# Patient Record
Sex: Female | Born: 1955 | Race: White | Hispanic: No | Smoking: Never smoker
Health system: Southern US, Community
[De-identification: ages and names within clinical notes are randomized; demographics above are authoritative.]

## PROBLEM LIST (undated history)

## (undated) DIAGNOSIS — K589 Irritable bowel syndrome without diarrhea: Secondary | ICD-10-CM

## (undated) DIAGNOSIS — T7840XA Allergy, unspecified, initial encounter: Secondary | ICD-10-CM

## (undated) DIAGNOSIS — Z460 Encounter for fitting and adjustment of spectacles and contact lenses: Secondary | ICD-10-CM

## (undated) DIAGNOSIS — G4733 Obstructive sleep apnea (adult) (pediatric): Secondary | ICD-10-CM

## (undated) DIAGNOSIS — Z9889 Other specified postprocedural states: Secondary | ICD-10-CM

## (undated) DIAGNOSIS — R112 Nausea with vomiting, unspecified: Secondary | ICD-10-CM

## (undated) DIAGNOSIS — E559 Vitamin D deficiency, unspecified: Secondary | ICD-10-CM

## (undated) DIAGNOSIS — K5790 Diverticulosis of intestine, part unspecified, without perforation or abscess without bleeding: Secondary | ICD-10-CM

## (undated) DIAGNOSIS — E785 Hyperlipidemia, unspecified: Secondary | ICD-10-CM

## (undated) DIAGNOSIS — Z79899 Other long term (current) drug therapy: Secondary | ICD-10-CM

## (undated) DIAGNOSIS — G629 Polyneuropathy, unspecified: Secondary | ICD-10-CM

## (undated) DIAGNOSIS — I10 Essential (primary) hypertension: Secondary | ICD-10-CM

## (undated) DIAGNOSIS — F419 Anxiety disorder, unspecified: Secondary | ICD-10-CM

## (undated) DIAGNOSIS — E790 Hyperuricemia without signs of inflammatory arthritis and tophaceous disease: Secondary | ICD-10-CM

## (undated) DIAGNOSIS — D126 Benign neoplasm of colon, unspecified: Secondary | ICD-10-CM

## (undated) DIAGNOSIS — K219 Gastro-esophageal reflux disease without esophagitis: Secondary | ICD-10-CM

## (undated) DIAGNOSIS — R7309 Other abnormal glucose: Secondary | ICD-10-CM

## (undated) HISTORY — DX: Other long term (current) drug therapy: Z79.899

## (undated) HISTORY — DX: Irritable bowel syndrome without diarrhea: K58.9

## (undated) HISTORY — PX: WISDOM TOOTH EXTRACTION: SHX21

## (undated) HISTORY — DX: Obstructive sleep apnea (adult) (pediatric): G47.33

## (undated) HISTORY — DX: Hyperlipidemia, unspecified: E78.5

## (undated) HISTORY — DX: Diverticulosis of intestine, part unspecified, without perforation or abscess without bleeding: K57.90

## (undated) HISTORY — DX: Allergy, unspecified, initial encounter: T78.40XA

## (undated) HISTORY — DX: Polyneuropathy, unspecified: G62.9

## (undated) HISTORY — DX: Vitamin D deficiency, unspecified: E55.9

## (undated) HISTORY — PX: FOOT SURGERY: SHX648

## (undated) HISTORY — PX: POLYPECTOMY: SHX149

## (undated) HISTORY — DX: Hyperuricemia without signs of inflammatory arthritis and tophaceous disease: E79.0

## (undated) HISTORY — DX: Essential (primary) hypertension: I10

## (undated) HISTORY — DX: Gastro-esophageal reflux disease without esophagitis: K21.9

## (undated) HISTORY — PX: COLONOSCOPY: SHX174

## (undated) HISTORY — DX: Anxiety disorder, unspecified: F41.9

## (undated) HISTORY — DX: Other abnormal glucose: R73.09

## (undated) HISTORY — PX: EYE SURGERY: SHX253

## (undated) HISTORY — PX: OTHER SURGICAL HISTORY: SHX169

## (undated) HISTORY — DX: Benign neoplasm of colon, unspecified: D12.6

---

## 1997-11-30 ENCOUNTER — Encounter: Admission: RE | Admit: 1997-11-30 | Discharge: 1998-02-28 | Payer: Self-pay | Admitting: Internal Medicine

## 1999-03-12 ENCOUNTER — Other Ambulatory Visit: Admission: RE | Admit: 1999-03-12 | Discharge: 1999-03-12 | Payer: Self-pay | Admitting: Obstetrics and Gynecology

## 1999-07-23 ENCOUNTER — Encounter: Admission: RE | Admit: 1999-07-23 | Discharge: 1999-07-23 | Payer: Self-pay | Admitting: Obstetrics and Gynecology

## 1999-07-23 ENCOUNTER — Encounter: Payer: Self-pay | Admitting: Obstetrics and Gynecology

## 1999-10-21 ENCOUNTER — Encounter: Payer: Self-pay | Admitting: Internal Medicine

## 1999-10-21 ENCOUNTER — Ambulatory Visit (HOSPITAL_COMMUNITY): Admission: RE | Admit: 1999-10-21 | Discharge: 1999-10-21 | Payer: Self-pay | Admitting: Internal Medicine

## 2000-03-12 ENCOUNTER — Other Ambulatory Visit: Admission: RE | Admit: 2000-03-12 | Discharge: 2000-03-12 | Payer: Self-pay | Admitting: Obstetrics and Gynecology

## 2000-07-13 ENCOUNTER — Encounter: Admission: RE | Admit: 2000-07-13 | Discharge: 2000-07-13 | Payer: Self-pay | Admitting: Obstetrics and Gynecology

## 2000-07-13 ENCOUNTER — Encounter: Payer: Self-pay | Admitting: Obstetrics and Gynecology

## 2000-07-22 ENCOUNTER — Encounter: Payer: Self-pay | Admitting: Obstetrics and Gynecology

## 2000-07-22 ENCOUNTER — Encounter: Admission: RE | Admit: 2000-07-22 | Discharge: 2000-07-22 | Payer: Self-pay | Admitting: Obstetrics and Gynecology

## 2001-03-16 ENCOUNTER — Other Ambulatory Visit: Admission: RE | Admit: 2001-03-16 | Discharge: 2001-03-16 | Payer: Self-pay | Admitting: Obstetrics and Gynecology

## 2001-03-31 ENCOUNTER — Emergency Department (HOSPITAL_COMMUNITY): Admission: EM | Admit: 2001-03-31 | Discharge: 2001-03-31 | Payer: Self-pay | Admitting: Emergency Medicine

## 2001-08-24 ENCOUNTER — Ambulatory Visit (HOSPITAL_COMMUNITY): Admission: RE | Admit: 2001-08-24 | Discharge: 2001-08-24 | Payer: Self-pay | Admitting: Anesthesiology

## 2001-08-24 ENCOUNTER — Encounter: Payer: Self-pay | Admitting: Internal Medicine

## 2002-06-15 ENCOUNTER — Other Ambulatory Visit: Admission: RE | Admit: 2002-06-15 | Discharge: 2002-06-15 | Payer: Self-pay | Admitting: Obstetrics and Gynecology

## 2002-07-21 ENCOUNTER — Encounter: Admission: RE | Admit: 2002-07-21 | Discharge: 2002-07-21 | Payer: Self-pay | Admitting: Obstetrics and Gynecology

## 2002-07-21 ENCOUNTER — Encounter: Payer: Self-pay | Admitting: Obstetrics and Gynecology

## 2003-09-12 ENCOUNTER — Encounter: Admission: RE | Admit: 2003-09-12 | Discharge: 2003-09-12 | Payer: Self-pay | Admitting: Obstetrics and Gynecology

## 2003-09-20 ENCOUNTER — Other Ambulatory Visit: Admission: RE | Admit: 2003-09-20 | Discharge: 2003-09-20 | Payer: Self-pay | Admitting: Obstetrics and Gynecology

## 2004-01-14 ENCOUNTER — Emergency Department (HOSPITAL_COMMUNITY): Admission: EM | Admit: 2004-01-14 | Discharge: 2004-01-14 | Payer: Self-pay | Admitting: Emergency Medicine

## 2004-08-20 ENCOUNTER — Encounter: Admission: RE | Admit: 2004-08-20 | Discharge: 2004-08-20 | Payer: Self-pay | Admitting: Obstetrics and Gynecology

## 2005-05-13 ENCOUNTER — Other Ambulatory Visit: Admission: RE | Admit: 2005-05-13 | Discharge: 2005-05-13 | Payer: Self-pay | Admitting: Obstetrics and Gynecology

## 2005-08-26 ENCOUNTER — Ambulatory Visit: Payer: Self-pay | Admitting: Gastroenterology

## 2005-09-09 ENCOUNTER — Ambulatory Visit: Payer: Self-pay | Admitting: Gastroenterology

## 2005-10-01 ENCOUNTER — Ambulatory Visit: Payer: Self-pay | Admitting: Hematology & Oncology

## 2005-10-10 LAB — CBC WITH DIFFERENTIAL/PLATELET
BASO%: 0.5 % (ref 0.0–2.0)
EOS%: 1.4 % (ref 0.0–7.0)
HGB: 13.1 g/dL (ref 11.6–15.9)
MCHC: 35.5 g/dL (ref 32.0–36.0)
NEUT%: 47.8 % (ref 39.6–76.8)
RBC: 3.98 10*6/uL (ref 3.70–5.32)
WBC: 12.5 10*3/uL — ABNORMAL HIGH (ref 3.9–10.0)
lymph#: 5.4 10*3/uL — ABNORMAL HIGH (ref 0.9–3.3)

## 2005-10-10 LAB — CHCC SMEAR

## 2005-10-15 ENCOUNTER — Encounter: Admission: RE | Admit: 2005-10-15 | Discharge: 2005-10-15 | Payer: Self-pay | Admitting: Obstetrics and Gynecology

## 2005-10-21 ENCOUNTER — Encounter: Admission: RE | Admit: 2005-10-21 | Discharge: 2005-10-21 | Payer: Self-pay | Admitting: Obstetrics and Gynecology

## 2005-11-07 ENCOUNTER — Encounter: Admission: RE | Admit: 2005-11-07 | Discharge: 2005-11-07 | Payer: Self-pay | Admitting: Internal Medicine

## 2006-03-16 ENCOUNTER — Ambulatory Visit: Payer: Self-pay | Admitting: Gastroenterology

## 2006-10-29 ENCOUNTER — Encounter: Admission: RE | Admit: 2006-10-29 | Discharge: 2006-10-29 | Payer: Self-pay | Admitting: Obstetrics and Gynecology

## 2006-11-24 ENCOUNTER — Ambulatory Visit (HOSPITAL_COMMUNITY): Admission: RE | Admit: 2006-11-24 | Discharge: 2006-11-24 | Payer: Self-pay | Admitting: Internal Medicine

## 2006-12-02 ENCOUNTER — Encounter: Admission: RE | Admit: 2006-12-02 | Discharge: 2006-12-02 | Payer: Self-pay | Admitting: Internal Medicine

## 2006-12-08 ENCOUNTER — Ambulatory Visit: Payer: Self-pay | Admitting: Gastroenterology

## 2006-12-08 LAB — CONVERTED CEMR LAB: Folate: >20 ng/mL

## 2006-12-09 ENCOUNTER — Encounter: Payer: Self-pay | Admitting: Gastroenterology

## 2006-12-09 LAB — CONVERTED CEMR LAB: Tissue Transglutaminase Ab, IgA: 0.5 units (ref <7)

## 2007-10-18 DIAGNOSIS — K589 Irritable bowel syndrome without diarrhea: Secondary | ICD-10-CM | POA: Insufficient documentation

## 2007-10-18 DIAGNOSIS — Z86010 Personal history of colonic polyps: Secondary | ICD-10-CM | POA: Insufficient documentation

## 2007-10-18 DIAGNOSIS — D126 Benign neoplasm of colon, unspecified: Secondary | ICD-10-CM

## 2007-10-18 HISTORY — DX: Irritable bowel syndrome, unspecified: K58.9

## 2007-10-18 HISTORY — DX: Benign neoplasm of colon, unspecified: D12.6

## 2007-11-25 ENCOUNTER — Encounter: Admission: RE | Admit: 2007-11-25 | Discharge: 2007-11-25 | Payer: Self-pay | Admitting: Obstetrics and Gynecology

## 2008-07-07 ENCOUNTER — Ambulatory Visit: Payer: Self-pay | Admitting: Hematology & Oncology

## 2008-07-21 LAB — CBC WITH DIFFERENTIAL (CANCER CENTER ONLY)
BASO#: 0.1 10*3/uL (ref 0.0–0.2)
BASO%: 1 % (ref 0.0–2.0)
EOS%: 2.7 % (ref 0.0–7.0)
HGB: 12.3 g/dL (ref 11.6–15.9)
LYMPH#: 5 10*3/uL — ABNORMAL HIGH (ref 0.9–3.3)
MCH: 31.8 pg (ref 26.0–34.0)
MCHC: 35.2 g/dL (ref 32.0–36.0)
MONO%: 4.3 % (ref 0.0–13.0)
NEUT#: 5.8 10*3/uL (ref 1.5–6.5)
NEUT%: 49.6 % (ref 39.6–80.0)
RDW: 10.9 % (ref 10.5–14.6)

## 2008-07-21 LAB — CHCC SATELLITE - SMEAR

## 2008-07-21 LAB — TECHNOLOGIST REVIEW CHCC SATELLITE

## 2008-07-27 LAB — JAK2 GENOTYPR

## 2008-09-11 ENCOUNTER — Ambulatory Visit: Payer: Self-pay | Admitting: Hematology & Oncology

## 2008-11-21 ENCOUNTER — Ambulatory Visit (HOSPITAL_COMMUNITY): Admission: RE | Admit: 2008-11-21 | Discharge: 2008-11-21 | Payer: Self-pay | Admitting: Obstetrics and Gynecology

## 2008-11-27 ENCOUNTER — Encounter: Admission: RE | Admit: 2008-11-27 | Discharge: 2008-11-27 | Payer: Self-pay | Admitting: Obstetrics and Gynecology

## 2009-12-03 ENCOUNTER — Encounter: Admission: RE | Admit: 2009-12-03 | Discharge: 2009-12-03 | Payer: Self-pay | Admitting: Obstetrics and Gynecology

## 2010-07-22 ENCOUNTER — Other Ambulatory Visit: Payer: Self-pay | Admitting: Obstetrics and Gynecology

## 2010-07-22 DIAGNOSIS — N6310 Unspecified lump in the right breast, unspecified quadrant: Secondary | ICD-10-CM

## 2010-07-30 ENCOUNTER — Other Ambulatory Visit: Payer: Self-pay | Admitting: Obstetrics and Gynecology

## 2010-07-30 ENCOUNTER — Ambulatory Visit
Admission: RE | Admit: 2010-07-30 | Discharge: 2010-07-30 | Disposition: A | Discharge status: Home / Self Care | Payer: 59 | Source: Ambulatory Visit | Attending: Obstetrics and Gynecology | Admitting: Obstetrics and Gynecology

## 2010-07-30 DIAGNOSIS — N6310 Unspecified lump in the right breast, unspecified quadrant: Secondary | ICD-10-CM

## 2010-11-05 NOTE — Assessment & Plan Note (Signed)
Katie Cox                         GASTROENTEROLOGY OFFICE NOTE   Katie Cox, Katie Cox                       MRN:          [id number]  DATE:12/08/2006                            DOB:          05-Jun-1956    Katie Cox is a 55 year old white female with chronic IBS who has been  seeing Dr. Corinda Gubler for close to 20 years.  Her last colonoscopy exam was  in March of 2007, and she did have a somewhat tortuous colon.   Her main complaint is one of excessive abdominal gas and bloating,  alternating diarrhea and constipation.  She had an inadequate trial of  Xifaxan in September of 2007 with 200 mg twice a day with no response.  She does have excessive flatus, but denies true reflux symptoms.  She  recently had upper GI series and ultrasound performed by Dr. Oneta Rack  that were negative.   She has had no anorexia, weight loss, but does complain of early  satiety, gas, and bloating.  She currently is having some loose, non-  bloody stools.  She has been on chronic estrogen therapy which has  recently been manipulated, and she has recently been placed on Nexium.  She also is on a diuretic, takes potassium supplementation.   I cannot see in reviewing her chart where she has ever had endoscopy or  small bowel biopsy.  She has had previous colon polyps resected in 2004.  She does have known lactose intolerance.   She denies any hepatobiliary complaints, anorexia, weight loss, or  systemic complaints such as fever, chills, skin rashes, joint pains, or  oral stomatitis.  She denies abuse of cigarettes, alcohol, or NSAIDs.   FAMILY HISTORY:  Noncontributory.   PHYSICAL EXAMINATION:  She is a healthy-appearing white female in no  distress appearing her stated age.  She weighs 185 pounds which is up some 10 pounds from her normal weight.  Blood pressure is 128/82 and pulse was 88 and regular.  I could not appreciate stigmata of chronic liver disease.  Her  abdominal exam was entirely benign without organomegaly, masses,  tenderness, or distension.  Bowel sounds were present, were  nonobstructive in quality.   ASSESSMENT:  I think Katie Cox has chronic irritable bowel syndrome,  but certainly may have a bacterial overgrowth component with inadequate  previous treatment.  She obviously has lactose intolerance.  I think we  need to screen her for celiac disease, especially since her family is  [place] in origin.   RECOMMENDATIONS:  1. Repeat Xifaxan 400 mg t.i.d. for 10 days along with Align probiotic      therapy.  2. Check celiac panel, carotene, and B12 level.  3. Continue other medications as per Dr. Oneta Rack.  4. Consider endoscopy, small bowel biopsy.     Katie Rea. Jarold Motto, MD, Caleen Essex, FAGA  Electronically Signed    DRP/MedQ  DD: 12/08/2006  DT: 12/08/2006  Job #: [id number]   cc:   Lucky Cowboy, M.D.

## 2010-11-08 NOTE — Assessment & Plan Note (Signed)
[facility]                           GASTROENTEROLOGY OFFICE NOTE   Katie Cox, Katie Cox                       MRN:          [id number]  DATE:03/16/2006                            DOB:          [birth year]    Leandrea comes in on September 24 as referred by Dr. Oneta Rack.  She is  complaining predominantly of some bloating and gas.  She states she has been  treated with some increased fiber and antispasmodics, but this has not  seemed to help.  She is lactose intolerant.  Has cut back on her lactose  usage.  I ask about sugar-free candy.  She does not really do this.  She  says she has some increasing constipation and has difficulty going to the  bathroom, and this is bothering her as well.  I did a colonoscopic  examination on her in March of 2007 because of her family history of her  father having colon cancer in his 30s.  The colonoscope examination revealed  only some minimal diverticula and otherwise was totally normal.   Otherwise, the patient does not complain of any other GI symptoms.  She has  had no blood in her stools.  Had no weight loss or anything else of  significance.   PAST MEDICAL HISTORY:  Only reveals some thyroid problems in the past, for  which she is treated by Dr. Oneta Rack.   SOCIAL HISTORY:  Negative.  She does not drink or smoke.  She works as an  Statistician.   REVIEW OF SYSTEMS:  Symptoms of fatigue and sleeping problems, and  occasional swelling of her legs.   PHYSICAL EXAMINATION:  She is 5 feet 8 inches.  Weighs 176.  Blood pressure  124/84, pulse 60 and regular.  NECK, HEART, ABDOMEN, EXTREMITIES:  Unremarkable except she did have some  slight tenderness to palpation of the left and right lower quadrants, but  nothing of great significance.  No bruits or rubs.  Inguinal area was  negative.   IMPRESSION:  1. Probably irritable bowel with a constipation component.  2. Status post thyroid surgery.  3. Mild  obesity.   RECOMMENDATIONS:  The patient to use Senokot with senna 2 at bedtime and 5  prunes in the morning.  I gave her some Xifaxan 200 mg take 1 b.i.d. for 10  days.  I gave her some samples.  Some Align should be taken along with it 1  a day.  Gave her some literature on gas and lactose-free diet and told her  to use some MiraLax if needed to get her bowels to move  every day.  I told her to return in 4-6 weeks if she is no better.  I think  a CT scan of her abdomen to be complete would be in order.                                   Ulyess Mort, MD   SML/MedQ  DD:  03/16/2006  DT:  03/18/2006  Job #:  [id number]   cc:   Lucky Cowboy, M.D.

## 2010-11-20 ENCOUNTER — Other Ambulatory Visit: Payer: Self-pay | Admitting: Obstetrics and Gynecology

## 2010-11-20 DIAGNOSIS — Z1231 Encounter for screening mammogram for malignant neoplasm of breast: Secondary | ICD-10-CM

## 2010-12-09 ENCOUNTER — Ambulatory Visit: Payer: 59

## 2010-12-20 ENCOUNTER — Ambulatory Visit: Payer: 59

## 2010-12-23 ENCOUNTER — Ambulatory Visit
Admission: RE | Admit: 2010-12-23 | Discharge: 2010-12-23 | Disposition: A | Discharge status: Home / Self Care | Payer: 59 | Source: Ambulatory Visit | Attending: Obstetrics and Gynecology | Admitting: Obstetrics and Gynecology

## 2010-12-23 DIAGNOSIS — Z1231 Encounter for screening mammogram for malignant neoplasm of breast: Secondary | ICD-10-CM

## 2011-11-25 ENCOUNTER — Other Ambulatory Visit: Payer: Self-pay | Admitting: Obstetrics and Gynecology

## 2011-11-25 DIAGNOSIS — Z1231 Encounter for screening mammogram for malignant neoplasm of breast: Secondary | ICD-10-CM

## 2011-12-31 ENCOUNTER — Ambulatory Visit: Payer: 59

## 2012-01-05 ENCOUNTER — Ambulatory Visit: Payer: 59

## 2012-01-16 ENCOUNTER — Ambulatory Visit
Admission: RE | Admit: 2012-01-16 | Discharge: 2012-01-16 | Disposition: A | Discharge status: Home / Self Care | Payer: 59 | Source: Ambulatory Visit | Attending: Obstetrics and Gynecology | Admitting: Obstetrics and Gynecology

## 2012-01-16 DIAGNOSIS — Z1231 Encounter for screening mammogram for malignant neoplasm of breast: Secondary | ICD-10-CM

## 2012-03-25 ENCOUNTER — Other Ambulatory Visit: Payer: Self-pay | Admitting: Internal Medicine

## 2012-03-25 DIAGNOSIS — R109 Unspecified abdominal pain: Secondary | ICD-10-CM

## 2012-04-01 ENCOUNTER — Ambulatory Visit
Admission: RE | Admit: 2012-04-01 | Discharge: 2012-04-01 | Disposition: A | Discharge status: Home / Self Care | Payer: 59 | Source: Ambulatory Visit | Attending: Internal Medicine | Admitting: Internal Medicine

## 2012-04-01 DIAGNOSIS — R109 Unspecified abdominal pain: Secondary | ICD-10-CM

## 2012-04-05 ENCOUNTER — Encounter (INDEPENDENT_AMBULATORY_CARE_PROVIDER_SITE_OTHER): Payer: Self-pay | Admitting: General Surgery

## 2012-04-06 ENCOUNTER — Ambulatory Visit (INDEPENDENT_AMBULATORY_CARE_PROVIDER_SITE_OTHER): Payer: 59 | Admitting: General Surgery

## 2012-04-06 ENCOUNTER — Encounter (INDEPENDENT_AMBULATORY_CARE_PROVIDER_SITE_OTHER): Payer: Self-pay | Admitting: General Surgery

## 2012-04-06 VITALS — BP 134/86 | HR 68 | Temp 97.3°F | Resp 14 | Ht 68.0 in | Wt 155.2 lb

## 2012-04-06 DIAGNOSIS — K8020 Calculus of gallbladder without cholecystitis without obstruction: Secondary | ICD-10-CM

## 2012-04-06 NOTE — Progress Notes (Signed)
Patient ID: Katie Cox, female   DOB: 12/04/1955, 56 y.o.   MRN: [id number]  Chief Complaint  Patient presents with  . Abdominal Pain    HPI Katie Cox is a 56 y.o. female.  Referred by Dr. McKeown HPI This is a 56-year-old female who is otherwise healthy. 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 Mostly this is constipation some occasional abdominal pain. She reports recently she began to develop constant epigastric, right upper quadrant pain associated with nausea this occurs all the time it occasionally radiates to her back and up into the right side of her chest. She has no emesis and describes nausea. She had no diarrhea associated with this. He makes her more nauseated and does worsen the pain. She also complains of some bloating and some tenderness in bilateral lower quadrants of her abdomen as well. There is been no changes in her bowel movements. She reports that she has a prior history of a colonoscopy that was without any abnormalities. She has undergone an ultrasound after developing the symptoms which shows the gallbladder having multiple gallstones. The remainder of this is normal. Her liver function tests I have today are also within normal limits. She comes in to discuss treatment for what sounds to be symptomatic cholelithiasis. Past Medical History  Diagnosis Date  . Hypertension   . Gallstones   . GERD (gastroesophageal reflux disease)   . Hyperlipidemia     Past Surgical History  Procedure Date  . Goiter removed fro thyroid   . Foot surgery     Family History  Problem Relation Age of Onset  . Breast cancer Maternal Aunt   . Colon cancer Father   . Heart disease Mother     Social History History  Substance Use Topics  . Smoking status: Never Smoker   . Smokeless tobacco: Not on file  . Alcohol Use: No    Allergies  Allergen Reactions  . Alprazolam   . Prednisone     Only high doses    Current Outpatient Prescriptions    Medication Sig Dispense Refill  . aspirin 81 MG tablet Take 81 mg by mouth daily.      . bumetanide (BUMEX) 2 MG tablet Take 2 mg by mouth daily.      . Cholecalciferol (VITAMIN D PO) Take by mouth.      . Cyanocobalamin (B-12 PO) Take by mouth.      . magnesium 30 MG tablet Take 30 mg by mouth 2 (two) times daily.      . Multiple Vitamin (MULTIVITAMIN) tablet Take 1 tablet by mouth daily.      . potassium chloride (KLOR-CON) 20 MEQ packet Take 20 mEq by mouth 2 (two) times daily.      . ranitidine (ZANTAC) 300 MG tablet Take 300 mg by mouth at bedtime.        Review of Systems Review of Systems  Constitutional: Negative for fever, chills and unexpected weight change.  HENT: Negative for hearing loss, congestion, sore throat, trouble swallowing and voice change.   Eyes: Negative for visual disturbance.  Respiratory: Negative for cough and wheezing.   Cardiovascular: Negative for chest pain, palpitations and leg swelling.  Gastrointestinal: Positive for nausea, abdominal pain and constipation. Negative for vomiting, diarrhea, blood in stool, abdominal distention and anal bleeding.  Genitourinary: Negative for hematuria, vaginal bleeding and difficulty urinating.  Musculoskeletal: Negative for arthralgias.  Skin: Negative for rash and wound.  Neurological: Negative for seizures,   syncope and headaches.  Hematological: Negative for adenopathy. Does not bruise/bleed easily.  Psychiatric/Behavioral: Negative for confusion.    Blood pressure 134/86, pulse 68, temperature 97.3 F (36.3 C), resp. rate 14, height 5' 8" (1.727 m), weight 155 lb 3.2 oz (70.398 kg).  Physical Exam Physical Exam  Vitals reviewed. Constitutional: She appears well-developed and well-nourished.  Eyes: No scleral icterus.  Cardiovascular: Normal rate, regular rhythm and normal heart sounds.   Pulmonary/Chest: Effort normal and breath sounds normal. She has no wheezes. She has no rales.  Abdominal: Soft. Normal  appearance. There is tenderness in the right upper quadrant, right lower quadrant and left lower quadrant. No hernia.  Lymphadenopathy:    She has no cervical adenopathy.    Data Reviewed History: Post prandial nausea and pain  Findings: Gallbladder is visualized in multiple projections.  Within the gallbladder, there are multiple echogenic foci which  move and shadow consistent with gallstones. There is no  gallbladder wall thickening or pericholecystic fluid collection.  There is no intrahepatic, common hepatic, or common bile duct  dilatation. Pancreas is largely obscured by gas. The small  portion of the pancreas which are visualized appear grossly normal.  No focal liver lesions are identified.  Spleen is normal in size and homogeneous echotexture. Kidneys  appear somewhat echogenic bilaterally but otherwise appear normal.  There is no ascites. There is atherosclerotic change in the aorta  but no aneurysm. Inferior vena cava appears of normal.  Conclusion: Cholelithiasis.  Pancreas largely obscured by gas.  Atherosclerotic change in the aorta.  Kidneys appear somewhat echogenic bilaterally. Question underlying  medical renal disease. Kidneys otherwise appear normal.  Study otherwise unremarkable.   Assessment    Symptomatic cholelithiasis    Plan    I discussed the procedure in detail.    We discussed the risks and benefits of a laparoscopic cholecystectomy and possible cholangiogram including, but not limited to bleeding, infection, injury to surrounding structures such as the intestine or liver, bile leak, retained gallstones, need to convert to an open procedure, prolonged diarrhea, blood clots such as  DVT, common bile duct injury, anesthesia risks, and possible need for additional procedures.  The likelihood of improvement in symptoms and return to the patient's normal status is good. We discussed the typical post-operative recovery course.         Katie Cox 04/06/2012, 3:19 PM    

## 2012-04-08 ENCOUNTER — Encounter (HOSPITAL_BASED_OUTPATIENT_CLINIC_OR_DEPARTMENT_OTHER): Payer: Self-pay | Admitting: *Deleted

## 2012-04-08 NOTE — Progress Notes (Signed)
To come in for [facility] labs-had ekg at pcp-will get copy

## 2012-04-09 ENCOUNTER — Encounter (HOSPITAL_BASED_OUTPATIENT_CLINIC_OR_DEPARTMENT_OTHER)
Admission: RE | Admit: 2012-04-09 | Discharge: 2012-04-09 | Disposition: A | Discharge status: Home / Self Care | Payer: 59 | Source: Ambulatory Visit | Attending: General Surgery | Admitting: General Surgery

## 2012-04-09 LAB — CBC WITH DIFFERENTIAL/PLATELET
Basophils Absolute: 0.1 10*3/uL (ref 0.0–0.1)
Basophils Relative: 1 % (ref 0–1)
Eosinophils Relative: 1 % (ref 0–5)
HCT: 41.4 % (ref 36.0–46.0)
MCHC: 36.7 g/dL — ABNORMAL HIGH (ref 30.0–36.0)
MCV: 90.2 fL (ref 78.0–100.0)
Monocytes Absolute: 0.8 10*3/uL (ref 0.1–1.0)
RDW: 12.3 % (ref 11.5–15.5)

## 2012-04-09 LAB — COMPREHENSIVE METABOLIC PANEL
AST: 30 U/L (ref 0–37)
Albumin: 4.4 g/dL (ref 3.5–5.2)
Calcium: 10 mg/dL (ref 8.4–10.5)
Creatinine, Ser: 0.93 mg/dL (ref 0.50–1.10)
Total Protein: 8.2 g/dL (ref 6.0–8.3)

## 2012-04-14 ENCOUNTER — Encounter (HOSPITAL_BASED_OUTPATIENT_CLINIC_OR_DEPARTMENT_OTHER): Payer: Self-pay | Admitting: Certified Registered Nurse Anesthetist

## 2012-04-14 ENCOUNTER — Ambulatory Visit (HOSPITAL_BASED_OUTPATIENT_CLINIC_OR_DEPARTMENT_OTHER): Payer: 59 | Admitting: Certified Registered Nurse Anesthetist

## 2012-04-14 ENCOUNTER — Encounter (HOSPITAL_BASED_OUTPATIENT_CLINIC_OR_DEPARTMENT_OTHER)
Admission: RE | Disposition: A | Discharge status: Home / Self Care | Payer: Self-pay | Source: Ambulatory Visit | Attending: General Surgery

## 2012-04-14 ENCOUNTER — Ambulatory Visit (HOSPITAL_BASED_OUTPATIENT_CLINIC_OR_DEPARTMENT_OTHER)
Admission: RE | Admit: 2012-04-14 | Discharge: 2012-04-14 | Disposition: A | Discharge status: Home / Self Care | Payer: 59 | Source: Ambulatory Visit | Attending: General Surgery | Admitting: General Surgery

## 2012-04-14 ENCOUNTER — Encounter (HOSPITAL_BASED_OUTPATIENT_CLINIC_OR_DEPARTMENT_OTHER): Payer: Self-pay

## 2012-04-14 DIAGNOSIS — K8010 Calculus of gallbladder with chronic cholecystitis without obstruction: Secondary | ICD-10-CM | POA: Insufficient documentation

## 2012-04-14 DIAGNOSIS — Z01812 Encounter for preprocedural laboratory examination: Secondary | ICD-10-CM | POA: Insufficient documentation

## 2012-04-14 DIAGNOSIS — E785 Hyperlipidemia, unspecified: Secondary | ICD-10-CM | POA: Insufficient documentation

## 2012-04-14 DIAGNOSIS — K219 Gastro-esophageal reflux disease without esophagitis: Secondary | ICD-10-CM | POA: Insufficient documentation

## 2012-04-14 HISTORY — PX: CHOLECYSTECTOMY: SHX55

## 2012-04-14 HISTORY — DX: Encounter for fitting and adjustment of spectacles and contact lenses: Z46.0

## 2012-04-14 SURGERY — LAPAROSCOPIC CHOLECYSTECTOMY
Anesthesia: General | Site: Abdomen | Wound class: Contaminated

## 2012-04-14 MED ORDER — LACTATED RINGERS IV SOLN
INTRAVENOUS | Status: DC
  Administered 2012-04-14 (×2): via INTRAVENOUS

## 2012-04-14 MED ORDER — KETOROLAC TROMETHAMINE 30 MG/ML IJ SOLN
INTRAMUSCULAR | Status: DC | PRN
  Administered 2012-04-14: 30 mg via INTRAVENOUS

## 2012-04-14 MED ORDER — ACETAMINOPHEN 10 MG/ML IV SOLN
1000.0000 mg | Freq: Once | INTRAVENOUS | Status: AC
  Administered 2012-04-14: 1000 mg via INTRAVENOUS

## 2012-04-14 MED ORDER — SODIUM CHLORIDE 0.9 % IR SOLN
Status: DC | PRN
  Administered 2012-04-14: 1

## 2012-04-14 MED ORDER — MIDAZOLAM HCL 5 MG/5ML IJ SOLN
INTRAMUSCULAR | Status: DC | PRN
  Administered 2012-04-14: 1 mg via INTRAVENOUS

## 2012-04-14 MED ORDER — GLYCOPYRROLATE 0.2 MG/ML IJ SOLN
INTRAMUSCULAR | Status: DC | PRN
  Administered 2012-04-14: 0.6 mg via INTRAVENOUS

## 2012-04-14 MED ORDER — PROMETHAZINE HCL 25 MG/ML IJ SOLN
6.2500 mg | Freq: Four times a day (QID) | INTRAMUSCULAR | Status: DC | PRN
  Administered 2012-04-14: 6.25 mg via INTRAVENOUS

## 2012-04-14 MED ORDER — OXYCODONE HCL 5 MG/5ML PO SOLN: 5.0000 mg | Freq: Once | ORAL | Status: DC | PRN

## 2012-04-14 MED ORDER — NEOSTIGMINE METHYLSULFATE 1 MG/ML IJ SOLN
INTRAMUSCULAR | Status: DC | PRN
  Administered 2012-04-14: 3.5 mg via INTRAVENOUS

## 2012-04-14 MED ORDER — OXYCODONE HCL 5 MG PO TABS: 5.0000 mg | ORAL_TABLET | Freq: Once | ORAL | Status: DC | PRN

## 2012-04-14 MED ORDER — DEXTROSE 5 % IV SOLN
2.0000 g | INTRAVENOUS | Status: AC
  Administered 2012-04-14: 2 g via INTRAVENOUS

## 2012-04-14 MED ORDER — OXYCODONE-ACETAMINOPHEN 5-325 MG PO TABS: 1.0000 | ORAL_TABLET | ORAL | Status: DC | PRN

## 2012-04-14 MED ORDER — PROPOFOL 10 MG/ML IV BOLUS
INTRAVENOUS | Status: DC | PRN
  Administered 2012-04-14: 200 mg via INTRAVENOUS

## 2012-04-14 MED ORDER — BUPIVACAINE-EPINEPHRINE 0.25% -1:200000 IJ SOLN
INTRAMUSCULAR | Status: DC | PRN
  Administered 2012-04-14: 10 mL

## 2012-04-14 MED ORDER — ONDANSETRON HCL 4 MG/2ML IJ SOLN
INTRAMUSCULAR | Status: DC | PRN
  Administered 2012-04-14: 4 mg via INTRAVENOUS
  Administered 2012-04-14: 2 mg via INTRAVENOUS

## 2012-04-14 MED ORDER — SCOPOLAMINE 1 MG/3DAYS TD PT72
1.0000 | MEDICATED_PATCH | TRANSDERMAL | Status: DC
  Administered 2012-04-14: 1.5 mg via TRANSDERMAL

## 2012-04-14 MED ORDER — DEXAMETHASONE SODIUM PHOSPHATE 4 MG/ML IJ SOLN
INTRAMUSCULAR | Status: DC | PRN
  Administered 2012-04-14: 10 mg via INTRAVENOUS

## 2012-04-14 MED ORDER — ROCURONIUM BROMIDE 100 MG/10ML IV SOLN
INTRAVENOUS | Status: DC | PRN
  Administered 2012-04-14: 35 mg via INTRAVENOUS

## 2012-04-14 MED ORDER — FENTANYL CITRATE 0.05 MG/ML IJ SOLN
INTRAMUSCULAR | Status: DC | PRN
  Administered 2012-04-14: 50 ug via INTRAVENOUS

## 2012-04-14 MED ORDER — HYDROMORPHONE HCL PF 1 MG/ML IJ SOLN
0.2500 mg | INTRAMUSCULAR | Status: DC | PRN
  Administered 2012-04-14 (×2): 0.5 mg via INTRAVENOUS

## 2012-04-14 MED ORDER — LIDOCAINE HCL (CARDIAC) 20 MG/ML IV SOLN
INTRAVENOUS | Status: DC | PRN
  Administered 2012-04-14: 60 mg via INTRAVENOUS

## 2012-04-14 SURGICAL SUPPLY — 40 items
APPLIER CLIP 5 13 M/L LIGAMAX5 (MISCELLANEOUS) ×2
BLADE SURG ROTATE 9660 (MISCELLANEOUS) IMPLANT
CANISTER SUCTION 2500CC (MISCELLANEOUS) ×2 IMPLANT
CHLORAPREP W/TINT 26ML (MISCELLANEOUS) ×2 IMPLANT
CLIP APPLIE 5 13 M/L LIGAMAX5 (MISCELLANEOUS) ×1 IMPLANT
CLOTH BEACON ORANGE TIMEOUT ST (SAFETY) ×2 IMPLANT
COVER MAYO STAND STRL (DRAPES) IMPLANT
DECANTER SPIKE VIAL GLASS SM (MISCELLANEOUS) ×2 IMPLANT
DERMABOND ADVANCED (GAUZE/BANDAGES/DRESSINGS) ×1
DERMABOND ADVANCED .7 DNX12 (GAUZE/BANDAGES/DRESSINGS) ×1 IMPLANT
DRAPE C-ARM 42X72 X-RAY (DRAPES) IMPLANT
ELECT REM PT RETURN 9FT ADLT (ELECTROSURGICAL) ×2
ELECTRODE REM PT RTRN 9FT ADLT (ELECTROSURGICAL) ×1 IMPLANT
GLOVE BIO SURGEON STRL SZ7 (GLOVE) ×2 IMPLANT
GLOVE BIOGEL PI IND STRL 6.5 (GLOVE) IMPLANT
GLOVE BIOGEL PI IND STRL 7.0 (GLOVE) ×1 IMPLANT
GLOVE BIOGEL PI IND STRL 7.5 (GLOVE) ×1 IMPLANT
GLOVE BIOGEL PI INDICATOR 6.5 (GLOVE)
GLOVE BIOGEL PI INDICATOR 7.0 (GLOVE) ×1
GLOVE BIOGEL PI INDICATOR 7.5 (GLOVE) ×1
GLOVE ECLIPSE 6.5 STRL STRAW (GLOVE) ×2 IMPLANT
GLOVE SURG SS PI 7.0 STRL IVOR (GLOVE) ×4 IMPLANT
GOWN STRL NON-REIN LRG LVL3 (GOWN DISPOSABLE) ×8 IMPLANT
HEMOSTAT SNOW SURGICEL 2X4 (HEMOSTASIS) IMPLANT
NS IRRIG 1000ML POUR BTL (IV SOLUTION) ×2 IMPLANT
PACK BASIN DAY SURGERY FS (CUSTOM PROCEDURE TRAY) ×2 IMPLANT
POUCH SPECIMEN RETRIEVAL 10MM (ENDOMECHANICALS) ×2 IMPLANT
SCISSORS LAP 5X35 DISP (ENDOMECHANICALS) IMPLANT
SET CHOLANGIOGRAPH 5 50 .035 (SET/KITS/TRAYS/PACK) IMPLANT
SET IRRIG TUBING LAPAROSCOPIC (IRRIGATION / IRRIGATOR) ×2 IMPLANT
SLEEVE ENDOPATH XCEL 5M (ENDOMECHANICALS) ×4 IMPLANT
SLEEVE SCD COMPRESS KNEE MED (MISCELLANEOUS) ×2 IMPLANT
SPECIMEN JAR SMALL (MISCELLANEOUS) ×2 IMPLANT
SUT MNCRL AB 4-0 PS2 18 (SUTURE) ×2 IMPLANT
SUT VICRYL 0 UR6 27IN ABS (SUTURE) ×2 IMPLANT
TOWEL OR 17X24 6PK STRL BLUE (TOWEL DISPOSABLE) ×2 IMPLANT
TOWEL OR NON WOVEN STRL DISP B (DISPOSABLE) ×2 IMPLANT
TRAY LAPAROSCOPIC (CUSTOM PROCEDURE TRAY) ×2 IMPLANT
TROCAR XCEL BLUNT TIP 100MML (ENDOMECHANICALS) ×2 IMPLANT
TROCAR XCEL NON-BLD 5MMX100MML (ENDOMECHANICALS) ×2 IMPLANT

## 2012-04-14 NOTE — Op Note (Signed)
Preoperative diagnosis: Symptomatic cholelithiasis Postoperative diagnosis: Same as above Procedure: Laparoscopic cholecystectomy Surgeon: Dr. Harden Mo Anesthesia: Gen. Endotracheal Estimated blood loss: Minimal Complications: None Drains: None Specimens: Gallbladder and contents to pathology Sponge and needle count correct x2 at end of operation Disposition to recovery in stable condition  Indications: This is a 56 year old female who was noted to have abdominal pain and gallstones on ultrasound. This appeared to be related to symptomatic cholelithiasis. Her liver function tests were all within normal limits. She and I discussed a laparoscopic cholecystectomy and the risks and benefits associated with that.  Procedure: After informed consent was obtained the patient was taken to the operative room. She was administered 2 g of intravenous cefoxitin. She had sequential compression devices placed on her legs prior to beginning. She was then placed under general endotracheal anesthesia without complication. Her abdomen was then prepped and draped in the standard sterile surgical fashion. A surgical timeout was then performed.  I infiltrated Marcaine below her umbilicus. I then made a vertical incision. I grasped her fascia and entered sharply. Her peritoneum was entered bluntly. I then placed the 0 Vicryl pursestring suture to the fascia. I inserted a Hassan trocar and insufflated the abdomen to 15 mm mercury pressure. I then inserted 3 further 5 mm trocars in the epigastrium and right abdomen under direct vision after infiltration with local anesthetic without complication. I then retracted the gallbladder cephalad and lateral. I dissected out her triangle and clearly obtained the critical view of safety. I then clipped the duct and artery and divided them. I left 2 clips in position on both of these. I then removed the gallbladder from the liver bed. This was somewhat adherent and I did spill a  small amount of bile during this. The gallbladder was then removed from the liver bed. It was placed in an Endo Catch bag and removed from the umbilicus. Irrigation was performed until this was clear. Hemostasis was obtained. I then removed the Hasson trocar and tied down my umbilical stitch. This completely obliterated the defect. There was no evidence of an entry injury. I then desufflated the abdomen removed all trocars. I closed these with 4 Monocryl, and Dermabond, and Steri-Strips. She tolerated this well was extubated and transferred to the recovery room in stable condition.

## 2012-04-14 NOTE — Anesthesia Preprocedure Evaluation (Signed)
Anesthesia Evaluation  Patient identified by MRN, date of birth, ID band Patient awake    Reviewed: Allergy & Precautions, H&P , NPO status , Patient's Chart, lab work & pertinent test results  Airway Mallampati: II TM Distance: >3 FB Neck ROM: Full    Dental No notable dental hx. (+) Teeth Intact and Dental Advisory Given   Pulmonary neg pulmonary ROS,    Pulmonary exam normal       Cardiovascular hypertension, On Medications     Neuro/Psych negative neurological ROS  negative psych ROS   GI/Hepatic Neg liver ROS, GERD-  Medicated,  Endo/Other  negative endocrine ROS  Renal/GU negative Renal ROS  negative genitourinary   Musculoskeletal   Abdominal   Peds  Hematology negative hematology ROS (+)   Anesthesia Other Findings   Reproductive/Obstetrics negative OB ROS                           Anesthesia Physical Anesthesia Plan  ASA: II  Anesthesia Plan: General   Post-op Pain Management:    Induction: Intravenous  Airway Management Planned: Oral ETT  Additional Equipment:   Intra-op Plan:   Post-operative Plan: Extubation in OR  Informed Consent: I have reviewed the patients History and Physical, chart, labs and discussed the procedure including the risks, benefits and alternatives for the proposed anesthesia with the patient or authorized representative who has indicated his/her understanding and acceptance.   Dental advisory given  Plan Discussed with: CRNA and Surgeon  Anesthesia Plan Comments:         Anesthesia Quick Evaluation

## 2012-04-14 NOTE — Anesthesia Procedure Notes (Signed)
Procedure Name: Intubation Date/Time: 04/14/2012 8:46 AM Performed by: Dacey Milberger D Pre-anesthesia Checklist: Patient identified, Emergency Drugs available, Suction available and Patient being monitored Patient Re-evaluated:Patient Re-evaluated prior to inductionOxygen Delivery Method: Circle System Utilized Preoxygenation: Pre-oxygenation with 100% oxygen Intubation Type: IV induction Ventilation: Mask ventilation without difficulty Laryngoscope Size: Mac and 3 Grade View: Grade I Tube type: Oral Tube size: 7.0 mm Number of attempts: 1 Airway Equipment and Method: stylet and LTA kit utilized Placement Confirmation: ETT inserted through vocal cords under direct vision,  positive ETCO2 and breath sounds checked- equal and bilateral Secured at: 21 cm Tube secured with: Tape Dental Injury: Teeth and Oropharynx as per pre-operative assessment

## 2012-04-14 NOTE — Transfer of Care (Signed)
Immediate Anesthesia Transfer of Care Note  Patient: Katie Cox  Procedure(s) Performed: Procedure(s) (LRB) with comments: LAPAROSCOPIC CHOLECYSTECTOMY (N/A)  Patient Location: PACU  Anesthesia Type: General  Level of Consciousness: awake, alert , oriented and patient cooperative  Airway & Oxygen Therapy: Patient Spontanous Breathing and Patient connected to face mask oxygen  Post-op Assessment: Report given to PACU RN and Post -op Vital signs reviewed and stable  Post vital signs: Reviewed and stable  Complications: No apparent anesthesia complications

## 2012-04-14 NOTE — H&P (View-Only) (Signed)
Patient ID: Katie Cox, female   DOB: Jul 07, 1955, 56 y.o.   MRN: [id number]  Chief Complaint  Patient presents with  . Abdominal Pain    HPI Katie Cox is a 56 y.o. female.  Referred by Dr. Oneta Rack HPI This is a 56 year old female who is otherwise healthy. 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 Mostly this is constipation some occasional abdominal pain. She reports recently she began to develop constant epigastric, right upper quadrant pain associated with nausea this occurs all the time it occasionally radiates to her back and up into the right side of her chest. She has no emesis and describes nausea. She had no diarrhea associated with this. He makes her more nauseated and does worsen the pain. She also complains of some bloating and some tenderness in bilateral lower quadrants of her abdomen as well. There is been no changes in her bowel movements. She reports that she has a prior history of a colonoscopy that was without any abnormalities. She has undergone an ultrasound after developing the symptoms which shows the gallbladder having multiple gallstones. The remainder of this is normal. Her liver function tests I have today are also within normal limits. She comes in to discuss treatment for what sounds to be symptomatic cholelithiasis. Past Medical History  Diagnosis Date  . Hypertension   . Gallstones   . GERD (gastroesophageal reflux disease)   . Hyperlipidemia     Past Surgical History  Procedure Date  . Goiter removed fro thyroid   . Foot surgery     Family History  Problem Relation Age of Onset  . Breast cancer Maternal Aunt   . Colon cancer Father   . Heart disease Mother     Social History History  Substance Use Topics  . Smoking status: Never Smoker   . Smokeless tobacco: Not on file  . Alcohol Use: No    Allergies  Allergen Reactions  . Alprazolam   . Prednisone     Only high doses    Current Outpatient Prescriptions    Medication Sig Dispense Refill  . aspirin 81 MG tablet Take 81 mg by mouth daily.      . bumetanide (BUMEX) 2 MG tablet Take 2 mg by mouth daily.      . Cholecalciferol (VITAMIN D PO) Take by mouth.      . Cyanocobalamin (B-12 PO) Take by mouth.      . magnesium 30 MG tablet Take 30 mg by mouth 2 (two) times daily.      . Multiple Vitamin (MULTIVITAMIN) tablet Take 1 tablet by mouth daily.      . potassium chloride (KLOR-CON) 20 MEQ packet Take 20 mEq by mouth 2 (two) times daily.      . ranitidine (ZANTAC) 300 MG tablet Take 300 mg by mouth at bedtime.        Review of Systems Review of Systems  Constitutional: Negative for fever, chills and unexpected weight change.  HENT: Negative for hearing loss, congestion, sore throat, trouble swallowing and voice change.   Eyes: Negative for visual disturbance.  Respiratory: Negative for cough and wheezing.   Cardiovascular: Negative for chest pain, palpitations and leg swelling.  Gastrointestinal: Positive for nausea, abdominal pain and constipation. Negative for vomiting, diarrhea, blood in stool, abdominal distention and anal bleeding.  Genitourinary: Negative for hematuria, vaginal bleeding and difficulty urinating.  Musculoskeletal: Negative for arthralgias.  Skin: Negative for rash and wound.  Neurological: Negative for seizures,  syncope and headaches.  Hematological: Negative for adenopathy. Does not bruise/bleed easily.  Psychiatric/Behavioral: Negative for confusion.    Blood pressure 134/86, pulse 68, temperature 97.3 F (36.3 C), resp. rate 14, height 5\' 8"  (1.727 m), weight 155 lb 3.2 oz (70.398 kg).  Physical Exam Physical Exam  Vitals reviewed. Constitutional: She appears well-developed and well-nourished.  Eyes: No scleral icterus.  Cardiovascular: Normal rate, regular rhythm and normal heart sounds.   Pulmonary/Chest: Effort normal and breath sounds normal. She has no wheezes. She has no rales.  Abdominal: Soft. Normal  appearance. There is tenderness in the right upper quadrant, right lower quadrant and left lower quadrant. No hernia.  Lymphadenopathy:    She has no cervical adenopathy.    Data Reviewed History: Post prandial nausea and pain  Findings: Gallbladder is visualized in multiple projections.  Within the gallbladder, there are multiple echogenic foci which  move and shadow consistent with gallstones. There is no  gallbladder wall thickening or pericholecystic fluid collection.  There is no intrahepatic, common hepatic, or common bile duct  dilatation. Pancreas is largely obscured by gas. The small  portion of the pancreas which are visualized appear grossly normal.  No focal liver lesions are identified.  Spleen is normal in size and homogeneous echotexture. Kidneys  appear somewhat echogenic bilaterally but otherwise appear normal.  There is no ascites. There is atherosclerotic change in the aorta  but no aneurysm. Inferior vena cava appears of normal.  Conclusion: Cholelithiasis.  Pancreas largely obscured by gas.  Atherosclerotic change in the aorta.  Kidneys appear somewhat echogenic bilaterally. Question underlying  medical renal disease. Kidneys otherwise appear normal.  Study otherwise unremarkable.   Assessment    Symptomatic cholelithiasis    Plan    I discussed the procedure in detail.    We discussed the risks and benefits of a laparoscopic cholecystectomy and possible cholangiogram including, but not limited to bleeding, infection, injury to surrounding structures such as the intestine or liver, bile leak, retained gallstones, need to convert to an open procedure, prolonged diarrhea, blood clots such as  DVT, common bile duct injury, anesthesia risks, and possible need for additional procedures.  The likelihood of improvement in symptoms and return to the patient's normal status is good. We discussed the typical post-operative recovery course.         Frederick Marro 04/06/2012, 3:19 PM

## 2012-04-14 NOTE — Anesthesia Postprocedure Evaluation (Signed)
  Anesthesia Post-op Note  Patient: Katie Cox  Procedure(s) Performed: Procedure(s) (LRB) with comments: LAPAROSCOPIC CHOLECYSTECTOMY (N/A)  Patient Location: PACU  Anesthesia Type: General  Level of Consciousness: awake and alert   Airway and Oxygen Therapy: Patient Spontanous Breathing  Post-op Pain: mild  Post-op Assessment: Post-op Vital signs reviewed, Patient's Cardiovascular Status Stable, Respiratory Function Stable and Patent Airway  Post-op Vital Signs: Reviewed and stable  Complications: No apparent anesthesia complications

## 2012-04-14 NOTE — Interval H&P Note (Signed)
History and Physical Interval Note:  04/14/2012 8:32 AM  Katie Cox  has presented today for surgery, with the diagnosis of gallstones  The various methods of treatment have been discussed with the patient and family. After consideration of risks, benefits and other options for treatment, the patient has consented to  Procedure(s) (LRB) with comments: LAPAROSCOPIC CHOLECYSTECTOMY (N/A) as a surgical intervention .  The patient's history has been reviewed, patient examined, no change in status, stable for surgery.  I have reviewed the patient's chart and labs.  Questions were answered to the patient's satisfaction.     Mylo Choi

## 2012-04-15 ENCOUNTER — Telehealth (INDEPENDENT_AMBULATORY_CARE_PROVIDER_SITE_OTHER): Payer: Self-pay | Admitting: General Surgery

## 2012-04-15 ENCOUNTER — Ambulatory Visit (INDEPENDENT_AMBULATORY_CARE_PROVIDER_SITE_OTHER): Payer: 59 | Admitting: General Surgery

## 2012-04-15 ENCOUNTER — Encounter (HOSPITAL_BASED_OUTPATIENT_CLINIC_OR_DEPARTMENT_OTHER): Payer: Self-pay | Admitting: General Surgery

## 2012-04-15 NOTE — Telephone Encounter (Signed)
Returned pt's call and she wanted to know what her restrictions were.  I explained that we do not want her pushing, pulling, or lifting anything greater than 15 lbs for about 2 weeks.

## 2012-04-15 NOTE — Telephone Encounter (Signed)
Message copied by Littie Deeds on Thu Apr 15, 2012 10:49 AM ------      Message from: Cathi Roan      Created: Thu Apr 15, 2012  9:43 AM      Regarding: after surgery question      Contact: [phone]       Would like to speak to you about surgery. Dr. Dwain Sarna spoke with husband but she wants to speak with someone.

## 2012-04-16 ENCOUNTER — Telehealth (INDEPENDENT_AMBULATORY_CARE_PROVIDER_SITE_OTHER): Payer: Self-pay | Admitting: General Surgery

## 2012-04-16 NOTE — Telephone Encounter (Signed)
Spoke with patient. She had questions about how long to keep steri-strips in place and when she should shower. Patient advised to keep steri-strips in place 7-10 days and she can shower now, but she isn't to submerge wounds in a bath or scrub the incisions. She will call back with any additional questions.

## 2012-04-16 NOTE — Telephone Encounter (Signed)
Message copied by Liliana Cline on Fri Apr 16, 2012 11:04 AM ------      Message from: Marnette Burgess      Created: Fri Apr 16, 2012  9:44 AM      Contact: [phone]       Patient of Dr. Mauri Reading, forgot to ask you a question yesterday on the types of bandages she is using, please call.

## 2012-04-19 ENCOUNTER — Telehealth (INDEPENDENT_AMBULATORY_CARE_PROVIDER_SITE_OTHER): Payer: Self-pay | Admitting: General Surgery

## 2012-04-19 NOTE — Telephone Encounter (Signed)
Returned pt's call and she wanted to know how long she would feel bloated.  I explained that the bloating sensation is normal but I did want to know if she was having bowel movements.  She said it had been a couple of days since her last bowel movement.  I suggested she use MiraLax as directed to help her get to the point of having one soft bowel movement a day.  She also wanted to know why her umbilicus was so tender.  I explained that this is the incision where they remove the gallbladder from and that it is normal for it to be tender.  She wanted to know if we would write her a letter to return to work on 11/4.  I asked her to call me Friday 11/1 so I could make sure she was feeling better and that I would run it by Dr. Dwain Sarna.

## 2012-04-19 NOTE — Telephone Encounter (Signed)
Message copied by Littie Deeds on Mon Apr 19, 2012 10:21 AM ------      Message from: Zacarias Pontes      Created: Mon Apr 19, 2012  9:11 AM       Pt has questions from surgery...she didn't want to talk to triage but asked that you call when you can at [phone]

## 2012-04-23 ENCOUNTER — Encounter (INDEPENDENT_AMBULATORY_CARE_PROVIDER_SITE_OTHER): Payer: Self-pay | Admitting: General Surgery

## 2012-04-26 ENCOUNTER — Ambulatory Visit (INDEPENDENT_AMBULATORY_CARE_PROVIDER_SITE_OTHER): Payer: 59 | Admitting: General Surgery

## 2012-04-26 ENCOUNTER — Encounter (INDEPENDENT_AMBULATORY_CARE_PROVIDER_SITE_OTHER): Payer: Self-pay | Admitting: General Surgery

## 2012-04-26 VITALS — BP 128/64 | HR 60 | Temp 97.3°F | Resp 12 | Ht 68.0 in | Wt 154.8 lb

## 2012-04-26 DIAGNOSIS — Z09 Encounter for follow-up examination after completed treatment for conditions other than malignant neoplasm: Secondary | ICD-10-CM

## 2012-04-26 NOTE — Progress Notes (Signed)
Subjective:     Patient ID: Katie Cox, female   DOB: [birth year], 56 y.o.   MRN: [id number]  HPI This is a 56 year old female who I recently performed a laparoscopic cholecystectomy on. Her pathology returned as chronic cholecystitis, cholelithiasis, cholesterolosis. She has some nausea postoperatively but is otherwise doing well. She is having bowel movements.  Review of Systems     Objective:   Physical Exam Healing incisions without infection    Assessment:     Status post laparoscopic cholecystectomy    Plan:     I think she has a fairly normal postoperative course. I recommended to her that taking some over-the-counter Prilosec. If that does not help her nausea I asked her to call back this  week. Otherwise I think she would just keep getting better and I will plan on seeing her back in a month when she continues to have some issues.

## 2012-05-28 ENCOUNTER — Encounter (INDEPENDENT_AMBULATORY_CARE_PROVIDER_SITE_OTHER): Payer: 59 | Admitting: General Surgery

## 2012-06-28 ENCOUNTER — Encounter (INDEPENDENT_AMBULATORY_CARE_PROVIDER_SITE_OTHER): Payer: 59 | Admitting: General Surgery

## 2012-07-12 ENCOUNTER — Telehealth (INDEPENDENT_AMBULATORY_CARE_PROVIDER_SITE_OTHER): Payer: Self-pay | Admitting: General Surgery

## 2012-07-12 NOTE — Telephone Encounter (Signed)
I am not going to charge her no matter when it is for this.

## 2012-07-12 NOTE — Telephone Encounter (Signed)
Pt called in and informed us she had an appt w/ Dr. Dwain Sarna last week but that he was running an hour behind so she had to reschedule.  She got rescheduled to 07/29/12 but told that Elease Hashimoto, Dr. Doreen Salvage nurse, would call her to move it up.  She never received a call and wanted to see if it could be moved up so that it could be within her "postop" time frame where she isn't charged.  I informed her the soonest I could get her in would be 07/21/12 but that I would send this message to Dr. Dwain Sarna to see if we could work it out where we charge her as a post op so that she wouldn't have to pay a copay amount.

## 2012-07-13 ENCOUNTER — Telehealth (INDEPENDENT_AMBULATORY_CARE_PROVIDER_SITE_OTHER): Payer: Self-pay

## 2012-07-13 NOTE — Telephone Encounter (Signed)
LMOM for pt letting her know that we did receive her message about the r/s appt from the other day. The pt is scheduled for an appt on 1/29 with Dr Dwain Sarna and he replied that he would not charge anything except her po charge that will be applied. I advised pt if any questions to please call our office.

## 2012-07-21 ENCOUNTER — Encounter (INDEPENDENT_AMBULATORY_CARE_PROVIDER_SITE_OTHER): Payer: 59 | Admitting: General Surgery

## 2012-07-29 ENCOUNTER — Encounter (INDEPENDENT_AMBULATORY_CARE_PROVIDER_SITE_OTHER): Payer: 59 | Admitting: General Surgery

## 2012-08-06 ENCOUNTER — Encounter (INDEPENDENT_AMBULATORY_CARE_PROVIDER_SITE_OTHER): Payer: 59 | Admitting: General Surgery

## 2012-08-06 ENCOUNTER — Telehealth (INDEPENDENT_AMBULATORY_CARE_PROVIDER_SITE_OTHER): Payer: Self-pay

## 2012-08-06 NOTE — Telephone Encounter (Signed)
LMOM explaining I was trying to r/s Dr Doreen Salvage office from this am to Monday 2/17 pt needs to arrive at 1:30 for 1:45.

## 2012-08-09 ENCOUNTER — Encounter (INDEPENDENT_AMBULATORY_CARE_PROVIDER_SITE_OTHER): Payer: 59 | Admitting: General Surgery

## 2012-08-30 ENCOUNTER — Encounter (INDEPENDENT_AMBULATORY_CARE_PROVIDER_SITE_OTHER): Payer: Self-pay | Admitting: General Surgery

## 2012-08-30 ENCOUNTER — Ambulatory Visit (INDEPENDENT_AMBULATORY_CARE_PROVIDER_SITE_OTHER): Payer: 59 | Admitting: General Surgery

## 2012-08-30 VITALS — BP 110/68 | HR 60 | Temp 97.4°F | Resp 18 | Ht 68.0 in | Wt 156.6 lb

## 2012-08-30 DIAGNOSIS — Z09 Encounter for follow-up examination after completed treatment for conditions other than malignant neoplasm: Secondary | ICD-10-CM

## 2012-08-30 NOTE — Progress Notes (Signed)
Subjective:     Patient ID: Katie Cox, female   DOB: [birth year], 57 y.o.   MRN: [id number]  HPI This is a 57 year old female who I performed a laparoscopic cholecystectomy on. I saw her immediately postoperatively she was having some issues from some nausea. These have now resolved. She is eating well and is back to her baseline bowel movements. She reports no real complaints today. She is back to all of her normal activities as well.  Review of Systems     Objective:   Physical Exam    Abdomen is soft and nontender. Her incisions are all healing well without any evidence of infection Assessment:     Status post laparoscopic cholecystectomy     Plan:     She is doing well now. I will see her back as needed. She has no restrictions.

## 2012-11-29 ENCOUNTER — Other Ambulatory Visit: Payer: Self-pay

## 2012-11-29 DIAGNOSIS — Z1231 Encounter for screening mammogram for malignant neoplasm of breast: Secondary | ICD-10-CM

## 2013-01-17 ENCOUNTER — Ambulatory Visit
Admission: RE | Admit: 2013-01-17 | Discharge: 2013-01-17 | Disposition: A | Discharge status: Home / Self Care | Payer: 59 | Source: Ambulatory Visit

## 2013-01-17 DIAGNOSIS — Z1231 Encounter for screening mammogram for malignant neoplasm of breast: Secondary | ICD-10-CM

## 2013-08-11 ENCOUNTER — Encounter: Payer: Self-pay | Admitting: Gastroenterology

## 2013-09-04 ENCOUNTER — Other Ambulatory Visit: Payer: Self-pay | Admitting: Physician Assistant

## 2013-12-17 DIAGNOSIS — K219 Gastro-esophageal reflux disease without esophagitis: Secondary | ICD-10-CM | POA: Insufficient documentation

## 2013-12-17 DIAGNOSIS — G629 Polyneuropathy, unspecified: Secondary | ICD-10-CM | POA: Insufficient documentation

## 2013-12-17 DIAGNOSIS — E782 Mixed hyperlipidemia: Secondary | ICD-10-CM | POA: Insufficient documentation

## 2013-12-17 DIAGNOSIS — I10 Essential (primary) hypertension: Secondary | ICD-10-CM | POA: Insufficient documentation

## 2013-12-17 DIAGNOSIS — E559 Vitamin D deficiency, unspecified: Secondary | ICD-10-CM | POA: Insufficient documentation

## 2013-12-22 ENCOUNTER — Encounter: Payer: Self-pay | Admitting: Internal Medicine

## 2013-12-26 ENCOUNTER — Encounter: Payer: Self-pay | Admitting: Internal Medicine

## 2013-12-26 DIAGNOSIS — Z0001 Encounter for general adult medical examination with abnormal findings: Secondary | ICD-10-CM | POA: Insufficient documentation

## 2013-12-26 DIAGNOSIS — Z79899 Other long term (current) drug therapy: Secondary | ICD-10-CM

## 2013-12-26 HISTORY — DX: Other long term (current) drug therapy: Z79.899

## 2013-12-26 NOTE — Progress Notes (Signed)
Patient ID: Katie Cox, female   DOB: April 20, 1956, 58 y.o.   MRN: [id number]   C A N [facility] E [facility] L E D

## 2013-12-26 NOTE — Patient Instructions (Signed)

## 2014-01-13 ENCOUNTER — Other Ambulatory Visit: Payer: Self-pay | Admitting: Internal Medicine

## 2014-01-25 ENCOUNTER — Encounter: Payer: Self-pay | Admitting: Internal Medicine

## 2014-01-25 ENCOUNTER — Ambulatory Visit (INDEPENDENT_AMBULATORY_CARE_PROVIDER_SITE_OTHER): Payer: 59 | Admitting: Internal Medicine

## 2014-01-25 VITALS — BP 102/70 | HR 64 | Temp 97.9°F | Resp 16 | Ht 67.0 in | Wt 150.2 lb

## 2014-01-25 DIAGNOSIS — R7401 Elevation of levels of liver transaminase levels: Secondary | ICD-10-CM

## 2014-01-25 DIAGNOSIS — R740 Nonspecific elevation of levels of transaminase and lactic acid dehydrogenase [LDH]: Secondary | ICD-10-CM

## 2014-01-25 DIAGNOSIS — Z113 Encounter for screening for infections with a predominantly sexual mode of transmission: Secondary | ICD-10-CM

## 2014-01-25 DIAGNOSIS — R7402 Elevation of levels of lactic acid dehydrogenase (LDH): Secondary | ICD-10-CM

## 2014-01-25 DIAGNOSIS — Z111 Encounter for screening for respiratory tuberculosis: Secondary | ICD-10-CM

## 2014-01-25 DIAGNOSIS — I10 Essential (primary) hypertension: Secondary | ICD-10-CM

## 2014-01-25 DIAGNOSIS — Z0000 Encounter for general adult medical examination without abnormal findings: Secondary | ICD-10-CM

## 2014-01-25 DIAGNOSIS — E559 Vitamin D deficiency, unspecified: Secondary | ICD-10-CM

## 2014-01-25 DIAGNOSIS — Z1212 Encounter for screening for malignant neoplasm of rectum: Secondary | ICD-10-CM

## 2014-01-25 LAB — CBC WITH DIFFERENTIAL/PLATELET
Basophils Absolute: 0.1 10*3/uL (ref 0.0–0.1)
Basophils Relative: 1 % (ref 0–1)
EOS ABS: 0.1 10*3/uL (ref 0.0–0.7)
Eosinophils Relative: 1 % (ref 0–5)
HEMATOCRIT: 39.7 % (ref 36.0–46.0)
HEMOGLOBIN: 14.4 g/dL (ref 12.0–15.0)
LYMPHS ABS: 4.6 10*3/uL — AB (ref 0.7–4.0)
Lymphocytes Relative: 43 % (ref 12–46)
MCH: 32.6 pg (ref 26.0–34.0)
MCHC: 36.3 g/dL — ABNORMAL HIGH (ref 30.0–36.0)
MCV: 89.8 fL (ref 78.0–100.0)
MONO ABS: 0.6 10*3/uL (ref 0.1–1.0)
MONOS PCT: 6 % (ref 3–12)
NEUTROS PCT: 49 % (ref 43–77)
Neutro Abs: 5.2 10*3/uL (ref 1.7–7.7)
Platelets: 446 10*3/uL — ABNORMAL HIGH (ref 150–400)
RBC: 4.42 MIL/uL (ref 3.87–5.11)
RDW: 12.9 % (ref 11.5–15.5)
WBC: 10.6 10*3/uL — ABNORMAL HIGH (ref 4.0–10.5)

## 2014-01-25 LAB — HEMOGLOBIN A1C
HEMOGLOBIN A1C: 5 % (ref <5.7)
MEAN PLASMA GLUCOSE: 97 mg/dL (ref <117)

## 2014-01-25 MED ORDER — AMITRIPTYLINE HCL 10 MG PO TABS: ORAL_TABLET | ORAL | Status: DC

## 2014-01-25 NOTE — Progress Notes (Signed)
Patient ID: Katie Cox, female   DOB: March 08, 1956, 58 y.o.   MRN: [id number]   Annual Screening Comprehensive Examination  This very nice 58 y.o.MWF presents for complete physical.  Patient has been followed for HTN, Hyperlipidemia, and Vitamin D Deficiency. Patient's IBS is controlled on current meds.   Patient also has an idiopathic peripheral neuropathy with burning/stinging of the soles of her feetand was seen by Dr Jannifer Franklin, but she deferred the Lyrica that he recommended to her.     HTN predates since 2000. Patient's BP has been controlled at home and patient denies any cardiac symptoms as chest pain, palpitations, shortness of breath, dizziness, but does have hx/o dependent  ankle swelling. Today's BP: 102/70 mmHg.    Patient's hyperlipidemia is near controlled with diet. Last lipids were Chol 189, Tg 148, HDL 49 and LDL 110 in July 2014.   Patient is screened for prediabetes  and patient denies reactive hypoglycemic symptoms, visual blurring, diabetic polys, or paresthesias. Last A1c was 5.2% in July 2014.   Finally, patient has history of Vitamin D Deficiency of 28 in 2008 and last Vitamin D was 109 in July 2014.  Medication Sig  . aspirin 81 MG tablet Take 81 mg by mouth daily.  . bumetanide (BUMEX) 2 MG tablet Take 2 mg by mouth 2 (two) times daily.   . Cholecalciferol (VITAMIN D PO) Take 2,000 Units by mouth 3 (three) times daily.   . Cyanocobalamin (B-12 PO) Take by mouth.  . dicyclomine (BENTYL) 20 MG tablet TAKE 1 TABLET BY MOUTH TWICE A DAY  . magnesium 30 MG tablet Take 30 mg by mouth 2 (two) times daily.  . Multiple Vitamin (MULTIVITAMIN) tablet Take 1 tablet by mouth daily.  . potassium chloride (KLOR-CON) 20 MEQ packet Take 20 mEq by mouth 2 (two) times daily.  . ranitidine (ZANTAC) 300 MG tablet Take 300 mg by mouth daily.   Marland Kitchen ULORIC 40 MG tablet TAKE 1 TABLET DAILY   Allergies  Allergen Reactions  . Alprazolam   . Prednisone     Only high doses  . Allopurinol  Rash   Past Medical History  Diagnosis Date  . Contact lens/glasses fitting     wears contacts or glasses  . GERD (gastroesophageal reflux disease)   . Hyperlipidemia   . Hypertension   . Vitamin D deficiency   . Peripheral neuropathy    Past Surgical History  Procedure Laterality Date  . Goiter removed fro thyroid    . Foot surgery      both feet  . Colonoscopy    . Cholecystectomy  04/14/2012    Procedure: LAPAROSCOPIC CHOLECYSTECTOMY;  Surgeon: Rolm Bookbinder, MD;  Location: [facility];  Service: General;  Laterality: N/A;   Family History  Problem Relation Age of Onset  . Breast cancer Maternal Aunt   . Colon cancer Father   . Heart disease Mother    History  Substance Use Topics  . Smoking status: Never Smoker   . Smokeless tobacco: Not on file  . Alcohol Use: No    ROS Constitutional: Denies fever, chills, weight loss/gain, headaches, insomnia, fatigue, night sweats, and change in appetite. Eyes: Denies redness, blurred vision, diplopia, discharge, itchy, watery eyes.  ENT: Denies discharge, congestion, post nasal drip, epistaxis, sore throat, earache, hearing loss, dental pain, Tinnitus, Vertigo, Sinus pain, snoring.  Cardio: Denies chest pain, palpitations, irregular heartbeat, syncope, dyspnea, diaphoresis, orthopnea, PND, claudication, edema Respiratory: denies cough, dyspnea, DOE, pleurisy, hoarseness, laryngitis, wheezing.  Gastrointestinal: Denies dysphagia, heartburn, reflux, water brash, pain, cramps, nausea, vomiting, bloating, diarrhea, constipation, hematemesis, melena, hematochezia, jaundice, hemorrhoids Genitourinary: Denies dysuria, frequency, urgency, nocturia, hesitancy, discharge, hematuria, flank pain Breast: Breast lumps, nipple discharge, bleeding.  Musculoskeletal: Denies arthralgia, myalgia, stiffness, Jt. Swelling, pain, limp, and strain/sprain. Denies falls. Skin: Denies puritis, rash, hives, warts, acne, eczema, changing in  skin lesion Neuro: No weakness, tremor, incoordination, spasms, paresthesia, pain Psychiatric: Denies confusion, memory loss, sensory loss. Denies Depression. Endocrine: Denies change in weight, skin, hair change, nocturia, and paresthesia, diabetic polys, visual blurring, hyper / hypo glycemic episodes.  Heme/Lymph: No excessive bleeding, bruising, enlarged lymph nodes.  Physical Exam  BP 102/70  Pulse 64  Temp(Src) 97.9 F (36.6 C) (Temporal)  Resp 16  Ht 5\' 7"  (1.702 m)  Wt 150 lb 3.2 oz (68.13 kg)  BMI 23.52 kg/m2  General Appearance: Well nourished and in no apparent distress. Eyes: PERRLA, EOMs, conjunctiva no swelling or erythema, normal fundi and vessels. Sinuses: No frontal/maxillary tenderness ENT/Mouth: EACs patent / TMs  nl. Nares clear without erythema, swelling, mucoid exudates. Oral hygiene is good. No erythema, swelling, or exudate. Tongue normal, non-obstructing. Tonsils not swollen or erythematous. Hearing normal.  Neck: Supple, thyroid normal. No bruits, nodes or JVD. Respiratory: Respiratory effort normal.  BS equal and clear bilateral without rales, rhonci, wheezing or stridor. Cardio: Heart sounds are normal with regular rate and rhythm and no murmurs, rubs or gallops. Peripheral pulses are normal and equal bilaterally without edema. No aortic or femoral bruits. Chest: symmetric with normal excursions and percussion. Breasts: Symmetric, without lumps, nipple discharge, retractions, or fibrocystic changes.  Abdomen: Flat, soft, with bowl sounds. Nontender, no guarding, rebound, hernias, masses, or organomegaly.  Lymphatics: Non tender without lymphadenopathy.  Genitourinary:  Musculoskeletal: Full ROM all peripheral extremities, joint stability, 5/5 strength, and normal gait. Skin: Warm and dry without rashes, lesions, cyanosis, clubbing or  ecchymosis.  Neuro: Cranial nerves intact, reflexes equal bilaterally. Normal muscle tone, no cerebellar symptoms. Sensation  intact.  Pysch: Awake and oriented X 3, normal affect, Insight and Judgment appropriate.   Assessment and Plan  1. Annual Screening Examination 2. Hypertension  3. Hyperlipidemia 4. Peripheral Neuropathy 5. Vitamin D Deficiency 6. IBS  Continue prudent diet as discussed, weight control, BP monitoring, regular exercise, and medications. Discussed med's effects and SE's. Screening labs and tests as requested with regular follow-up as recommended.

## 2014-01-25 NOTE — Patient Instructions (Addendum)
Recommend the book "The END of DIETING" by Dr Baker Janus   and the book "The END of DIABETES " by Dr Excell Seltzer  At [email or web address] - get book & Audio CD's      Being diabetic has a  300% increased risk for heart attack, stroke, cancer, and alzheimer- type vascular dementia. It is very important that you work harder with diet by avoiding all foods that are white except chicken & fish. Avoid white rice (brown & wild rice is OK), white potatoes (sweetpotatoes in moderation is OK), White bread or wheat bread or anything made out of white flour like bagels, donuts, rolls, buns, biscuits, cakes, pastries, cookies, pizza crust, and pasta (made from white flour & egg whites) - vegetarian pasta or spinach or wheat pasta is OK. Multigrain breads like Arnold's or Pepperidge Farm, or multigrain sandwich thins or flatbreads.  Diet, exercise and weight loss can reverse and cure diabetes in the early stages.  Diet, exercise and weight loss is very important in the control and prevention of complications of diabetes which affects every system in your body, ie. Brain - dementia/stroke, eyes - glaucoma/blindness, heart - heart attack/heart failure, kidneys - dialysis, stomach - gastric paralysis, intestines - malabsorption, nerves - severe painful neuritis, circulation - gangrene & loss of a leg(s), and finally cancer and Alzheimers.    I recommend avoid fried & greasy foods,  sweets/candy, white rice (brown or wild rice or Quinoa is OK), white potatoes (sweet potatoes are OK) - anything made from white flour - bagels, doughnuts, rolls, buns, biscuits,white and wheat breads, pizza crust and traditional pasta made of white flour & egg white(vegetarian pasta or spinach or wheat pasta is OK).  Multi-grain bread is OK - like multi-grain flat bread or sandwich thins. Avoid alcohol in excess. Exercise is also important.    Eat all the vegetables you want - avoid meat, especially red meat and dairy - especially cheese.  Cheese  is the most concentrated form of trans-fats which is the worst thing to clog up our arteries. Veggie cheese is OK which can be found in the fresh produce section at [employer] or [employer] or [employer]   Peripheral Neuropathy  Peripheral neuropathy is a type of nerve damage. It affects nerves that carry signals between the spinal cord and other parts of the body. These are called peripheral nerves. With peripheral neuropathy, one nerve or a group of nerves may be damaged.  CAUSES  Many things can damage peripheral nerves. For some people with peripheral neuropathy, the cause is unknown. Some causes include:  Diabetes. This is the most common cause of peripheral neuropathy.  Injury to a nerve.  Pressure or stress on a nerve that lasts a long time.  Too little vitamin B. Alcoholism can lead to this.  Infections.  Autoimmune diseases, such as multiple sclerosis and systemic lupus erythematosus.  Inherited nerve diseases.  Some medicines, such as cancer drugs.  Toxic substances, such as lead and mercury.  Too little blood flowing to the legs.  Kidney disease.  Thyroid disease. SIGNS AND SYMPTOMS  Different people have different symptoms. The symptoms you have will depend on which of your nerves is damaged. Common symptoms include:  Loss of feeling (numbness) in the feet and hands.  Tingling in the feet and hands.  Pain that burns.  Very sensitive skin.  Weakness.  Not being able to move a part of the body (paralysis).  Muscle twitching.  Clumsiness or poor coordination.  Loss of balance.  Not being able to control your bladder.  Feeling dizzy.  Sexual problems. DIAGNOSIS  Peripheral neuropathy is a symptom, not a disease. Finding the cause of peripheral neuropathy can be hard. To figure that out, your health care provider will take a medical history and do a physical exam. A neurological exam will also be done. This involves checking things affected by  your brain, spinal cord, and nerves (nervous system). For example, your health care provider will check your reflexes, how you move, and what you can feel.  Other types of tests may also be ordered, such as:  Blood tests.  A test of the fluid in your spinal cord.  Imaging tests, such as CT scans or an MRI.  Electromyography (EMG). This test checks the nerves that control muscles.  Nerve conduction velocity tests. These tests check how fast messages pass through your nerves.  Nerve biopsy. A small piece of nerve is removed. It is then checked under a microscope. TREATMENT   Medicine is often used to treat peripheral neuropathy. Medicines may include:  Pain-relieving medicines. Prescription or over-the-counter medicine may be suggested.  Antiseizure medicine. This may be used for pain.  Antidepressants. These also may help ease pain from neuropathy.  Lidocaine. This is a numbing medicine. You might wear a patch or be given a shot.  Mexiletine. This medicine is typically used to help control irregular heart rhythms.  Surgery. Surgery may be needed to relieve pressure on a nerve or to destroy a nerve that is causing pain.  Physical therapy to help movement.  Assistive devices to help movement. HOME CARE INSTRUCTIONS   Only take over-the-counter or prescription medicines as directed by your health care provider. Follow the instructions carefully for any given medicines. Do not take any other medicines without first getting approval from your health care provider.  If you have diabetes, work closely with your health care provider to keep your blood sugar under control.  If you have numbness in your feet:  Check every day for signs of injury or infection. Watch for redness, warmth, and swelling.  Wear padded socks and comfortable shoes. These help protect your feet.  Do not do things that put pressure on your damaged nerve.  Do not smoke. Smoking keeps blood from getting to  damaged nerves.  Avoid or limit alcohol. Too much alcohol can cause a lack of B vitamins. These vitamins are needed for healthy nerves.  Develop a good support system. Coping with peripheral neuropathy can be stressful. Talk to a mental health specialist or join a support group if you are struggling.  Follow up with your health care provider as directed.

## 2014-01-26 LAB — VITAMIN B12: VITAMIN B 12: 881 pg/mL (ref 211–911)

## 2014-01-26 LAB — BASIC METABOLIC PANEL WITH GFR
BUN: 23 mg/dL (ref 6–23)
CALCIUM: 10 mg/dL (ref 8.4–10.5)
CO2: 29 meq/L (ref 19–32)
Chloride: 98 mEq/L (ref 96–112)
Creat: 0.9 mg/dL (ref 0.50–1.10)
GFR, Est African American: 81 mL/min
GFR, Est Non African American: 70 mL/min
GLUCOSE: 82 mg/dL (ref 70–99)
POTASSIUM: 4.4 meq/L (ref 3.5–5.3)
SODIUM: 140 meq/L (ref 135–145)

## 2014-01-26 LAB — HEPATITIS C ANTIBODY: HCV Ab: NEGATIVE

## 2014-01-26 LAB — URINALYSIS, MICROSCOPIC ONLY
Bacteria, UA: NONE SEEN
CRYSTALS: NONE SEEN
Casts: NONE SEEN
SQUAMOUS EPITHELIAL / LPF: NONE SEEN

## 2014-01-26 LAB — MICROALBUMIN / CREATININE URINE RATIO
CREATININE, URINE: 14.9 mg/dL
MICROALB/CREAT RATIO: 53.7 mg/g — AB (ref 0.0–30.0)
Microalb, Ur: 0.8 mg/dL (ref 0.00–1.89)

## 2014-01-26 LAB — LIPID PANEL
CHOLESTEROL: 191 mg/dL (ref 0–200)
HDL: 62 mg/dL (ref >39)
LDL CALC: 91 mg/dL (ref 0–99)
Total CHOL/HDL Ratio: 3.1 Ratio
Triglycerides: 189 mg/dL — ABNORMAL HIGH (ref <150)
VLDL: 38 mg/dL (ref 0–40)

## 2014-01-26 LAB — TSH: TSH: 1.567 u[IU]/mL (ref 0.350–4.500)

## 2014-01-26 LAB — HEPATITIS B CORE ANTIBODY, TOTAL: HEP B C TOTAL AB: NONREACTIVE

## 2014-01-26 LAB — MAGNESIUM: Magnesium: 2 mg/dL (ref 1.5–2.5)

## 2014-01-26 LAB — HEPATIC FUNCTION PANEL
ALT: 20 U/L (ref 0–35)
AST: 27 U/L (ref 0–37)
Albumin: 4.9 g/dL (ref 3.5–5.2)
Alkaline Phosphatase: 64 U/L (ref 39–117)
BILIRUBIN DIRECT: 0.2 mg/dL (ref 0.0–0.3)
BILIRUBIN INDIRECT: 1 mg/dL (ref 0.2–1.2)
Total Bilirubin: 1.2 mg/dL (ref 0.2–1.2)
Total Protein: 7.7 g/dL (ref 6.0–8.3)

## 2014-01-26 LAB — HIV ANTIBODY (ROUTINE TESTING W REFLEX): HIV: NONREACTIVE

## 2014-01-26 LAB — HEPATITIS B SURFACE ANTIBODY,QUALITATIVE: Hep B S Ab: NEGATIVE

## 2014-01-26 LAB — RPR

## 2014-01-26 LAB — INSULIN, FASTING: INSULIN FASTING, SERUM: 11 u[IU]/mL (ref 3–28)

## 2014-01-26 LAB — HEPATITIS A ANTIBODY, TOTAL: HEP A TOTAL AB: NONREACTIVE

## 2014-01-26 LAB — VITAMIN D 25 HYDROXY (VIT D DEFICIENCY, FRACTURES): Vit D, 25-Hydroxy: 116 ng/mL — ABNORMAL HIGH (ref 30–89)

## 2014-01-27 LAB — HEPATITIS B E ANTIBODY: HEPATITIS BE ANTIBODY: NONREACTIVE

## 2014-01-30 ENCOUNTER — Other Ambulatory Visit: Payer: Self-pay | Admitting: Internal Medicine

## 2014-01-30 LAB — TB SKIN TEST
Induration: 0 mm
TB Skin Test: NEGATIVE

## 2014-01-31 ENCOUNTER — Encounter: Payer: Self-pay | Admitting: Internal Medicine

## 2014-02-12 ENCOUNTER — Other Ambulatory Visit: Payer: Self-pay | Admitting: Internal Medicine

## 2014-02-14 ENCOUNTER — Other Ambulatory Visit: Payer: Self-pay

## 2014-02-14 MED ORDER — FEBUXOSTAT 40 MG PO TABS: 40.0000 mg | ORAL_TABLET | Freq: Every day | ORAL | Status: DC

## 2014-05-03 ENCOUNTER — Ambulatory Visit: Payer: Self-pay | Admitting: Physician Assistant

## 2014-05-22 ENCOUNTER — Encounter: Payer: Self-pay | Admitting: Physician Assistant

## 2014-05-22 ENCOUNTER — Ambulatory Visit (INDEPENDENT_AMBULATORY_CARE_PROVIDER_SITE_OTHER): Payer: 59 | Admitting: Physician Assistant

## 2014-05-22 VITALS — BP 112/72 | HR 56 | Temp 97.5°F | Resp 16 | Ht 67.0 in | Wt 150.0 lb

## 2014-05-22 DIAGNOSIS — G629 Polyneuropathy, unspecified: Secondary | ICD-10-CM

## 2014-05-22 DIAGNOSIS — Z79899 Other long term (current) drug therapy: Secondary | ICD-10-CM

## 2014-05-22 DIAGNOSIS — E559 Vitamin D deficiency, unspecified: Secondary | ICD-10-CM

## 2014-05-22 NOTE — Progress Notes (Signed)
Assessment and Plan:  Hypertension: Continue medication, monitor blood pressure at home. Continue DASH diet.  Reminder to go to the ER if any CP, SOB, nausea, dizziness, severe HA, changes vision/speech, left arm numbness and tingling, and jaw pain. Recheck CBC since abnormal last time Recheck BMP since on bumex P.neuropathy, idiopathic- increase amitriptyline to 3 a day, and declined Lumbar xray at this time.  Vitamin D Def- check level and continue medications.   Continue diet and meds as discussed. Further disposition pending results of labs.  HPI 58 y.o. female  presents for 3 month follow up with hypertension, hyperlipidemia, prediabetes and vitamin D. Her blood pressure has been controlled at home, today their BP is BP: 112/72 mmHg She does not workout. She denies chest pain, shortness of breath, dizziness.  She is not on cholesterol medication and denies myalgias. Her cholesterol is at goal. The cholesterol last visit was:   Lab Results  Component Value Date   CHOL 191 01/25/2014   HDL 62 01/25/2014   LDLCALC 91 01/25/2014   TRIG 189* 01/25/2014   CHOLHDL 3.1 01/25/2014    Last A1C in the office was:  Lab Results  Component Value Date   HGBA1C 5.0 01/25/2014   Patient is on Vitamin D supplement, she was decreased 2 pills a day, 4000 IU.   Lab Results  Component Value Date   VD25OH 116* 01/25/2014     She states that her numbness is worse than usually, she has numbness/tingling bilateral legs and higher up than usual.   Current Medications:       amitriptyline 10 MG tablet  Commonly known as:  ELAVIL  Take 1 to 3 tablets at bedtime for neuropathy     aspirin 81 MG tablet  Take 81 mg by mouth daily.     B-12 PO  Take by mouth.     bumetanide 2 MG tablet  Commonly known as:  BUMEX  TAKE 1 TABLET TWICE A DAY FOR BLOOD PRESSURE AND FLUID     dicyclomine 20 MG tablet  Commonly known as:  BENTYL  TAKE 1 TABLET BY MOUTH TWICE A DAY FOR ABDOMINAL CRAMPS & BLOATING      febuxostat 40 MG tablet  Commonly known as:  ULORIC  Take 1 tablet (40 mg total) by mouth daily.     magnesium 30 MG tablet  Take 30 mg by mouth 2 (two) times daily.     multivitamin tablet  Take 1 tablet by mouth daily.     potassium chloride 20 MEQ packet  Commonly known as:  KLOR-CON  Take 20 mEq by mouth 2 (two) times daily.     ranitidine 300 MG tablet  Commonly known as:  ZANTAC  TAKE 1 TABLET DAILY FOR ACID REFLUX     VITAMIN D PO  Take 2,000 Units by mouth 3 (three) times daily.        Medical History:  Past Medical History  Diagnosis Date  . Contact lens/glasses fitting     wears contacts or glasses  . GERD (gastroesophageal reflux disease)   . Hyperlipidemia   . Hypertension   . Vitamin D deficiency   . Peripheral neuropathy    Allergies:  Allergies  Allergen Reactions  . Alprazolam   . Prednisone     Only high doses  . Allopurinol Rash     Review of Systems: [X]  = complains of  [ ]  = denies  General: Fatigue [ ]  Fever [ ]  Chills [ ]  Weakness [ ]   Insomnia [ ]  Eyes: Redness [ ]  Blurred vision [ ]  Diplopia [ ]   ENT: Congestion [ ]  Sinus Pain [ ]  Post Nasal Drip [ ]  Sore Throat [ ]  Earache [ ]   Cardiac: Chest pain/pressure [ ]  SOB [ ]  Orthopnea [ ]   Palpitations [ ]   Paroxysmal nocturnal dyspnea[ ]  Claudication [ ]  Edema [ ]   Pulmonary: Cough [ ]  Wheezing[ ]   SOB [ ]   Snoring [ ]   GI: Nausea [ ]  Vomiting[ ]  Dysphagia[ ]  Heartburn[ ]  Abdominal pain [ ]  Constipation [ ] ; Diarrhea [ ] ; BRBPR [ ]  Melena[ ]  GU: Hematuria[ ]  Dysuria [ ]  Nocturia[ ]  Urgency [ ]   Hesitancy [ ]  Discharge [ ]  Neuro: Headaches[ ]  Vertigo[ ]  Paresthesias[x ] Spasm [ ]  Speech changes [ ]  Incoordination [ ]   Ortho: Arthritis [ ]  Joint pain [ ]  Muscle pain [ ]  Joint swelling [ ]  Back Pain [ x] Skin:  Rash [ ]   Pruritis [ ]  Change in skin lesion [ ]   Psych: Depression[ ]  Anxiety[ ]  Confusion [ ]  Memory loss [ ]   Heme/Lypmh: Bleeding [ ]  Bruising [ ]  Enlarged lymph nodes [ ]    Endocrine: Visual blurring [ ]  Paresthesia [ ]  Polyuria [ ]  Polydypsea [ ]    Heat/cold intolerance [ ]  Hypoglycemia [ ]   Family history- Review and unchanged Social history- Review and unchanged Physical Exam: BP 112/72 mmHg  Pulse 56  Temp(Src) 97.5 F (36.4 C)  Resp 16  Ht 5\' 7"  (1.702 m)  Wt 150 lb (68.04 kg)  BMI 23.49 kg/m2 Wt Readings from Last 3 Encounters:  05/22/14 150 lb (68.04 kg)  01/25/14 150 lb 3.2 oz (68.13 kg)  08/30/12 156 lb 9.6 oz (71.033 kg)   General Appearance: Well nourished, in no apparent distress. Eyes: PERRLA, EOMs, conjunctiva no swelling or erythema Sinuses: No Frontal/maxillary tenderness ENT/Mouth: Ext aud canals clear, TMs without erythema, bulging. No erythema, swelling, or exudate on post pharynx.  Tonsils not swollen or erythematous. Hearing normal.  Neck: Supple, thyroid normal.  Respiratory: Respiratory effort normal, BS equal bilaterally without rales, rhonchi, wheezing or stridor.  Cardio: RRR with no MRGs. Brisk peripheral pulses without edema.  Abdomen: Soft, + BS.  Non tender, no guarding, rebound, hernias, masses. Lymphatics: Non tender without lymphadenopathy.  Musculoskeletal: Full ROM, 5/5 strength, normal gait.  Skin: Warm, dry without rashes, lesions, ecchymosis.  Neuro: Cranial nerves intact. Normal muscle tone, no cerebellar symptoms. Sensation decreased bilateral legs to mid shin Psych: Awake and oriented X 3, normal affect, Insight and Judgment appropriate.    Vicie Mutters, PA-C 4:21 PM [facility]

## 2014-05-22 NOTE — Patient Instructions (Signed)

## 2014-05-23 LAB — HEPATIC FUNCTION PANEL
ALK PHOS: 73 U/L (ref 39–117)
ALT: 21 U/L (ref 0–35)
AST: 28 U/L (ref 0–37)
Albumin: 4.4 g/dL (ref 3.5–5.2)
BILIRUBIN TOTAL: 1 mg/dL (ref 0.2–1.2)
Bilirubin, Direct: 0.2 mg/dL (ref 0.0–0.3)
Indirect Bilirubin: 0.8 mg/dL (ref 0.2–1.2)
TOTAL PROTEIN: 7.2 g/dL (ref 6.0–8.3)

## 2014-05-23 LAB — BASIC METABOLIC PANEL WITH GFR
BUN: 23 mg/dL (ref 6–23)
CALCIUM: 10 mg/dL (ref 8.4–10.5)
CHLORIDE: 101 meq/L (ref 96–112)
CO2: 30 meq/L (ref 19–32)
Creat: 1.03 mg/dL (ref 0.50–1.10)
GFR, EST NON AFRICAN AMERICAN: 60 mL/min
GFR, Est African American: 69 mL/min
Glucose, Bld: 79 mg/dL (ref 70–99)
Potassium: 3.9 mEq/L (ref 3.5–5.3)
Sodium: 143 mEq/L (ref 135–145)

## 2014-05-23 LAB — CBC WITH DIFFERENTIAL/PLATELET
BASOS ABS: 0.1 10*3/uL (ref 0.0–0.1)
Basophils Relative: 1 % (ref 0–1)
EOS ABS: 0.1 10*3/uL (ref 0.0–0.7)
Eosinophils Relative: 1 % (ref 0–5)
HCT: 40.2 % (ref 36.0–46.0)
Hemoglobin: 14.1 g/dL (ref 12.0–15.0)
Lymphocytes Relative: 47 % — ABNORMAL HIGH (ref 12–46)
Lymphs Abs: 4.6 10*3/uL — ABNORMAL HIGH (ref 0.7–4.0)
MCH: 32.1 pg (ref 26.0–34.0)
MCHC: 35.1 g/dL (ref 30.0–36.0)
MCV: 91.6 fL (ref 78.0–100.0)
MONO ABS: 0.7 10*3/uL (ref 0.1–1.0)
MPV: 10.2 fL (ref 9.4–12.4)
Monocytes Relative: 7 % (ref 3–12)
Neutro Abs: 4.3 10*3/uL (ref 1.7–7.7)
Neutrophils Relative %: 44 % (ref 43–77)
PLATELETS: 389 10*3/uL (ref 150–400)
RBC: 4.39 MIL/uL (ref 3.87–5.11)
RDW: 12.4 % (ref 11.5–15.5)
WBC: 9.8 10*3/uL (ref 4.0–10.5)

## 2014-05-23 LAB — VITAMIN B12: Vitamin B-12: 789 pg/mL (ref 211–911)

## 2014-05-23 LAB — MAGNESIUM: Magnesium: 2.2 mg/dL (ref 1.5–2.5)

## 2014-05-23 LAB — VITAMIN D 25 HYDROXY (VIT D DEFICIENCY, FRACTURES): VIT D 25 HYDROXY: 81 ng/mL (ref 30–100)

## 2014-07-05 ENCOUNTER — Encounter: Payer: Self-pay | Admitting: Physician Assistant

## 2014-07-05 ENCOUNTER — Ambulatory Visit (INDEPENDENT_AMBULATORY_CARE_PROVIDER_SITE_OTHER): Payer: 59 | Admitting: Physician Assistant

## 2014-07-05 VITALS — BP 120/64 | HR 60 | Temp 98.0°F | Resp 16 | Ht 67.0 in | Wt 150.0 lb

## 2014-07-05 DIAGNOSIS — J0100 Acute maxillary sinusitis, unspecified: Secondary | ICD-10-CM

## 2014-07-05 MED ORDER — PROMETHAZINE-CODEINE 6.25-10 MG/5ML PO SYRP: 5.0000 mL | ORAL_SOLUTION | Freq: Four times a day (QID) | ORAL | Status: DC | PRN

## 2014-07-05 MED ORDER — AZITHROMYCIN 250 MG PO TABS
ORAL_TABLET | ORAL | Status: AC
Start: 2014-07-05 — End: 2014-07-10

## 2014-07-05 NOTE — Progress Notes (Signed)
HPI  A Caucasian 59 y.o.female presents to the office today due to cough and nasal congestion that started 3 days ago.  Patient states symptoms are getting worse.  Patient's sxs are worse at night when lying down due to drainage.  She has tried Tylenol and [employer], advertising OTC with no relief.  Patient reports congestion, sinus pressure, rhinorrhea, sneezing, sore throat in the beginning, cough with white mucus, nausea and headaches around eyes.  She denies fever, chills, sweats, fatigue, ear pain, ear congestion, trouble swallowing, SOB, wheezing, CP, abdominal pain, vomiting, diarrhea, constipation, dizziness and lightheadedness.   GFR= 60 on 05/22/14 Review of Systems  All other systems reviewed and are negative.  Past Medical History-  Past Medical History  Diagnosis Date  . Contact lens/glasses fitting     wears contacts or glasses  . GERD (gastroesophageal reflux disease)   . Hyperlipidemia   . Hypertension   . Vitamin D deficiency   . Peripheral neuropathy    Medications-  Current Outpatient Prescriptions on File Prior to Visit  Medication Sig Dispense Refill  . amitriptyline (ELAVIL) 10 MG tablet Take 1 to 3 tablets at bedtime for neuropathy 90 tablet 99  . aspirin 81 MG tablet Take 81 mg by mouth daily.    . bumetanide (BUMEX) 2 MG tablet TAKE 1 TABLET TWICE A DAY FOR BLOOD PRESSURE AND FLUID 180 tablet 2  . Cholecalciferol (VITAMIN D PO) Take 2,000 Units by mouth 3 (three) times daily.     Marland Kitchen dicyclomine (BENTYL) 20 MG tablet TAKE 1 TABLET BY MOUTH TWICE A DAY FOR ABDOMINAL CRAMPS & BLOATING 60 tablet 8  . febuxostat (ULORIC) 40 MG tablet Take 1 tablet (40 mg total) by mouth daily. 90 tablet 3  . magnesium 30 MG tablet Take 30 mg by mouth 2 (two) times daily.    . Multiple Vitamin (MULTIVITAMIN) tablet Take 1 tablet by mouth daily.    . potassium chloride (KLOR-CON) 20 MEQ packet Take 20 mEq by mouth 2 (two) times daily.    . ranitidine (ZANTAC) 300 MG tablet TAKE 1 TABLET DAILY  FOR ACID REFLUX 90 tablet 99   No current facility-administered medications on file prior to visit.   Allergies-  Allergies  Allergen Reactions  . Alprazolam   . Prednisone     Only high doses  . Allopurinol Rash   Physical Exam BP 120/64 mmHg  Pulse 60  Temp(Src) 98 F (36.7 C) (Temporal)  Resp 16  Ht 5\' 7"  (1.702 m)  Wt 150 lb (68.04 kg)  BMI 23.49 kg/m2  SpO2 97% Wt Readings from Last 3 Encounters:  07/05/14 150 lb (68.04 kg)  05/22/14 150 lb (68.04 kg)  01/25/14 150 lb 3.2 oz (68.13 kg)  Vitals Reviewed. General Appearance: Well nourished, in no apparent distress and had pleasant demeanor. Sickly appearance. Eyes:  PERRLA. EOMI. Conjunctiva is pink without edema, erythema or yellowing.  No scleral icterus. Sinuses: Mild maxillary tenderness upon palpation.  No Frontal tenderness. Ears: No erythema, edema or tenderness on both external ear cartilages and ear canals.  TMs are intact bilaterally with normal light reflexes and without erythema, edema, or bulging. Nose: Nose is symmetrical and turbinates are erythematous and edematous bilaterally.  No polyps or paleness.  Rhinorrhea present. Throat: Oral pharynx is pink and moist.  Erythematous and non-edematous posterior pharynx.   Mucosa is intact and without lesions. Tonsils are at +1 station bilaterally and do not have exudate.    Uvula is midline and not swollen.  Neck: Supple,  LAD, trachea is midline. Full range of motion in neck intact Respiratory: CTAB,  r/r/w or stridor. No increased effort of breathing. Cardio: RRR.   m/r/g.  S1S2nl.   Abdomen: Symmetrical, soft, nontender, and flat.  +BS nl x4.   Extremities:  C/C/E in upper and lower extremities.  Pulses B/L +2  Skin: Warm, dry, intact without rashes, lesions, ecchymosis, yellowing, cyanosis.  Neuro: Alert and oriented X3, cooperative.  Mood and affect appropriate to situation. CN II-XII grossly intact.   Normal gait. Psych: Insight and Judgment  appropriate.   Assessment and Plan 1. Acute maxillary sinusitis, recurrence not specified - azithromycin (ZITHROMAX) 250 MG tablet; Take 2 tablets PO on day 1, then 1 tablet PO Q24H x 4 days  Dispense: 6 tablet; Refill: 1 -For cough and cough pain- promethazine-codeine (PHENERGAN WITH CODEINE) 6.25-10 MG/5ML syrup; Take 5 mLs by mouth every 6 (six) hours as needed for cough. Max: 49mL per day  Dispense: 118 mL; Refill: 0 -Patient refused prednisone low dose taper.  Told patient that if no improvement from current treatment, then I can send in low dose taper to pharmacy.  Discussed medication effects and SE's.  Pt agreed to treatment plan. If you are not feeling better in 10-14 days, then please call the office. Please keep your follow up appt on 08/10/14.  Xiadani Damman, Stephani Police, PA-C 2:37 PM [facility]

## 2014-07-05 NOTE — Patient Instructions (Addendum)
-Take Z-Pak as prescribed -Take Promethazine-Codeine as prescribed for cough.  Please take at night as it can make you sleepy. Please make sure you are drinking water to stay hydrated.  If you are not feeling better in 10-14 days, then please call the office.   Sinusitis Sinusitis is redness, soreness, and inflammation of the paranasal sinuses. Paranasal sinuses are air pockets within the bones of your face (beneath the eyes, the middle of the forehead, or above the eyes). In healthy paranasal sinuses, mucus is able to drain out, and air is able to circulate through them by way of your nose. However, when your paranasal sinuses are inflamed, mucus and air can become trapped. This can allow bacteria and other germs to grow and cause infection. Sinusitis can develop quickly and last only a short time (acute) or continue over a long period (chronic). Sinusitis that lasts for more than 12 weeks is considered chronic.  CAUSES  Causes of sinusitis include:  Allergies.  Structural abnormalities, such as displacement of the cartilage that separates your nostrils (deviated septum), which can decrease the air flow through your nose and sinuses and affect sinus drainage.  Functional abnormalities, such as when the small hairs (cilia) that line your sinuses and help remove mucus do not work properly or are not present. SIGNS AND SYMPTOMS  Symptoms of acute and chronic sinusitis are the same. The primary symptoms are pain and pressure around the affected sinuses. Other symptoms include:  Upper toothache.  Earache.  Headache.  Bad breath.  Decreased sense of smell and taste.  A cough, which worsens when you are lying flat.  Fatigue.  Fever.  Thick drainage from your nose, which often is green and may contain pus (purulent).  Swelling and warmth over the affected sinuses. DIAGNOSIS  Your health care provider will perform a physical exam. During the exam, your health care provider  may:  Look in your nose for signs of abnormal growths in your nostrils (nasal polyps).  Tap over the affected sinus to check for signs of infection.  View the inside of your sinuses (endoscopy) using an imaging device that has a light attached (endoscope). If your health care provider suspects that you have chronic sinusitis, one or more of the following tests may be recommended:  Allergy tests.  Nasal culture. A sample of mucus is taken from your nose, sent to a lab, and screened for bacteria.  Nasal cytology. A sample of mucus is taken from your nose and examined by your health care provider to determine if your sinusitis is related to an allergy. TREATMENT  Most cases of acute sinusitis are related to a viral infection and will resolve on their own within 10 days. Sometimes medicines are prescribed to help relieve symptoms (pain medicine, decongestants, nasal steroid sprays, or saline sprays).  However, for sinusitis related to a bacterial infection, your health care provider will prescribe antibiotic medicines. These are medicines that will help kill the bacteria causing the infection.  Rarely, sinusitis is caused by a fungal infection. In theses cases, your health care provider will prescribe antifungal medicine. For some cases of chronic sinusitis, surgery is needed. Generally, these are cases in which sinusitis recurs more than 3 times per year, despite other treatments. HOME CARE INSTRUCTIONS   Drink plenty of water. Water helps thin the mucus so your sinuses can drain more easily.  Use a humidifier.  Inhale steam 3 to 4 times a day (for example, sit in the bathroom with the shower  running).  Apply a warm, moist washcloth to your face 3 to 4 times a day, or as directed by your health care provider.  Use saline nasal sprays to help moisten and clean your sinuses.  Take medicines only as directed by your health care provider.  If you were prescribed either an antibiotic or  antifungal medicine, finish it all even if you start to feel better. SEEK IMMEDIATE MEDICAL CARE IF:  You have increasing pain or severe headaches.  You have nausea, vomiting, or drowsiness.  You have swelling around your face.  You have vision problems.  You have a stiff neck.  You have difficulty breathing. MAKE SURE YOU:   Understand these instructions.  Will watch your condition.  Will get help right away if you are not doing well or get worse. Document Released: 06/09/2005 Document Revised: 10/24/2013 Document Reviewed: 06/24/2011 [facility] Patient Information 2015 [place], [place]. This information is not intended to replace advice given to you by your health care provider. Make sure you discuss any questions you have with your health care provider.

## 2014-07-11 ENCOUNTER — Other Ambulatory Visit: Payer: Self-pay

## 2014-07-11 MED ORDER — POTASSIUM CHLORIDE CRYS ER 20 MEQ PO TBCR: 20.0000 meq | EXTENDED_RELEASE_TABLET | Freq: Two times a day (BID) | ORAL | Status: DC

## 2014-08-10 ENCOUNTER — Encounter: Payer: Self-pay | Admitting: Internal Medicine

## 2014-08-10 ENCOUNTER — Ambulatory Visit (INDEPENDENT_AMBULATORY_CARE_PROVIDER_SITE_OTHER): Payer: 59 | Admitting: Internal Medicine

## 2014-08-10 VITALS — BP 132/82 | HR 60 | Temp 97.7°F | Resp 18 | Ht 67.0 in | Wt 153.2 lb

## 2014-08-10 DIAGNOSIS — I10 Essential (primary) hypertension: Secondary | ICD-10-CM

## 2014-08-10 DIAGNOSIS — R7309 Other abnormal glucose: Secondary | ICD-10-CM

## 2014-08-10 DIAGNOSIS — K589 Irritable bowel syndrome without diarrhea: Secondary | ICD-10-CM

## 2014-08-10 DIAGNOSIS — E559 Vitamin D deficiency, unspecified: Secondary | ICD-10-CM

## 2014-08-10 DIAGNOSIS — E785 Hyperlipidemia, unspecified: Secondary | ICD-10-CM

## 2014-08-10 DIAGNOSIS — E790 Hyperuricemia without signs of inflammatory arthritis and tophaceous disease: Secondary | ICD-10-CM

## 2014-08-10 DIAGNOSIS — K219 Gastro-esophageal reflux disease without esophagitis: Secondary | ICD-10-CM

## 2014-08-10 DIAGNOSIS — M1000 Idiopathic gout, unspecified site: Secondary | ICD-10-CM

## 2014-08-10 DIAGNOSIS — Z79899 Other long term (current) drug therapy: Secondary | ICD-10-CM

## 2014-08-10 LAB — CBC WITH DIFFERENTIAL/PLATELET
BASOS ABS: 0.1 10*3/uL (ref 0.0–0.1)
Basophils Relative: 1 % (ref 0–1)
Eosinophils Absolute: 0.1 10*3/uL (ref 0.0–0.7)
Eosinophils Relative: 1 % (ref 0–5)
HCT: 37.6 % (ref 36.0–46.0)
Hemoglobin: 13.4 g/dL (ref 12.0–15.0)
Lymphocytes Relative: 51 % — ABNORMAL HIGH (ref 12–46)
Lymphs Abs: 4.9 10*3/uL — ABNORMAL HIGH (ref 0.7–4.0)
MCH: 33.3 pg (ref 26.0–34.0)
MCHC: 35.6 g/dL (ref 30.0–36.0)
MCV: 93.3 fL (ref 78.0–100.0)
MPV: 11.3 fL (ref 8.6–12.4)
Monocytes Absolute: 0.6 10*3/uL (ref 0.1–1.0)
Monocytes Relative: 6 % (ref 3–12)
Neutro Abs: 3.9 10*3/uL (ref 1.7–7.7)
Neutrophils Relative %: 41 % — ABNORMAL LOW (ref 43–77)
PLATELETS: 332 10*3/uL (ref 150–400)
RBC: 4.03 MIL/uL (ref 3.87–5.11)
RDW: 12.6 % (ref 11.5–15.5)
WBC: 9.6 10*3/uL (ref 4.0–10.5)

## 2014-08-10 NOTE — Progress Notes (Signed)
Patient ID: Katie Cox, female   DOB: [birth year], 59 y.o.   MRN: [id number]   This very nice 59 y.o. MWF presents for 3 month follow up with Hypertension, Hyperlipidemia, Pre-Diabetes and Vitamin D Deficiency.    Patient is treated for HTN since 2000 & BP has been controlled at home. Today's BP: 132/82 mmHg. Patient has had no complaints of any cardiac type chest pain, palpitations, dyspnea/orthopnea/PND, dizziness, claudication, or dependent edema.   Hyperlipidemia is controlled with diet & meds. Patient denies myalgias or other med SE's. Last Lipids were at goal - Total Chol 176; HDL 62; LDL 93; Trig 107 on 08/10/2014. Patient also has hx/o hyperuricemia and is on treatment.   Also, the patient his screened for PreDiabetes and has had no symptoms of reactive hypoglycemia, diabetic polys, paresthesias or visual blurring.  Last A1c was 5.0% on  08/10/2014.   Further, the patient also has history of Vitamin D Deficiency and supplements vitamin D without any suspected side-effects. Last vitamin D was  76 on  08/10/2014.  Medication Sig  . amitriptyline (ELAVIL) 10 MG tablet Take 1 to 3 tablets at bedtime for neuropathy  . aspirin 81 MG tablet Take 81 mg by mouth daily.  . bumetanide (BUMEX) 2 MG tablet TAKE 1 TABLET TWICE A DAY FOR BLOOD PRESSURE AND FLUID  . Cholecalciferol (VITAMIN D PO) Take 2,000 Units by mouth 3 (three) times daily.   Marland Kitchen dicyclomine (BENTYL) 20 MG tablet TAKE 1 TABLET BY MOUTH TWICE A DAY FOR ABDOMINAL CRAMPS & BLOATING  . febuxostat (ULORIC) 40 MG tablet Take 1 tablet (40 mg total) by mouth daily.  . magnesium 30 MG tablet Take 30 mg by mouth 2 (two) times daily.  . Multiple Vitamin (MULTIVITAMIN) tablet Take 1 tablet by mouth daily.  . potassium chloride SA (K-DUR,KLOR-CON) 20 MEQ tablet Take 1 tablet (20 mEq total) by mouth 2 (two) times daily.  . ranitidine (ZANTAC) 300 MG tablet TAKE 1 TABLET DAILY FOR ACID REFLUX  . promethazine-codeine (PHENERGAN WITH CODEINE) 6.25-10  MG/5ML syrup Take 5 mLs by mouth every 6 (six) hours as needed for cough. Max: 81mL per day   Allergies  Allergen Reactions  . Alprazolam   . Prednisone     Only high doses  . Allopurinol Rash   PMHx:   Past Medical History  Diagnosis Date  . Contact lens/glasses fitting     wears contacts or glasses  . GERD (gastroesophageal reflux disease)   . Hyperlipidemia   . Hypertension   . Vitamin D deficiency   . Peripheral neuropathy    Immunization History  Administered Date(s) Administered  . PPD Test 01/25/2014  . Pneumococcal Polysaccharide-23 11/23/2008  . Tdap 11/29/2009   Past Surgical History  Procedure Laterality Date  . Goiter removed fro thyroid    . Foot surgery      both feet  . Colonoscopy    . Cholecystectomy  04/14/2012    Procedure: LAPAROSCOPIC CHOLECYSTECTOMY;  Surgeon: Rolm Bookbinder, MD;  Location: [facility];  Service: General;  Laterality: N/A;   FHx:    Reviewed / unchanged  SHx:    Reviewed / unchanged  Systems Review:  Constitutional: Denies fever, chills, wt changes, headaches, insomnia, fatigue, night sweats, change in appetite. Eyes: Denies redness, blurred vision, diplopia, discharge, itchy, watery eyes.  ENT: Denies discharge, congestion, post nasal drip, epistaxis, sore throat, earache, hearing loss, dental pain, tinnitus, vertigo, sinus pain, snoring.  CV: Denies chest pain, palpitations, irregular heartbeat,  syncope, dyspnea, diaphoresis, orthopnea, PND, claudication or edema. Respiratory: denies cough, dyspnea, DOE, pleurisy, hoarseness, laryngitis, wheezing.  Gastrointestinal: Denies dysphagia, odynophagia, heartburn, reflux, water brash, abdominal pain or cramps, nausea, vomiting, bloating, diarrhea, constipation, hematemesis, melena, hematochezia  or hemorrhoids. Genitourinary: Denies dysuria, frequency, urgency, nocturia, hesitancy, discharge, hematuria or flank pain. Musculoskeletal: Denies arthralgias, myalgias,  stiffness, jt. swelling, pain, limping or strain/sprain.  Skin: Denies pruritus, rash, hives, warts, acne, eczema or change in skin lesion(s). Neuro: No weakness, tremor, incoordination, spasms, paresthesia or pain. Psychiatric: Denies confusion, memory loss or sensory loss. Endo: Denies change in weight, skin or hair change.  Heme/Lymph: No excessive bleeding, bruising or enlarged lymph nodes.  Physical Exam  BP 132/82   Pulse 60  Temp 97.7 F   Resp 18  Ht 5\' 7"    Wt 153 lb     BMI 23.99   Appears well nourished and in no distress. Eyes: PERRLA, EOMs, conjunctiva no swelling or erythema. Sinuses: No frontal/maxillary tenderness ENT/Mouth: EAC's clear, TM's nl w/o erythema, bulging. Nares clear w/o erythema, swelling, exudates. Oropharynx clear without erythema or exudates. Oral hygiene is good. Tongue normal, non obstructing. Hearing intact.  Neck: Supple. Thyroid nl. Car 2+/2+ without bruits, nodes or JVD. Chest: Respirations nl with BS clear & equal w/o rales, rhonchi, wheezing or stridor.  Cor: Heart sounds normal w/ regular rate and rhythm without sig. murmurs, gallops, clicks, or rubs. Peripheral pulses normal and equal  without edema.  Abdomen: Soft & bowel sounds normal. Non-tender w/o guarding, rebound, hernias, masses, or organomegaly.  Lymphatics: Unremarkable.  Musculoskeletal: Full ROM all peripheral extremities, joint stability, 5/5 strength, and normal gait.  Skin: Warm, dry without exposed rashes, lesions or ecchymosis apparent.  Neuro: Cranial nerves intact, reflexes equal bilaterally. Sensory-motor testing grossly intact. Tendon reflexes grossly intact.  Pysch: Alert & oriented x 3.  Insight and judgement nl & appropriate. No ideations.  Assessment and Plan:  1. Hypertension - Continue monitor blood pressure at home. Continue diet/meds same.  2. Hyperlipidemia - Continue diet/meds, exercise,& lifestyle modifications. Continue monitor periodic cholesterol/liver &  renal functions   3. Pre-Diabetes - Continue diet, exercise, lifestyle modifications. Monitor appropriate labs.  4. Vitamin D Deficiency - Continue supplementation.   Recommended regular exercise, BP monitoring, weight control, and discussed med and SE's. Recommended labs to assess and monitor clinical status. Further disposition pending results of labs.

## 2014-08-10 NOTE — Patient Instructions (Signed)

## 2014-08-11 LAB — HEPATIC FUNCTION PANEL
ALBUMIN: 4.3 g/dL (ref 3.5–5.2)
ALT: 13 U/L (ref 0–35)
AST: 19 U/L (ref 0–37)
Alkaline Phosphatase: 70 U/L (ref 39–117)
BILIRUBIN TOTAL: 1 mg/dL (ref 0.2–1.2)
Bilirubin, Direct: 0.2 mg/dL (ref 0.0–0.3)
Indirect Bilirubin: 0.8 mg/dL (ref 0.2–1.2)
Total Protein: 7.1 g/dL (ref 6.0–8.3)

## 2014-08-11 LAB — BASIC METABOLIC PANEL WITH GFR
BUN: 24 mg/dL — AB (ref 6–23)
CO2: 31 mEq/L (ref 19–32)
Calcium: 9.8 mg/dL (ref 8.4–10.5)
Chloride: 99 mEq/L (ref 96–112)
Creat: 0.87 mg/dL (ref 0.50–1.10)
GFR, EST NON AFRICAN AMERICAN: 73 mL/min
GFR, Est African American: 84 mL/min
GLUCOSE: 93 mg/dL (ref 70–99)
POTASSIUM: 3.6 meq/L (ref 3.5–5.3)
Sodium: 141 mEq/L (ref 135–145)

## 2014-08-11 LAB — LIPID PANEL
CHOLESTEROL: 176 mg/dL (ref 0–200)
HDL: 62 mg/dL (ref >39)
LDL CALC: 93 mg/dL (ref 0–99)
TRIGLYCERIDES: 107 mg/dL (ref <150)
Total CHOL/HDL Ratio: 2.8 Ratio
VLDL: 21 mg/dL (ref 0–40)

## 2014-08-11 LAB — TSH: TSH: 1.179 u[IU]/mL (ref 0.350–4.500)

## 2014-08-11 LAB — HEMOGLOBIN A1C
Hgb A1c MFr Bld: 5 % (ref <5.7)
MEAN PLASMA GLUCOSE: 97 mg/dL (ref <117)

## 2014-08-11 LAB — MAGNESIUM: Magnesium: 1.8 mg/dL (ref 1.5–2.5)

## 2014-08-11 LAB — VITAMIN D 25 HYDROXY (VIT D DEFICIENCY, FRACTURES): Vit D, 25-Hydroxy: 76 ng/mL (ref 30–100)

## 2014-08-11 LAB — INSULIN, FASTING: Insulin fasting, serum: 10.6 u[IU]/mL (ref 2.0–19.6)

## 2014-08-13 ENCOUNTER — Encounter: Payer: Self-pay | Admitting: Internal Medicine

## 2014-08-13 DIAGNOSIS — E790 Hyperuricemia without signs of inflammatory arthritis and tophaceous disease: Secondary | ICD-10-CM

## 2014-08-13 HISTORY — DX: Hyperuricemia without signs of inflammatory arthritis and tophaceous disease: E79.0

## 2014-09-08 ENCOUNTER — Other Ambulatory Visit: Payer: Self-pay

## 2014-09-08 DIAGNOSIS — Z1231 Encounter for screening mammogram for malignant neoplasm of breast: Secondary | ICD-10-CM

## 2014-10-05 ENCOUNTER — Ambulatory Visit
Admission: RE | Admit: 2014-10-05 | Discharge: 2014-10-05 | Disposition: A | Discharge status: Home / Self Care | Payer: 59 | Source: Ambulatory Visit

## 2014-10-05 DIAGNOSIS — Z1231 Encounter for screening mammogram for malignant neoplasm of breast: Secondary | ICD-10-CM

## 2014-10-11 ENCOUNTER — Other Ambulatory Visit: Payer: Self-pay | Admitting: Internal Medicine

## 2014-11-15 ENCOUNTER — Ambulatory Visit: Payer: Self-pay | Admitting: Physician Assistant

## 2014-11-21 ENCOUNTER — Encounter: Payer: Self-pay | Admitting: Physician Assistant

## 2014-11-21 ENCOUNTER — Ambulatory Visit (INDEPENDENT_AMBULATORY_CARE_PROVIDER_SITE_OTHER): Payer: 59 | Admitting: Physician Assistant

## 2014-11-21 VITALS — BP 122/70 | HR 52 | Temp 97.7°F | Resp 16 | Ht 67.0 in | Wt 149.0 lb

## 2014-11-21 DIAGNOSIS — I10 Essential (primary) hypertension: Secondary | ICD-10-CM

## 2014-11-21 DIAGNOSIS — E559 Vitamin D deficiency, unspecified: Secondary | ICD-10-CM

## 2014-11-21 DIAGNOSIS — Z79899 Other long term (current) drug therapy: Secondary | ICD-10-CM

## 2014-11-21 DIAGNOSIS — E785 Hyperlipidemia, unspecified: Secondary | ICD-10-CM

## 2014-11-21 DIAGNOSIS — G629 Polyneuropathy, unspecified: Secondary | ICD-10-CM

## 2014-11-21 LAB — CBC WITH DIFFERENTIAL/PLATELET
BASOS ABS: 0.1 10*3/uL (ref 0.0–0.1)
Basophils Relative: 1 % (ref 0–1)
EOS ABS: 0.1 10*3/uL (ref 0.0–0.7)
EOS PCT: 1 % (ref 0–5)
HEMATOCRIT: 40.2 % (ref 36.0–46.0)
Hemoglobin: 14.2 g/dL (ref 12.0–15.0)
Lymphocytes Relative: 52 % — ABNORMAL HIGH (ref 12–46)
Lymphs Abs: 4.8 10*3/uL — ABNORMAL HIGH (ref 0.7–4.0)
MCH: 32.6 pg (ref 26.0–34.0)
MCHC: 35.3 g/dL (ref 30.0–36.0)
MCV: 92.4 fL (ref 78.0–100.0)
MONO ABS: 0.6 10*3/uL (ref 0.1–1.0)
MONOS PCT: 7 % (ref 3–12)
MPV: 10.5 fL (ref 8.6–12.4)
NEUTROS PCT: 39 % — AB (ref 43–77)
Neutro Abs: 3.6 10*3/uL (ref 1.7–7.7)
Platelets: 387 10*3/uL (ref 150–400)
RBC: 4.35 MIL/uL (ref 3.87–5.11)
RDW: 12.9 % (ref 11.5–15.5)
WBC: 9.2 10*3/uL (ref 4.0–10.5)

## 2014-11-21 LAB — BASIC METABOLIC PANEL WITH GFR
BUN: 22 mg/dL (ref 6–23)
CALCIUM: 10.1 mg/dL (ref 8.4–10.5)
CO2: 29 mEq/L (ref 19–32)
CREATININE: 0.85 mg/dL (ref 0.50–1.10)
Chloride: 100 mEq/L (ref 96–112)
GFR, EST AFRICAN AMERICAN: 86 mL/min
GFR, Est Non African American: 75 mL/min
Glucose, Bld: 75 mg/dL (ref 70–99)
Potassium: 3.7 mEq/L (ref 3.5–5.3)
SODIUM: 140 meq/L (ref 135–145)

## 2014-11-21 LAB — HEPATIC FUNCTION PANEL
ALBUMIN: 4.6 g/dL (ref 3.5–5.2)
ALT: 19 U/L (ref 0–35)
AST: 27 U/L (ref 0–37)
Alkaline Phosphatase: 67 U/L (ref 39–117)
BILIRUBIN DIRECT: 0.1 mg/dL (ref 0.0–0.3)
Indirect Bilirubin: 0.8 mg/dL (ref 0.2–1.2)
TOTAL PROTEIN: 7.4 g/dL (ref 6.0–8.3)
Total Bilirubin: 0.9 mg/dL (ref 0.2–1.2)

## 2014-11-21 LAB — LIPID PANEL
CHOLESTEROL: 180 mg/dL (ref 0–200)
HDL: 60 mg/dL (ref >=46)
LDL CALC: 99 mg/dL (ref 0–99)
Total CHOL/HDL Ratio: 3 Ratio
Triglycerides: 104 mg/dL (ref <150)
VLDL: 21 mg/dL (ref 0–40)

## 2014-11-21 LAB — MAGNESIUM: Magnesium: 2.1 mg/dL (ref 1.5–2.5)

## 2014-11-21 LAB — TSH: TSH: 1.586 u[IU]/mL (ref 0.350–4.500)

## 2014-11-21 MED ORDER — LUBIPROSTONE 24 MCG PO CAPS: 24.0000 ug | ORAL_CAPSULE | Freq: Two times a day (BID) | ORAL | Status: DC

## 2014-11-21 NOTE — Progress Notes (Signed)
Assessment and Plan:  1. Hypertension -Continue medication, monitor blood pressure at home. Continue DASH diet.  Reminder to go to the ER if any CP, SOB, nausea, dizziness, severe HA, changes vision/speech, left arm numbness and tingling and jaw pain.  2. Cholesterol -Continue diet and exercise. Check cholesterol.   3. Idiopathic peripheral neuropathy Increase elavil to 3 at night  4. Vitamin D Def - check level and continue medications.   5. IBS-C Stop the bentyl, try amitza samples Just recently saw OB/GYN  Continue diet and meds as discussed. Further disposition pending results of labs. Over 30 minutes of exam, counseling, chart review, and critical decision making was performed  Future Appointments Date Time Provider [place]  02/05/2015 3:00 PM Unk Pinto, MD [facility] None    HPI 59 y.o. female  presents for 3 month follow up on hypertension, cholesterol, and vitamin D deficiency.   Her blood pressure has been controlled at home, she is on bumex and potassium, today their BP is BP: 122/70 mmHg  She does not workout. She denies chest pain, shortness of breath, dizziness.  She is not on cholesterol medication and denies myalgias. Her cholesterol is at goal. The cholesterol last visit was:   Lab Results  Component Value Date   CHOL 176 08/10/2014   HDL 62 08/10/2014   LDLCALC 93 08/10/2014   TRIG 107 08/10/2014   CHOLHDL 2.8 08/10/2014    Last A1C in the office was:  Lab Results  Component Value Date   HGBA1C 5.0 08/10/2014   Patient is on Vitamin D supplement.   Lab Results  Component Value Date   VD25OH 76 08/10/2014     She has idiopathic peripheral neuropathy, on amitriptyline. Patient is on allopurinol for gout and does not report a recent flare.  She states she has had increasing gas/abdominal discomfort, bentyl helps but she is very constipated.     Current Medications:  Current Outpatient Prescriptions on File Prior to Visit  Medication  Sig Dispense Refill  . amitriptyline (ELAVIL) 10 MG tablet Take 1 to 3 tablets at bedtime for neuropathy 90 tablet 99  . aspirin 81 MG tablet Take 81 mg by mouth daily.    . bumetanide (BUMEX) 2 MG tablet TAKE 1 TABLET TWICE A DAY FOR BLOOD PRESSURE AND FLUID 180 tablet 1  . Cholecalciferol (VITAMIN D PO) Take 2,000 Units by mouth 3 (three) times daily.     Marland Kitchen dicyclomine (BENTYL) 20 MG tablet TAKE 1 TABLET BY MOUTH TWICE A DAY FOR ABDOMINAL CRAMPS & BLOATING 60 tablet 8  . febuxostat (ULORIC) 40 MG tablet Take 1 tablet (40 mg total) by mouth daily. 90 tablet 3  . magnesium 30 MG tablet Take 30 mg by mouth 2 (two) times daily.    . Multiple Vitamin (MULTIVITAMIN) tablet Take 1 tablet by mouth daily.    . potassium chloride SA (K-DUR,KLOR-CON) 20 MEQ tablet Take 1 tablet (20 mEq total) by mouth 2 (two) times daily. 180 tablet 3  . ranitidine (ZANTAC) 300 MG tablet TAKE 1 TABLET DAILY FOR ACID REFLUX 90 tablet 99   No current facility-administered medications on file prior to visit.   Medical History:  Past Medical History  Diagnosis Date  . Contact lens/glasses fitting     wears contacts or glasses  . GERD (gastroesophageal reflux disease)   . Hyperlipidemia   . Hypertension   . Vitamin D deficiency   . Peripheral neuropathy    Allergies:  Allergies  Allergen Reactions  .  Alprazolam   . Prednisone     Only high doses  . Allopurinol Rash     Review of Systems:  Review of Systems  Constitutional: Negative.   HENT: Positive for congestion. Negative for ear discharge, ear pain, hearing loss, nosebleeds, sore throat and tinnitus.   Eyes: Negative.   Respiratory: Negative.  Negative for stridor.   Cardiovascular: Negative.   Gastrointestinal: Positive for abdominal pain and constipation. Negative for heartburn, nausea, vomiting, diarrhea, blood in stool and melena.  Genitourinary: Negative.   Musculoskeletal: Negative.   Skin: Negative.   Neurological: Negative.  Negative for  headaches.  Endo/Heme/Allergies: Negative.   Psychiatric/Behavioral: Negative.     Family history- Review and unchanged Social history- Review and unchanged Physical Exam: BP 122/70 mmHg  Pulse 52  Temp(Src) 97.7 F (36.5 C)  Resp 16  Ht 5\' 7"  (1.702 m)  Wt 149 lb (67.586 kg)  BMI 23.33 kg/m2 Wt Readings from Last 3 Encounters:  11/21/14 149 lb (67.586 kg)  08/10/14 153 lb 3.2 oz (69.491 kg)  07/05/14 150 lb (68.04 kg)   General Appearance: Well nourished, in no apparent distress. Eyes: PERRLA, EOMs, conjunctiva no swelling or erythema Sinuses: No Frontal/maxillary tenderness ENT/Mouth: Ext aud canals clear, TMs without erythema, bulging. No erythema, swelling, or exudate on post pharynx.  Tonsils not swollen or erythematous. Hearing normal.  Neck: Supple, thyroid normal.  Respiratory: Respiratory effort normal, BS equal bilaterally without rales, rhonchi, wheezing or stridor.  Cardio: RRR with no MRGs. Brisk peripheral pulses without edema.  Abdomen: Soft, + BS,  Non tender, no guarding, rebound, hernias, masses. Lymphatics: Non tender without lymphadenopathy.  Musculoskeletal: Full ROM, 5/5 strength, Normal gait Skin: Warm, dry without rashes, lesions, ecchymosis.  Neuro: Cranial nerves intact. Normal muscle tone, no cerebellar symptoms. Psych: Awake and oriented X 3, normal affect, Insight and Judgment appropriate.    Vicie Mutters, PA-C 4:24 PM [facility]

## 2014-11-21 NOTE — Patient Instructions (Addendum)
Your ears and sinuses are connected by the eustachian tube. When your sinuses are inflamed, this can close off the tube and cause fluid to collect in your middle ear. This can then cause dizziness, popping, clicking, ringing, and echoing in your ears. This is often NOT an infection and does NOT require antibiotics, it is caused by inflammation so the treatments help the inflammation. This can take a long time to get better so please be patient.  Here are things you can do to help with this: - Try the Flonase or Nasonex. Remember to spray each nostril twice towards the outer part of your eye.  Do not sniff but instead pinch your nose and tilt your head back to help the medicine get into your sinuses.  The best time to do this is at bedtime.Stop if you get blurred vision or nose bleeds.  -While drinking fluids, pinch and hold nose close and swallow, to help open eustachian tubes to drain fluid behind ear drums. -Please pick one of the over the counter allergy medications below and take it once daily for allergies.  It will also help with fluid behind ear drums. Claritin or loratadine cheapest but likely the weakest  Zyrtec or certizine at night because it can make you sleepy The strongest is allegra or fexafinadine  Cheapest at [employer], [employer], [employer] -can use decongestant over the counter, please do not use if you have high blood pressure or certain heart conditions.   if worsening HA, changes vision/speech, imbalance, weakness go to the ER   Peripheral Neuropathy Peripheral neuropathy is a type of nerve damage. It affects nerves that carry signals between the spinal cord and other parts of the body. These are called peripheral nerves. With peripheral neuropathy, one nerve or a group of nerves may be damaged.  CAUSES  Many things can damage peripheral nerves. For some people with peripheral neuropathy, the cause is unknown. Some causes include:  Diabetes. This is the most common cause of peripheral  neuropathy.  Injury to a nerve.  Pressure or stress on a nerve that lasts a long time.  Too little vitamin B. Alcoholism can lead to this.  Infections.  Autoimmune diseases, such as multiple sclerosis and systemic lupus erythematosus.  Inherited nerve diseases.  Some medicines, such as cancer drugs.  Toxic substances, such as lead and mercury.  Too little blood flowing to the legs.  Kidney disease.  Thyroid disease. SIGNS AND SYMPTOMS  Different people have different symptoms. The symptoms you have will depend on which of your nerves is damaged. Common symptoms include:  Loss of feeling (numbness) in the feet and hands.  Tingling in the feet and hands.  Pain that burns.  Very sensitive skin.  Weakness.  Not being able to move a part of the body (paralysis).  Muscle twitching.  Clumsiness or poor coordination.  Loss of balance.  Not being able to control your bladder.  Feeling dizzy.  Sexual problems. DIAGNOSIS  Peripheral neuropathy is a symptom, not a disease. Finding the cause of peripheral neuropathy can be hard. To figure that out, your health care provider will take a medical history and do a physical exam. A neurological exam will also be done. This involves checking things affected by your brain, spinal cord, and nerves (nervous system). For example, your health care provider will check your reflexes, how you move, and what you can feel.  Other types of tests may also be ordered, such as:  Blood tests.  A test of  the fluid in your spinal cord.  Imaging tests, such as CT scans or an MRI.  Electromyography (EMG). This test checks the nerves that control muscles.  Nerve conduction velocity tests. These tests check how fast messages pass through your nerves.  Nerve biopsy. A small piece of nerve is removed. It is then checked under a microscope. TREATMENT   Medicine is often used to treat peripheral neuropathy. Medicines may  include:  Pain-relieving medicines. Prescription or over-the-counter medicine may be suggested.  Antiseizure medicine. This may be used for pain.  Antidepressants. These also may help ease pain from neuropathy.  Lidocaine. This is a numbing medicine. You might wear a patch or be given a shot.  Mexiletine. This medicine is typically used to help control irregular heart rhythms.  Surgery. Surgery may be needed to relieve pressure on a nerve or to destroy a nerve that is causing pain.  Physical therapy to help movement.  Assistive devices to help movement. HOME CARE INSTRUCTIONS   Only take over-the-counter or prescription medicines as directed by your health care provider. Follow the instructions carefully for any given medicines. Do not take any other medicines without first getting approval from your health care provider.  If you have diabetes, work closely with your health care provider to keep your blood sugar under control.  If you have numbness in your feet:  Check every day for signs of injury or infection. Watch for redness, warmth, and swelling.  Wear padded socks and comfortable shoes. These help protect your feet.  Do not do things that put pressure on your damaged nerve.  Do not smoke. Smoking keeps blood from getting to damaged nerves.  Avoid or limit alcohol. Too much alcohol can cause a lack of B vitamins. These vitamins are needed for healthy nerves.  Develop a good support system. Coping with peripheral neuropathy can be stressful. Talk to a mental health specialist or join a support group if you are struggling.  Follow up with your health care provider as directed. SEEK MEDICAL CARE IF:   You have new signs or symptoms of peripheral neuropathy.  You are struggling emotionally from dealing with peripheral neuropathy.  You have a fever. SEEK IMMEDIATE MEDICAL CARE IF:   You have an injury or infection that is not healing.  You feel very dizzy or begin  vomiting.  You have chest pain.  You have trouble breathing. Document Released: 05/30/2002 Document Revised: 02/19/2011 Document Reviewed: 02/14/2013 [employer] Patient Information 2015 [place], [place]. This information is not intended to replace advice given to you by your health care provider. Make sure you discuss any questions you have with your health care provider.

## 2014-11-22 LAB — VITAMIN D 25 HYDROXY (VIT D DEFICIENCY, FRACTURES): VIT D 25 HYDROXY: 76 ng/mL (ref 30–100)

## 2014-12-12 ENCOUNTER — Other Ambulatory Visit: Payer: Self-pay

## 2014-12-12 MED ORDER — LUBIPROSTONE 24 MCG PO CAPS: 24.0000 ug | ORAL_CAPSULE | Freq: Two times a day (BID) | ORAL | Status: DC

## 2014-12-27 ENCOUNTER — Encounter: Payer: Self-pay | Admitting: Internal Medicine

## 2015-02-05 ENCOUNTER — Encounter: Payer: Self-pay | Admitting: Internal Medicine

## 2015-02-05 ENCOUNTER — Ambulatory Visit (INDEPENDENT_AMBULATORY_CARE_PROVIDER_SITE_OTHER): Payer: 59 | Admitting: Internal Medicine

## 2015-02-05 VITALS — BP 108/66 | HR 52 | Temp 97.7°F | Resp 16 | Ht 67.0 in | Wt 148.6 lb

## 2015-02-05 DIAGNOSIS — Z111 Encounter for screening for respiratory tuberculosis: Secondary | ICD-10-CM

## 2015-02-05 DIAGNOSIS — E790 Hyperuricemia without signs of inflammatory arthritis and tophaceous disease: Secondary | ICD-10-CM

## 2015-02-05 DIAGNOSIS — Z1212 Encounter for screening for malignant neoplasm of rectum: Secondary | ICD-10-CM

## 2015-02-05 DIAGNOSIS — Z79899 Other long term (current) drug therapy: Secondary | ICD-10-CM

## 2015-02-05 DIAGNOSIS — R7309 Other abnormal glucose: Secondary | ICD-10-CM

## 2015-02-05 DIAGNOSIS — K219 Gastro-esophageal reflux disease without esophagitis: Secondary | ICD-10-CM

## 2015-02-05 DIAGNOSIS — Z0000 Encounter for general adult medical examination without abnormal findings: Secondary | ICD-10-CM

## 2015-02-05 DIAGNOSIS — R5383 Other fatigue: Secondary | ICD-10-CM

## 2015-02-05 DIAGNOSIS — I10 Essential (primary) hypertension: Secondary | ICD-10-CM

## 2015-02-05 DIAGNOSIS — Z6823 Body mass index (BMI) 23.0-23.9, adult: Secondary | ICD-10-CM

## 2015-02-05 DIAGNOSIS — E785 Hyperlipidemia, unspecified: Secondary | ICD-10-CM

## 2015-02-05 DIAGNOSIS — J0110 Acute frontal sinusitis, unspecified: Secondary | ICD-10-CM

## 2015-02-05 DIAGNOSIS — K589 Irritable bowel syndrome without diarrhea: Secondary | ICD-10-CM

## 2015-02-05 DIAGNOSIS — E559 Vitamin D deficiency, unspecified: Secondary | ICD-10-CM

## 2015-02-05 HISTORY — DX: Other abnormal glucose: R73.09

## 2015-02-05 LAB — CBC WITH DIFFERENTIAL/PLATELET
BASOS ABS: 0.1 10*3/uL (ref 0.0–0.1)
BASOS PCT: 1 % (ref 0–1)
EOS ABS: 0.1 10*3/uL (ref 0.0–0.7)
EOS PCT: 1 % (ref 0–5)
HCT: 40.5 % (ref 36.0–46.0)
Hemoglobin: 14.2 g/dL (ref 12.0–15.0)
Lymphocytes Relative: 44 % (ref 12–46)
Lymphs Abs: 5.1 10*3/uL — ABNORMAL HIGH (ref 0.7–4.0)
MCH: 32.4 pg (ref 26.0–34.0)
MCHC: 35.1 g/dL (ref 30.0–36.0)
MCV: 92.5 fL (ref 78.0–100.0)
MONO ABS: 0.7 10*3/uL (ref 0.1–1.0)
MPV: 10.5 fL (ref 8.6–12.4)
Monocytes Relative: 6 % (ref 3–12)
Neutro Abs: 5.5 10*3/uL (ref 1.7–7.7)
Neutrophils Relative %: 48 % (ref 43–77)
PLATELETS: 386 10*3/uL (ref 150–400)
RBC: 4.38 MIL/uL (ref 3.87–5.11)
RDW: 13 % (ref 11.5–15.5)
WBC: 11.5 10*3/uL — AB (ref 4.0–10.5)

## 2015-02-05 MED ORDER — LUBIPROSTONE 8 MCG PO CAPS: ORAL_CAPSULE | ORAL | Status: DC

## 2015-02-05 MED ORDER — DEXAMETHASONE 0.5 MG PO TABS: ORAL_TABLET | ORAL | Status: DC

## 2015-02-05 MED ORDER — AZITHROMYCIN 250 MG PO TABS: ORAL_TABLET | ORAL | Status: DC

## 2015-02-05 NOTE — Patient Instructions (Signed)
Recommend Adult Low dose Aspirin or coated  Aspirin 81 mg daily   To reduce risk of Colon Cancer 20 %,   Skin Cancer 26 % ,   Melanoma 46%   and   Pancreatic cancer 60% ++++++++++++++++++ Vitamin D goal is between 70-100.   Please make sure that you are taking your Vitamin D as directed.   It is very important as a natural anti-inflammatory   helping hair, skin, and nails, as well as reducing stroke and heart attack risk.   It helps your bones and helps with mood.  It also decreases numerous cancer risks so please take it as directed.   Low Vit D is associated with a 200-300% higher risk for CANCER   and 200-300% higher risk for HEART   ATTACK  &  STROKE.   .....................................Marland Kitchen  It is also associated with higher death rate at younger ages,   autoimmune diseases like Rheumatoid arthritis, Lupus, Multiple Sclerosis.     Also many other serious conditions, like depression, Alzheimer's  Dementia, infertility, muscle aches, fatigue, fibromyalgia - just to name a few.  +++++++++++++++++++  Recommend the book "The END of DIETING" by Dr Excell Seltzer   & the book "The END of DIABETES " by Dr Excell Seltzer  At [email or web address] - get book & Audio CD's     Being diabetic has a  300% increased risk for heart attack, stroke, cancer, and alzheimer- type vascular dementia. It is very important that you work harder with diet by avoiding all foods that are white. Avoid white rice (brown & wild rice is OK), white potatoes (sweetpotatoes in moderation is OK), White bread or wheat bread or anything made out of white flour like bagels, donuts, rolls, buns, biscuits, cakes, pastries, cookies, pizza crust, and pasta (made from white flour & egg whites) - vegetarian pasta or spinach or wheat pasta is OK. Multigrain breads like Arnold's or Pepperidge [employer], or multigrain sandwich thins or flatbreads.  Diet, exercise and weight loss can reverse and cure diabetes in the early stages.   Diet, exercise and weight loss is very important in the control and prevention of complications of diabetes which affects every system in your body, ie. Brain - dementia/stroke, eyes - glaucoma/blindness, heart - heart attack/heart failure, kidneys - dialysis, stomach - gastric paralysis, intestines - malabsorption, nerves - severe painful neuritis, circulation - gangrene & loss of a leg(s), and finally cancer and Alzheimers.    I recommend avoid fried & greasy foods,  sweets/candy, white rice (brown or wild rice or Quinoa is OK), white potatoes (sweet potatoes are OK) - anything made from white flour - bagels, doughnuts, rolls, buns, biscuits,white and wheat breads, pizza crust and traditional pasta made of white flour & egg white(vegetarian pasta or spinach or wheat pasta is OK).  Multi-grain bread is OK - like multi-grain flat bread or sandwich thins. Avoid alcohol in excess. Exercise is also important.    Eat all the vegetables you want - avoid meat, especially red meat and dairy - especially cheese.  Cheese is the most concentrated form of trans-fats which is the worst thing to clog up our arteries. Veggie cheese is OK which can be found in the fresh produce section at [employer] or [employer] or [employer]  ++++++++++++++++++++++++++  Preventative Care for Adults - Female      MAINTAIN REGULAR HEALTH EXAMS:  A routine yearly physical is a good way to check in with your primary care provider about your health and  preventive screening. It is also an opportunity to share updates about your health and any concerns you have, and receive a thorough all-over exam.   Most health insurance companies pay for at least some preventative services.  Check with your health plan for specific coverages.  WHAT PREVENTATIVE SERVICES DO WOMEN NEED?  Adult women should have their weight and blood pressure checked regularly.   Women age 81 and older should have their cholesterol levels checked  regularly.  Women should be screened for cervical cancer with a Pap smear and pelvic exam beginning at either age 53, or 3 years after they become sexually activity.    Breast cancer screening generally begins at age 14 with a mammogram and breast exam by your primary care provider.    Beginning at age 64 and continuing to age 32, women should be screened for colorectal cancer.  Certain people may need continued testing until age 26.  Updating vaccinations is part of preventative care.  Vaccinations help protect against diseases such as the flu.  Osteoporosis is a disease in which the bones lose minerals and strength as we age. Women ages 43 and over should discuss this with their caregivers, as should women after menopause who have other risk factors.  Lab tests are generally done as part of preventative care to screen for anemia and blood disorders, to screen for problems with the kidneys and liver, to screen for bladder problems, to check blood sugar, and to check your cholesterol level.  Preventative services generally include counseling about diet, exercise, avoiding tobacco, drugs, excessive alcohol consumption, and sexually transmitted infections.    GENERAL RECOMMENDATIONS FOR GOOD HEALTH:  Healthy diet:  Eat a variety of foods, including fruit, vegetables, animal or vegetable protein, such as meat, fish, chicken, and eggs, or beans, lentils, tofu, and grains, such as rice.  Drink plenty of water daily.  Decrease saturated fat in the diet, avoid lots of red meat, processed foods, sweets, fast foods, and fried foods.  Exercise:  Aerobic exercise helps maintain good heart health. At least 30-40 minutes of moderate-intensity exercise is recommended. For example, a brisk walk that increases your heart rate and breathing. This should be done on most days of the week.   Find a type of exercise or a variety of exercises that you enjoy so that it becomes a part of your daily life.   Examples are running, walking, swimming, water aerobics, and biking.  For motivation and support, explore group exercise such as aerobic class, spin class, Zumba, Yoga,or  martial arts, etc.    Set exercise goals for yourself, such as a certain weight goal, walk or run in a race such as a 5k walk/run.  Speak to your primary care provider about exercise goals.  Disease prevention:  If you smoke or chew tobacco, find out from your caregiver how to quit. It can literally save your life, no matter how long you have been a tobacco user. If you do not use tobacco, never begin.   Maintain a healthy diet and normal weight. Increased weight leads to problems with blood pressure and diabetes.   The Body Mass Index or BMI is a way of measuring how much of your body is fat. Having a BMI above 27 increases the risk of heart disease, diabetes, hypertension, stroke and other problems related to obesity. Your caregiver can help determine your BMI and based on it develop an exercise and dietary program to help you achieve or maintain this important measurement  at a healthful level.  High blood pressure causes heart and blood vessel problems.  Persistent high blood pressure should be treated with medicine if weight loss and exercise do not work.   Fat and cholesterol leaves deposits in your arteries that can block them. This causes heart disease and vessel disease elsewhere in your body.  If your cholesterol is found to be high, or if you have heart disease or certain other medical conditions, then you may need to have your cholesterol monitored frequently and be treated with medication.   Ask if you should have a cardiac stress test if your history suggests this. A stress test is a test done on a treadmill that looks for heart disease. This test can find disease prior to there being a problem.  Menopause can be associated with physical symptoms and risks. Hormone replacement therapy is available to decrease these.  You should talk to your caregiver about whether starting or continuing to take hormones is right for you.   Osteoporosis is a disease in which the bones lose minerals and strength as we age. This can result in serious bone fractures. Risk of osteoporosis can be identified using a bone density scan. Women ages 72 and over should discuss this with their caregivers, as should women after menopause who have other risk factors. Ask your caregiver whether you should be taking a calcium supplement and Vitamin D, to reduce the rate of osteoporosis.   Avoid drinking alcohol in excess (more than two drinks per day).  Avoid use of street drugs. Do not share needles with anyone. Ask for professional help if you need assistance or instructions on stopping the use of alcohol, cigarettes, and/or drugs.  Brush your teeth twice a day with fluoride toothpaste, and floss once a day. Good oral hygiene prevents tooth decay and gum disease. The problems can be painful, unattractive, and can cause other health problems. Visit your dentist for a routine oral and dental check up and preventive care every 6-12 months.   Look at your skin regularly.  Use a mirror to look at your back. Notify your caregivers of changes in moles, especially if there are changes in shapes, colors, a size larger than a pencil eraser, an irregular border, or development of new moles.  Safety:  Use seatbelts 100% of the time, whether driving or as a passenger.  Use safety devices such as hearing protection if you work in environments with loud noise or significant background noise.  Use safety glasses when doing any work that could send debris in to the eyes.  Use a helmet if you ride a bike or motorcycle.  Use appropriate safety gear for contact sports.  Talk to your caregiver about gun safety.  Use sunscreen with a SPF (or skin protection factor) of 15 or greater.  Lighter skinned people are at a greater risk of skin cancer. Don't forget to also  wear sunglasses in order to protect your eyes from too much damaging sunlight. Damaging sunlight can accelerate cataract formation.   Practice safe sex. Use condoms. Condoms are used for birth control and to help reduce the spread of sexually transmitted infections (or STIs).  Some of the STIs are gonorrhea (the clap), chlamydia, syphilis, trichomonas, herpes, HPV (human papilloma virus) and HIV (human immunodeficiency virus) which causes AIDS. The herpes, HIV and HPV are viral illnesses that have no cure. These can result in disability, cancer and death.   Keep carbon monoxide and smoke detectors in your home functioning  at all times. Change the batteries every 6 months or use a model that plugs into the wall.   Vaccinations:  Stay up to date with your tetanus shots and other required immunizations. You should have a booster for tetanus every 10 years. Be sure to get your flu shot every year, since 5%-20% of the [place] population comes down with the flu. The flu vaccine changes each year, so being vaccinated once is not enough. Get your shot in the fall, before the flu season peaks.   Other vaccines to consider:  Human Papilloma Virus or HPV causes cancer of the cervix, and other infections that can be transmitted from person to person. There is a vaccine for HPV, and females should get immunized between the ages of 51 and 82. It requires a series of 3 shots.   Pneumococcal vaccine to protect against certain types of pneumonia.  This is normally recommended for adults age 58 or older.  However, adults younger than 60 years old with certain underlying conditions such as diabetes, heart or lung disease should also receive the vaccine.  Shingles vaccine to protect against Varicella Zoster if you are older than age 51, or younger than 60 years old with certain underlying illness.  Hepatitis A vaccine to protect against a form of infection of the liver by a virus acquired from food.  Hepatitis B vaccine  to protect against a form of infection of the liver by a virus acquired from blood or body fluids, particularly if you work in health care.  If you plan to travel internationally, check with your local health department for specific vaccination recommendations.  Cancer Screening:  Breast cancer screening is essential to preventive care for women. All women age 28 and older should perform a breast self-exam every month. At age 45 and older, women should have their caregiver complete a breast exam each year. Women at ages 70 and older should have a mammogram (x-ray film) of the breasts. Your caregiver can discuss how often you need mammograms.    Cervical cancer screening includes taking a Pap smear (sample of cells examined under a microscope) from the cervix (end of the uterus). It also includes testing for HPV (Human Papilloma Virus, which can cause cervical cancer). Screening and a pelvic exam should begin at age 4, or 3 years after a woman becomes sexually active. Screening should occur every year, with a Pap smear but no HPV testing, up to age 50. After age 36, you should have a Pap smear every 3 years with HPV testing, if no HPV was found previously.   Most routine colon cancer screening begins at the age of 76. On a yearly basis, doctors may provide special easy to use take-home tests to check for hidden blood in the stool. Sigmoidoscopy or colonoscopy can detect the earliest forms of colon cancer and is life saving. These tests use a small camera at the end of a tube to directly examine the colon. Speak to your caregiver about this at age 39, when routine screening begins (and is repeated every 5 years unless early forms of pre-cancerous polyps or small growths are found).

## 2015-02-05 NOTE — Progress Notes (Signed)
Patient ID: Katie Cox, female   DOB: [birth year], 59 y.o.   MRN: [id number]   Comprehensive Examination  This very nice 59 y.o. MWF presents for complete physical.  Patient has been followed for HTN, Prediabetes Screening, Hyperlipidemia, Peripheral sensory Neuropathy, Gout and Vitamin D Deficiency. Patient's hyperuricemia is controlled with Uloric as she had an allergic reaction with Allopurinol.    Her HTN predates since 2000. Patient's BP has been controlled at home and patient denies any cardiac symptoms as chest pain, palpitations, shortness of breath, dizziness or ankle swelling. Today's BP: 108/66 mmHg    Patient's hyperlipidemia is controlled with diet and medications. Patient denies myalgias or other medication SE's. Last lipids were at goal -  Cholesterol 180; HDL 60; LDL 99; Triglycerides 104 on 11/21/2014:   Patient has a peripheral Sensory Neuropathy with burning dysthesias of her feet and is screened expectantly for prediabetes/DM and patient denies reactive hypoglycemic symptoms, visual blurring, diabetic polys or paresthesias. Last A1c was 5.0% on 08/10/2014.   Finally, patient has history of Vitamin D Deficiency of 28 in 2008 and last Vitamin D was 76 on 11/21/2014.      Medication Sig  . amitriptyline (ELAVIL) 10 MG tablet Take 1 to 3 tablets at bedtime for neuropathy  . aspirin 81 MG tablet Take 81 mg by mouth daily.  . bumetanide (BUMEX) 2 MG tablet TAKE 1 TABLET TWICE A DAY FOR BLOOD PRESSURE AND FLUID  . Cholecalciferol (VITAMIN D PO) Take 2,000 Units by mouth 3 (three) times daily.   Marland Kitchen dicyclomine (BENTYL) 20 MG tablet TAKE 1 TABLET BY MOUTH TWICE A DAY FOR ABDOMINAL CRAMPS & BLOATING  . febuxostat (ULORIC) 40 MG tablet Take 1 tablet (40 mg total) by mouth daily.  Marland Kitchen lubiprostone (AMITIZA) 24 MCG capsule Take 1 capsule (24 mcg total) by mouth 2 (two) times daily with a meal.  . magnesium 30 MG tablet Take 30 mg by mouth 2 (two) times daily.  . Multiple Vitamin  (MULTIVITAMIN) tablet Take 1 tablet by mouth daily.  . potassium chloride SA (K-DUR,KLOR-CON) 20 MEQ tablet Take 1 tablet (20 mEq total) by mouth 2 (two) times daily.  . ranitidine (ZANTAC) 300 MG tablet TAKE 1 TABLET DAILY FOR ACID REFLUX   No facility-administered medications prior to visit.   Allergies  Allergen Reactions  . Alprazolam   . Prednisone     Only high doses  . Allopurinol Rash   Past Medical History  Diagnosis Date  . Contact lens/glasses fitting     wears contacts or glasses  . GERD (gastroesophageal reflux disease)   . Hyperlipidemia   . Hypertension   . Vitamin D deficiency   . Peripheral neuropathy    Health Maintenance  Topic Date Due  . COLONOSCOPY  11/13/2004  . ZOSTAVAX  11/14/2014  . INFLUENZA VACCINE  01/22/2015  . MAMMOGRAM  10/04/2016  . PAP SMEAR  11/16/2017  . TETANUS/TDAP  11/30/2019  . Hepatitis C Screening  Completed  . HIV Screening  Completed   Immunization History  Administered Date(s) Administered  . PPD Test 01/25/2014  . Pneumococcal Polysaccharide-23 11/23/2008  . Tdap 11/29/2009   Past Surgical History  Procedure Laterality Date  . Goiter removed fro thyroid    . Foot surgery      both feet  . Colonoscopy    . Cholecystectomy  04/14/2012    Procedure: LAPAROSCOPIC CHOLECYSTECTOMY;  Surgeon: Rolm Bookbinder, MD;  Location: [facility];  Service: General;  Laterality: N/A;  Family History  Problem Relation Age of Onset  . Breast cancer Maternal Aunt   . Colon cancer Father   . Heart disease Mother    Social History  Substance Use Topics  . Smoking status: Never Smoker   . Smokeless tobacco: None  . Alcohol Use: No    ROS Constitutional: Denies fever, chills, weight loss/gain, headaches, insomnia,  night sweats, and change in appetite. Does c/o fatigue. Eyes: Denies redness, blurred vision, diplopia, discharge, itchy, watery eyes.  ENT: Denies discharge, congestion, post nasal drip, epistaxis, sore  throat, earache, hearing loss, dental pain, Tinnitus, Vertigo, Sinus pain, snoring.  Cardio: Denies chest pain, palpitations, irregular heartbeat, syncope, dyspnea, diaphoresis, orthopnea, PND, claudication, edema Respiratory: denies cough, dyspnea, DOE, pleurisy, hoarseness, laryngitis, wheezing.  Gastrointestinal: Denies dysphagia, heartburn, reflux, water brash, pain, cramps, nausea, vomiting, bloating, diarrhea, constipation, hematemesis, melena, hematochezia, jaundice, hemorrhoids Genitourinary: Denies dysuria, frequency, urgency, nocturia, hesitancy, discharge, hematuria, flank pain Breast: Breast lumps, nipple discharge, bleeding.  Musculoskeletal: Denies arthralgia, myalgia, stiffness, Jt. Swelling, pain, limp, and strain/sprain. Denies falls. Skin: Denies puritis, rash, hives, warts, acne, eczema, changing in skin lesion Neuro: No weakness, tremor, incoordination, spasms. Has painful burning dysthesias of her feet.  Psychiatric: Denies confusion, memory loss, sensory loss. Denies Depression. Endocrine: Denies change in weight, skin, hair change, nocturia, and paresthesia, diabetic polys, visual blurring, hyper / hypo glycemic episodes.  Heme/Lymph: No excessive bleeding, bruising, enlarged lymph nodes.  Physical Exam  BP 108/66   Pulse 52  Temp 97.7 F   Resp 16  Ht 5\' 7"    Wt 148 lb 9.6 oz     BMI 23.27  General Appearance: Well nourished and in no apparent distress. Eyes: PERRLA, EOMs, conjunctiva no swelling or erythema, normal fundi and vessels. Sinuses: No frontal/maxillary tenderness ENT/Mouth: EACs patent / TMs  nl. Nares clear without erythema, swelling, mucoid exudates. Oral hygiene is good. No erythema, swelling, or exudate. Tongue normal, non-obstructing. Tonsils not swollen or erythematous. Hearing normal.  Neck: Supple, thyroid normal. No bruits, nodes or JVD. Respiratory: Respiratory effort normal.  BS equal and clear bilateral without rales, rhonci, wheezing or  stridor. Cardio: Heart sounds are normal with regular rate and rhythm and no murmurs, rubs or gallops. Peripheral pulses are normal and equal bilaterally without edema. No aortic or femoral bruits. Chest: symmetric with normal excursions and percussion. Breasts: Symmetric, without lumps, nipple discharge, retractions, or fibrocystic changes.  Abdomen: Flat, soft, with bowel sounds. Nontender, no guarding, rebound, hernias, masses, or organomegaly.  Lymphatics: Non tender without lymphadenopathy.  Musculoskeletal: Full ROM all peripheral extremities, joint stability, 5/5 strength, and normal gait. Skin: Warm and dry without rashes, lesions, cyanosis, clubbing or  ecchymosis.  Neuro: Cranial nerves intact, reflexes equal bilaterally. Normal muscle tone, no cerebellar symptoms. Sensation intact.  Pysch: Awake and oriented X 3, normal affect, Insight and Judgment appropriate.   Assessment and Plan  1. Essential hypertension  - Microalbumin / creatinine urine ratio - EKG 12-Lead - [place], RETROPERITNL ABD,  LTD - TSH  2. Hyperlipidemia  - Lipid panel  3. Abnormal glucose  - Hemoglobin A1c - Insulin, random  4. Vitamin D deficiency  - Vit D  25 hydroxy   5. Gastroesophageal reflux disease   6. Irritable bowel syndrome   7. Hyperuricemia  - Uric acid  8. Screening for rectal cancer  - POC Hemoccult Bld/Stl   9. Other fatigue  - Vitamin B12 - Iron and TIBC - TSH  10. Medication management  - Urine Microscopic -  CBC with Differential/Platelet - BASIC METABOLIC PANEL WITH GFR - Hepatic function panel - Magnesium   Continue prudent diet as discussed, weight control, BP monitoring, regular exercise, and medications. Discussed med's effects and SE's. Screening labs and tests as requested with regular follow-up as recommended.  Over 40 minutes of exam, counseling, chart review was performed.

## 2015-02-06 LAB — URINALYSIS, MICROSCOPIC ONLY
Bacteria, UA: NONE SEEN [HPF]
CASTS: NONE SEEN [LPF]
Crystals: NONE SEEN [HPF]
RBC / HPF: NONE SEEN RBC/HPF (ref <=2)
Squamous Epithelial / LPF: NONE SEEN [HPF] (ref <=5)
WBC, UA: NONE SEEN WBC/HPF (ref <=5)
Yeast: NONE SEEN [HPF]

## 2015-02-06 LAB — HEPATIC FUNCTION PANEL
ALT: 30 U/L — ABNORMAL HIGH (ref 6–29)
AST: 36 U/L — ABNORMAL HIGH (ref 10–35)
Albumin: 4.4 g/dL (ref 3.6–5.1)
Alkaline Phosphatase: 74 U/L (ref 33–130)
BILIRUBIN INDIRECT: 0.6 mg/dL (ref 0.2–1.2)
BILIRUBIN TOTAL: 0.8 mg/dL (ref 0.2–1.2)
Bilirubin, Direct: 0.2 mg/dL (ref <=0.2)
TOTAL PROTEIN: 7.8 g/dL (ref 6.1–8.1)

## 2015-02-06 LAB — BASIC METABOLIC PANEL WITH GFR
BUN: 23 mg/dL (ref 7–25)
CALCIUM: 9.9 mg/dL (ref 8.6–10.4)
CO2: 31 mmol/L (ref 20–31)
Chloride: 95 mmol/L — ABNORMAL LOW (ref 98–110)
Creat: 0.91 mg/dL (ref 0.50–0.99)
GFR, EST AFRICAN AMERICAN: 79 mL/min (ref >=60)
GFR, EST NON AFRICAN AMERICAN: 69 mL/min (ref >=60)
GLUCOSE: 79 mg/dL (ref 65–99)
Potassium: 4.2 mmol/L (ref 3.5–5.3)
SODIUM: 143 mmol/L (ref 135–146)

## 2015-02-06 LAB — MICROALBUMIN / CREATININE URINE RATIO
Creatinine, Urine: 16.5 mg/dL
Microalb Creat Ratio: 12.1 mg/g (ref 0.0–30.0)
Microalb, Ur: 0.2 mg/dL (ref <2.0)

## 2015-02-06 LAB — IRON AND TIBC
%SAT: 22 % (ref 20–55)
IRON: 90 ug/dL (ref 42–145)
TIBC: 408 ug/dL (ref 250–470)
UIBC: 318 ug/dL (ref 125–400)

## 2015-02-06 LAB — LIPID PANEL
CHOLESTEROL: 176 mg/dL (ref 125–200)
HDL: 61 mg/dL (ref >=46)
LDL Cholesterol: 84 mg/dL (ref <130)
TRIGLYCERIDES: 155 mg/dL — AB (ref <150)
Total CHOL/HDL Ratio: 2.9 Ratio (ref <=5.0)
VLDL: 31 mg/dL — AB (ref <30)

## 2015-02-06 LAB — INSULIN, RANDOM: INSULIN: 8 u[IU]/mL (ref 2.0–19.6)

## 2015-02-06 LAB — HEMOGLOBIN A1C
Hgb A1c MFr Bld: 5.2 % (ref <5.7)
MEAN PLASMA GLUCOSE: 103 mg/dL (ref <117)

## 2015-02-06 LAB — VITAMIN D 25 HYDROXY (VIT D DEFICIENCY, FRACTURES): Vit D, 25-Hydroxy: 68 ng/mL (ref 30–100)

## 2015-02-06 LAB — MAGNESIUM: Magnesium: 2.2 mg/dL (ref 1.5–2.5)

## 2015-02-06 LAB — URIC ACID: Uric Acid, Serum: 3.6 mg/dL (ref 2.4–7.0)

## 2015-02-06 LAB — VITAMIN B12: Vitamin B-12: 982 pg/mL — ABNORMAL HIGH (ref 211–911)

## 2015-02-06 LAB — TSH: TSH: 1.808 u[IU]/mL (ref 0.350–4.500)

## 2015-02-08 LAB — TB SKIN TEST
INDURATION: 0 mm
TB SKIN TEST: NEGATIVE

## 2015-03-19 ENCOUNTER — Other Ambulatory Visit: Payer: Self-pay | Admitting: Physician Assistant

## 2015-03-31 ENCOUNTER — Encounter: Payer: Self-pay | Admitting: *Deleted

## 2015-04-08 ENCOUNTER — Other Ambulatory Visit: Payer: Self-pay | Admitting: Internal Medicine

## 2015-04-13 ENCOUNTER — Other Ambulatory Visit: Payer: Self-pay | Admitting: Internal Medicine

## 2015-05-10 ENCOUNTER — Ambulatory Visit: Payer: Self-pay | Admitting: Internal Medicine

## 2015-05-21 ENCOUNTER — Ambulatory Visit: Payer: Self-pay | Admitting: Internal Medicine

## 2015-05-22 ENCOUNTER — Ambulatory Visit: Payer: Self-pay | Admitting: Internal Medicine

## 2015-06-04 ENCOUNTER — Other Ambulatory Visit: Payer: Self-pay | Admitting: *Deleted

## 2015-06-04 DIAGNOSIS — K589 Irritable bowel syndrome without diarrhea: Secondary | ICD-10-CM

## 2015-06-04 MED ORDER — LUBIPROSTONE 8 MCG PO CAPS: ORAL_CAPSULE | ORAL | Status: DC

## 2015-06-14 ENCOUNTER — Other Ambulatory Visit: Payer: Self-pay | Admitting: Internal Medicine

## 2015-07-03 ENCOUNTER — Encounter (HOSPITAL_COMMUNITY): Payer: Self-pay | Admitting: *Deleted

## 2015-07-03 NOTE — Progress Notes (Signed)
Pt denies cardiac history, chest pain or sob.  Pt's PCP is Dr. Unk Pinto

## 2015-07-04 ENCOUNTER — Ambulatory Visit (HOSPITAL_COMMUNITY): Payer: 59 | Admitting: Certified Registered Nurse Anesthetist

## 2015-07-04 ENCOUNTER — Encounter (HOSPITAL_COMMUNITY): Payer: Self-pay | Admitting: *Deleted

## 2015-07-04 ENCOUNTER — Encounter (HOSPITAL_COMMUNITY)
Admission: RE | Disposition: A | Discharge status: Home / Self Care | Payer: Self-pay | Source: Ambulatory Visit | Attending: Orthopedic Surgery

## 2015-07-04 ENCOUNTER — Ambulatory Visit (HOSPITAL_COMMUNITY)
Admission: RE | Admit: 2015-07-04 | Discharge: 2015-07-05 | Disposition: A | Discharge status: Home / Self Care | Payer: 59 | Source: Ambulatory Visit | Attending: Orthopedic Surgery | Admitting: Orthopedic Surgery

## 2015-07-04 DIAGNOSIS — I10 Essential (primary) hypertension: Secondary | ICD-10-CM | POA: Insufficient documentation

## 2015-07-04 DIAGNOSIS — Z79899 Other long term (current) drug therapy: Secondary | ICD-10-CM | POA: Insufficient documentation

## 2015-07-04 DIAGNOSIS — G629 Polyneuropathy, unspecified: Secondary | ICD-10-CM | POA: Diagnosis not present

## 2015-07-04 DIAGNOSIS — E785 Hyperlipidemia, unspecified: Secondary | ICD-10-CM | POA: Insufficient documentation

## 2015-07-04 DIAGNOSIS — E559 Vitamin D deficiency, unspecified: Secondary | ICD-10-CM | POA: Insufficient documentation

## 2015-07-04 DIAGNOSIS — K589 Irritable bowel syndrome without diarrhea: Secondary | ICD-10-CM | POA: Diagnosis not present

## 2015-07-04 DIAGNOSIS — Z7982 Long term (current) use of aspirin: Secondary | ICD-10-CM | POA: Insufficient documentation

## 2015-07-04 DIAGNOSIS — W19XXXA Unspecified fall, initial encounter: Secondary | ICD-10-CM | POA: Diagnosis not present

## 2015-07-04 DIAGNOSIS — K219 Gastro-esophageal reflux disease without esophagitis: Secondary | ICD-10-CM | POA: Diagnosis not present

## 2015-07-04 DIAGNOSIS — S52571A Other intraarticular fracture of lower end of right radius, initial encounter for closed fracture: Secondary | ICD-10-CM | POA: Diagnosis present

## 2015-07-04 DIAGNOSIS — S52501A Unspecified fracture of the lower end of right radius, initial encounter for closed fracture: Secondary | ICD-10-CM | POA: Diagnosis present

## 2015-07-04 HISTORY — DX: Irritable bowel syndrome, unspecified: K58.9

## 2015-07-04 HISTORY — DX: Nausea with vomiting, unspecified: R11.2

## 2015-07-04 HISTORY — DX: Other specified postprocedural states: Z98.890

## 2015-07-04 HISTORY — DX: Other specified postprocedural states: R11.2

## 2015-07-04 HISTORY — PX: OPEN REDUCTION INTERNAL FIXATION (ORIF) DISTAL RADIAL FRACTURE: SHX5989

## 2015-07-04 LAB — BASIC METABOLIC PANEL
Anion gap: 13 (ref 5–15)
BUN: 22 mg/dL — AB (ref 6–20)
CHLORIDE: 106 mmol/L (ref 101–111)
CO2: 25 mmol/L (ref 22–32)
Calcium: 9.9 mg/dL (ref 8.9–10.3)
Creatinine, Ser: 1.15 mg/dL — ABNORMAL HIGH (ref 0.44–1.00)
GFR calc Af Amer: 59 mL/min — ABNORMAL LOW (ref >60)
GFR calc non Af Amer: 51 mL/min — ABNORMAL LOW (ref >60)
GLUCOSE: 91 mg/dL (ref 65–99)
POTASSIUM: 4 mmol/L (ref 3.5–5.1)
Sodium: 144 mmol/L (ref 135–145)

## 2015-07-04 LAB — CBC
HEMATOCRIT: 39.5 % (ref 36.0–46.0)
HEMOGLOBIN: 13.7 g/dL (ref 12.0–15.0)
MCH: 32.6 pg (ref 26.0–34.0)
MCHC: 34.7 g/dL (ref 30.0–36.0)
MCV: 94 fL (ref 78.0–100.0)
Platelets: 366 10*3/uL (ref 150–400)
RBC: 4.2 MIL/uL (ref 3.87–5.11)
RDW: 12.2 % (ref 11.5–15.5)
WBC: 10.1 10*3/uL (ref 4.0–10.5)

## 2015-07-04 SURGERY — OPEN REDUCTION INTERNAL FIXATION (ORIF) DISTAL RADIUS FRACTURE
Anesthesia: Regional | Site: Wrist | Laterality: Right

## 2015-07-04 MED ORDER — FENTANYL CITRATE (PF) 100 MCG/2ML IJ SOLN: 25.0000 ug | INTRAMUSCULAR | Status: DC | PRN

## 2015-07-04 MED ORDER — OXYCODONE HCL 5 MG/5ML PO SOLN: 5.0000 mg | Freq: Once | ORAL | Status: DC | PRN

## 2015-07-04 MED ORDER — LIDOCAINE HCL (CARDIAC) 20 MG/ML IV SOLN
INTRAVENOUS | Status: AC
  Filled 2015-07-04: qty 10

## 2015-07-04 MED ORDER — ASPIRIN EC 81 MG PO TBEC: 81.0000 mg | DELAYED_RELEASE_TABLET | ORAL | Status: DC

## 2015-07-04 MED ORDER — MEPIVACAINE HCL 1.5 % IJ SOLN
INTRAMUSCULAR | Status: DC | PRN
  Administered 2015-07-04: 10 mL via PERINEURAL

## 2015-07-04 MED ORDER — BUPIVACAINE HCL (PF) 0.25 % IJ SOLN
INTRAMUSCULAR | Status: DC | PRN
  Administered 2015-07-04: 10 mL

## 2015-07-04 MED ORDER — FENTANYL CITRATE (PF) 250 MCG/5ML IJ SOLN
INTRAMUSCULAR | Status: AC
  Filled 2015-07-04: qty 5

## 2015-07-04 MED ORDER — HYDROCODONE-ACETAMINOPHEN 5-325 MG PO TABS
1.0000 | ORAL_TABLET | Freq: Four times a day (QID) | ORAL | Status: DC | PRN
  Filled 2015-07-04: qty 1

## 2015-07-04 MED ORDER — OXYCODONE-ACETAMINOPHEN 5-325 MG PO TABS: 1.0000 | ORAL_TABLET | ORAL | Status: DC | PRN

## 2015-07-04 MED ORDER — CEFAZOLIN SODIUM-DEXTROSE 2-3 GM-% IV SOLR
INTRAVENOUS | Status: AC
  Administered 2015-07-04: 2 g via INTRAVENOUS
  Filled 2015-07-04: qty 50

## 2015-07-04 MED ORDER — DICYCLOMINE HCL 20 MG PO TABS: 20.0000 mg | ORAL_TABLET | Freq: Two times a day (BID) | ORAL | Status: DC

## 2015-07-04 MED ORDER — FEBUXOSTAT 40 MG PO TABS
40.0000 mg | ORAL_TABLET | Freq: Every day | ORAL | Status: DC
  Filled 2015-07-04 (×2): qty 1

## 2015-07-04 MED ORDER — OXYCODONE HCL 5 MG PO TABS: 5.0000 mg | ORAL_TABLET | Freq: Once | ORAL | Status: DC | PRN

## 2015-07-04 MED ORDER — MIDAZOLAM HCL 2 MG/2ML IJ SOLN
INTRAMUSCULAR | Status: AC
  Filled 2015-07-04: qty 2

## 2015-07-04 MED ORDER — VITAMIN C 500 MG PO TABS: 500.0000 mg | ORAL_TABLET | Freq: Every day | ORAL | Status: DC

## 2015-07-04 MED ORDER — ACETAMINOPHEN 325 MG PO TABS: 325.0000 mg | ORAL_TABLET | ORAL | Status: DC | PRN

## 2015-07-04 MED ORDER — DEXAMETHASONE SODIUM PHOSPHATE 4 MG/ML IJ SOLN
INTRAMUSCULAR | Status: DC | PRN
  Administered 2015-07-04: 4 mg via INTRAVENOUS

## 2015-07-04 MED ORDER — 0.9 % SODIUM CHLORIDE (POUR BTL) OPTIME
TOPICAL | Status: DC | PRN
  Administered 2015-07-04: 1000 mL

## 2015-07-04 MED ORDER — METHOCARBAMOL 500 MG PO TABS: 500.0000 mg | ORAL_TABLET | Freq: Four times a day (QID) | ORAL | Status: DC

## 2015-07-04 MED ORDER — MAGNESIUM 30 MG PO TABS: 30.0000 mg | ORAL_TABLET | Freq: Two times a day (BID) | ORAL | Status: DC

## 2015-07-04 MED ORDER — KCL IN DEXTROSE-NACL 20-5-0.45 MEQ/L-%-% IV SOLN
INTRAVENOUS | Status: AC
  Administered 2015-07-04: 1000 mL
  Filled 2015-07-04: qty 1000

## 2015-07-04 MED ORDER — KCL IN DEXTROSE-NACL 20-5-0.45 MEQ/L-%-% IV SOLN
INTRAVENOUS | Status: DC
  Administered 2015-07-04: 22:00:00 via INTRAVENOUS

## 2015-07-04 MED ORDER — PROPOFOL 10 MG/ML IV BOLUS
INTRAVENOUS | Status: DC | PRN
  Administered 2015-07-04: 140 mg via INTRAVENOUS

## 2015-07-04 MED ORDER — FENTANYL CITRATE (PF) 100 MCG/2ML IJ SOLN
INTRAMUSCULAR | Status: DC | PRN
  Administered 2015-07-04: 50 ug via INTRAVENOUS

## 2015-07-04 MED ORDER — ONDANSETRON HCL 4 MG/2ML IJ SOLN
INTRAMUSCULAR | Status: DC | PRN
  Administered 2015-07-04: 4 mg via INTRAVENOUS

## 2015-07-04 MED ORDER — ADULT MULTIVITAMIN W/MINERALS CH: 1.0000 | ORAL_TABLET | Freq: Every day | ORAL | Status: DC

## 2015-07-04 MED ORDER — DOCUSATE SODIUM 100 MG PO CAPS: 100.0000 mg | ORAL_CAPSULE | Freq: Two times a day (BID) | ORAL | Status: DC

## 2015-07-04 MED ORDER — CHLORHEXIDINE GLUCONATE 4 % EX LIQD
60.0000 mL | Freq: Once | CUTANEOUS | Status: DC
  Administered 2015-07-05: 4 via TOPICAL

## 2015-07-04 MED ORDER — BUPIVACAINE-EPINEPHRINE (PF) 0.5% -1:200000 IJ SOLN
INTRAMUSCULAR | Status: DC | PRN
  Administered 2015-07-04: 30 mL via PERINEURAL

## 2015-07-04 MED ORDER — CEFAZOLIN SODIUM-DEXTROSE 2-3 GM-% IV SOLR: 2.0000 g | INTRAVENOUS | Status: DC

## 2015-07-04 MED ORDER — ACETAMINOPHEN 160 MG/5ML PO SOLN: 325.0000 mg | ORAL | Status: DC | PRN

## 2015-07-04 MED ORDER — LIDOCAINE HCL (CARDIAC) 20 MG/ML IV SOLN
INTRAVENOUS | Status: DC | PRN
  Administered 2015-07-04: 60 mg via INTRAVENOUS

## 2015-07-04 MED ORDER — EPHEDRINE SULFATE 50 MG/ML IJ SOLN
INTRAMUSCULAR | Status: DC | PRN
  Administered 2015-07-04 (×2): 5 mg via INTRAVENOUS

## 2015-07-04 MED ORDER — MIDAZOLAM HCL 5 MG/5ML IJ SOLN
INTRAMUSCULAR | Status: DC | PRN
  Administered 2015-07-04: 2 mg via INTRAVENOUS

## 2015-07-04 MED ORDER — POTASSIUM CHLORIDE CRYS ER 20 MEQ PO TBCR: 20.0000 meq | EXTENDED_RELEASE_TABLET | Freq: Two times a day (BID) | ORAL | Status: DC

## 2015-07-04 MED ORDER — DEXAMETHASONE SODIUM PHOSPHATE 4 MG/ML IJ SOLN
INTRAMUSCULAR | Status: AC
  Filled 2015-07-04: qty 1

## 2015-07-04 MED ORDER — LACTATED RINGERS IV SOLN
INTRAVENOUS | Status: DC
  Administered 2015-07-04 (×2): via INTRAVENOUS

## 2015-07-04 MED ORDER — ONDANSETRON HCL 4 MG/2ML IJ SOLN
INTRAMUSCULAR | Status: AC
  Filled 2015-07-04: qty 2

## 2015-07-04 SURGICAL SUPPLY — 63 items
BANDAGE ACE 3X5.8 VEL STRL LF (GAUZE/BANDAGES/DRESSINGS) ×2 IMPLANT
BANDAGE ACE 4X5 VEL STRL LF (GAUZE/BANDAGES/DRESSINGS) ×2 IMPLANT
BANDAGE ELASTIC 3 VELCRO ST LF (GAUZE/BANDAGES/DRESSINGS) ×2 IMPLANT
BANDAGE ELASTIC 4 VELCRO ST LF (GAUZE/BANDAGES/DRESSINGS) ×2 IMPLANT
BIT DRILL 2.2 SS TIBIAL (BIT) ×2 IMPLANT
BLADE SURG ROTATE 9660 (MISCELLANEOUS) IMPLANT
BNDG ESMARK 4X9 LF (GAUZE/BANDAGES/DRESSINGS) ×2 IMPLANT
BNDG GAUZE ELAST 4 BULKY (GAUZE/BANDAGES/DRESSINGS) ×2 IMPLANT
CANISTER SUCTION 2500CC (MISCELLANEOUS) ×2 IMPLANT
CORDS BIPOLAR (ELECTRODE) ×2 IMPLANT
COVER SURGICAL LIGHT HANDLE (MISCELLANEOUS) ×2 IMPLANT
CUFF TOURNIQUET SINGLE 18IN (TOURNIQUET CUFF) ×2 IMPLANT
CUFF TOURNIQUET SINGLE 24IN (TOURNIQUET CUFF) IMPLANT
DECANTER SPIKE VIAL GLASS SM (MISCELLANEOUS) ×4 IMPLANT
DRAPE OEC MINIVIEW 54X84 (DRAPES) ×2 IMPLANT
DRAPE SURG 17X11 SM STRL (DRAPES) ×2 IMPLANT
DRSG ADAPTIC 3X8 NADH LF (GAUZE/BANDAGES/DRESSINGS) ×2 IMPLANT
GAUZE SPONGE 4X4 12PLY STRL (GAUZE/BANDAGES/DRESSINGS) ×2 IMPLANT
GAUZE SPONGE 4X4 16PLY XRAY LF (GAUZE/BANDAGES/DRESSINGS) IMPLANT
GLOVE BIOGEL PI IND STRL 8.5 (GLOVE) ×1 IMPLANT
GLOVE BIOGEL PI INDICATOR 8.5 (GLOVE) ×1
GLOVE SURG ORTHO 8.0 STRL STRW (GLOVE) ×2 IMPLANT
GOWN STRL REUS W/ TWL LRG LVL3 (GOWN DISPOSABLE) ×2 IMPLANT
GOWN STRL REUS W/ TWL XL LVL3 (GOWN DISPOSABLE) ×2 IMPLANT
GOWN STRL REUS W/TWL LRG LVL3 (GOWN DISPOSABLE) ×2
GOWN STRL REUS W/TWL XL LVL3 (GOWN DISPOSABLE) ×2
K-WIRE 1.6 (WIRE) ×1
K-WIRE FX5X1.6XNS BN SS (WIRE) ×1
KIT BASIN OR (CUSTOM PROCEDURE TRAY) ×2 IMPLANT
KIT ROOM TURNOVER OR (KITS) ×2 IMPLANT
KWIRE FX5X1.6XNS BN SS (WIRE) ×1 IMPLANT
NEEDLE HYPO 25X1 1.5 SAFETY (NEEDLE) ×2 IMPLANT
NS IRRIG 1000ML POUR BTL (IV SOLUTION) ×2 IMPLANT
PACK ORTHO EXTREMITY (CUSTOM PROCEDURE TRAY) ×2 IMPLANT
PAD ARMBOARD 7.5X6 YLW CONV (MISCELLANEOUS) ×4 IMPLANT
PAD CAST 4YDX4 CTTN HI CHSV (CAST SUPPLIES) ×1 IMPLANT
PADDING CAST COTTON 4X4 STRL (CAST SUPPLIES) ×1
PEG LOCKING SMOOTH 2.2X16 (Screw) ×2 IMPLANT
PEG LOCKING SMOOTH 2.2X18 (Peg) ×2 IMPLANT
PEG LOCKING SMOOTH 2.2X20 (Screw) ×6 IMPLANT
PEG LOCKING SMOOTH 2.2X22 (Screw) ×4 IMPLANT
PLATE DVR CROSSLOCK STD RT (Plate) ×2 IMPLANT
SCREW LOCK 14X2.7X 3 LD TPR (Screw) ×3 IMPLANT
SCREW LOCK 16X2.7X 3 LD TPR (Screw) ×2 IMPLANT
SCREW LOCKING 2.7X14 (Screw) ×3 IMPLANT
SCREW LOCKING 2.7X16 (Screw) ×2 IMPLANT
SCREW NLOCK 24X2.7 3 LD (Screw) ×1 IMPLANT
SCREW NONLOCK 2.7X24 (Screw) ×1 IMPLANT
SOAP 2 % CHG 4 OZ (WOUND CARE) ×2 IMPLANT
SPLINT FIBERGLASS 3X12 (CAST SUPPLIES) ×2 IMPLANT
SPLINT FIBERGLASS 3X35 (CAST SUPPLIES) ×2 IMPLANT
SPONGE LAP 4X18 X RAY DECT (DISPOSABLE) IMPLANT
SUCTION FRAZIER TIP 10 FR DISP (SUCTIONS) ×2 IMPLANT
SUT ETHILON 4 0 PS 2 18 (SUTURE) IMPLANT
SUT MNCRL AB 4-0 PS2 18 (SUTURE) IMPLANT
SUT PROLENE 4 0 PS 2 18 (SUTURE) ×2 IMPLANT
SUT VIC AB 2-0 FS1 27 (SUTURE) ×2 IMPLANT
SUT VICRYL 4-0 PS2 18IN ABS (SUTURE) ×2 IMPLANT
SYR CONTROL 10ML LL (SYRINGE) ×2 IMPLANT
TOWEL OR 17X24 6PK STRL BLUE (TOWEL DISPOSABLE) ×2 IMPLANT
TOWEL OR 17X26 10 PK STRL BLUE (TOWEL DISPOSABLE) ×2 IMPLANT
TUBE CONNECTING 12X1/4 (SUCTIONS) ×2 IMPLANT
YANKAUER SUCT BULB TIP NO VENT (SUCTIONS) ×2 IMPLANT

## 2015-07-04 NOTE — Transfer of Care (Signed)
Immediate Anesthesia Transfer of Care Note  Patient: Katie Cox  Procedure(s) Performed: Procedure(s): OPEN REDUCTION INTERNAL FIXATION (ORIF) RIGHT DISTAL RADIUS FRACTURE (Right)  Patient Location: PACU  Anesthesia Type:General  Level of Consciousness: awake, alert , oriented and patient cooperative  Airway & Oxygen Therapy: Patient Spontanous Breathing and Patient connected to nasal cannula oxygen  Post-op Assessment: Report given to RN and Post -op Vital signs reviewed and stable  Post vital signs: Reviewed and stable  Last Vitals:  Filed Vitals:   07/04/15 1643  BP: 136/71  Pulse: 71  Temp: 37.1 C  Resp: 18    Complications: No apparent anesthesia complications

## 2015-07-04 NOTE — Anesthesia Preprocedure Evaluation (Addendum)
Anesthesia Evaluation  Patient identified by MRN, date of birth, ID band Patient awake    Reviewed: Allergy & Precautions, NPO status , Patient's Chart, lab work & pertinent test results  History of Anesthesia Complications (+) PONV and history of anesthetic complications  Airway Mallampati: II  TM Distance: >3 FB Neck ROM: Full    Dental  (+) Teeth Intact   Pulmonary neg pulmonary ROS,    breath sounds clear to auscultation       Cardiovascular hypertension, Pt. on medications (-) angina(-) Past MI and (-) CHF  Rhythm:Regular     Neuro/Psych neg Seizures  Neuromuscular disease negative psych ROS   GI/Hepatic Neg liver ROS, GERD  Medicated and Controlled,  Endo/Other  negative endocrine ROS  Renal/GU negative Renal ROS     Musculoskeletal   Abdominal   Peds  Hematology negative hematology ROS (+)   Anesthesia Other Findings   Reproductive/Obstetrics                            Anesthesia Physical Anesthesia Plan  ASA: II  Anesthesia Plan: General and Regional   Post-op Pain Management: MAC Combined w/ Regional for Post-op pain   Induction: Intravenous  Airway Management Planned: LMA  Additional Equipment: None  Intra-op Plan:   Post-operative Plan: Extubation in OR  Informed Consent: I have reviewed the patients History and Physical, chart, labs and discussed the procedure including the risks, benefits and alternatives for the proposed anesthesia with the patient or authorized representative who has indicated his/her understanding and acceptance.   Dental advisory given  Plan Discussed with: CRNA and Surgeon  Anesthesia Plan Comments:         Anesthesia Quick Evaluation

## 2015-07-04 NOTE — Op Note (Signed)
NAME:  [name], [name]                ACCOUNT NO.:  [id number]  MEDICAL RECORD NO.:  [id number]  LOCATION:  [facility]                         FACILITY:  [facility]  PHYSICIAN:  Katie Cox IV, M.D.DATE OF BIRTH:  02/12/1956  DATE OF PROCEDURE:  07/04/2015 DATE OF DISCHARGE:                              OPERATIVE REPORT   PREOPERATIVE DIAGNOSIS:  Right wrist intra-articular distal radius fracture, 2 or more fragments.  POSTOPERATIVE DIAGNOSIS:  Right wrist intra-articular distal radius fracture, 2 or more fragments.  SURGEON:  Katie Cox, M.D., who scrubbed and present for the entire procedure.  ASSISTANT SURGEON:  None.  ANESTHESIA:  Axillary block with general anesthesia via LMA.  PROCEDURE: 1. Open treatment of right wrist intra-articular distal radius     fracture, 2 or more fragments. 2. Right wrist brachioradialis tendon release and tendon tenotomy. 3. Radiographs 3 views, right wrist.  SURGICAL IMPLANTS:  DVR Crosslock standard plate.  RADIOGRAPHIC INTERPRETATION:  AP and lateral views of the wrist did show the volar plate fixation in good position.  Good restoration of the radial height, inclination, and tilt.  SURGICAL INDICATIONS:  Katie Cox is a 60 year old right-hand dominant female, who sustained a closed intra-articular distal radius fracture. The patient was seen and evaluated in the office and recommended to undergo the above procedure.  Risks, benefits, and alternatives were discussed in detail with the patient.  Signed informed consent was obtained.  Risks included, but not limited to bleeding; infection; damage to nearby nerves, arteries, or tendons; loss of motion of wrist and digits; incomplete relief of symptoms; need for further surgical intervention.  DESCRIPTION OF PROCEDURE:  The patient was properly identified in the preoperative holding area, marked with a permanent marker on the right wrist to indicate the correct operative site.  The patient  was brought back to the operating room, placed supine on the anesthesia table where general anesthesia was administered.  The patient tolerated this well. A well-padded tourniquet placed on the right brachium and sealed with 1000 drape.  Right upper extremity was then prepped and draped in normal sterile fashion.  Time-out was called, correct site was identified, and procedure then begun.  Attention turned to the right wrist.  Limb was then elevated using Esmarch exsanguination and tourniquet inflated. Dissection was carried down through the skin and subcutaneous tissue. The FCR sheath was opened proximally, distally going through the FCR sheath, the FPL was then swept out of the way.  An L-shaped pronator quadratus flap was then elevated.  Following this, the brachioradialis was then carefully tenotomized and released off the radial styloid. Careful dissection of first dorsal compartment tendons was done.  After brachial tendon release and lengthening, fracture site was then exposed. There was an intra-articular fracture just about 2 or more fragments. Wound was then irrigated.  The open reduction was then performed.  The volar plate was then applied.  The plate height was then confirmed using mini C-arm.  It was then fixed distally with the distal locking pegs. The oblong screw hole was then placed proximally.  Following this, 4 more cortical shaft screws were then placed proximally.  The wound was then irrigated.  Final radiographs were  then obtained.  The pronator quadratus was closed with 2-0 Vicryl.  Subcutaneous tissues closed with 4-0 Vicryl.  Skin was closed with 4-0 Prolene sutures.  Adaptic dressing, sterile compressive bandage then applied.  The patient was placed in well-padded sugar-tong splint, extubated, and taken to recovery room in good condition.  POSTOPERATIVE PLAN:  The patient discharged home, seen back in the office in approximately 2 weeks for wound check, x-rays,  sutures out, application of short-arm cast, put in order for therapy for evaluation and treatment at the 4-week mark.  See her back in the 4-week mark, x- rays, out of the cast, and begin an outpatient therapy regimen. Radiographs at each visit.     Melrose Nakayama, M.D.     FWO/MEDQ  D:  07/04/2015  T:  07/04/2015  Job:  [id number]

## 2015-07-04 NOTE — Brief Op Note (Signed)
07/04/2015  7:15 PM  PATIENT:  Katie Cox  60 y.o. female  PRE-OPERATIVE DIAGNOSIS:  RIGHT DISTAL RADIUS FRACTURE   POST-OPERATIVE DIAGNOSIS:  * No post-op diagnosis entered *  PROCEDURE:  Procedure(s): OPEN REDUCTION INTERNAL FIXATION (ORIF) RIGHT DISTAL RADIUS FRACTURE (Right)  SURGEON:  Surgeon(s) and Role:    * Iran Planas, MD - Primary  PHYSICIAN ASSISTANT:   ASSISTANTS: none   ANESTHESIA:   general  EBL:     BLOOD ADMINISTERED:none  DRAINS: none   LOCAL MEDICATIONS USED:  NONE  SPECIMEN:  No Specimen  DISPOSITION OF SPECIMEN:  N/A  COUNTS:  YES  TOURNIQUET:    DICTATION: .[id number]  PLAN OF CARE: Admit for overnight observation  PATIENT DISPOSITION:  PACU - hemodynamically stable.   Delay start of Pharmacological VTE agent (>24hrs) due to surgical blood loss or risk of bleeding: not applicable

## 2015-07-04 NOTE — Progress Notes (Signed)
Orthopedic Tech Progress Note Patient Details:  Katie Cox 18-Mar-1956 [id number]  Ortho Devices Type of Ortho Device: Arm sling Ortho Device/Splint Location: RUE Ortho Device/Splint Interventions: Ordered, Application   Braulio Bosch 07/04/2015, 10:40 PM

## 2015-07-04 NOTE — Progress Notes (Signed)
Sign out request made to dr Oletta Lamas

## 2015-07-04 NOTE — Anesthesia Procedure Notes (Addendum)
Anesthesia Regional Block:  Axillary brachial plexus block  Pre-Anesthetic Checklist: ,, timeout performed, Correct Patient, Correct Site, Correct Laterality, Correct Procedure, Correct Position, site marked, Risks and benefits discussed,  Surgical consent,  Pre-op evaluation,  At surgeon's request and post-op pain management  Laterality: Upper and Right  Prep: chloraprep       Needles:  Injection technique: Single-shot  Needle Type: Echogenic Needle          Additional Needles:  Procedures: ultrasound guided (picture in chart) Axillary brachial plexus block Narrative:  Injection made incrementally with aspirations every 5 mL.  Performed by: Personally   Additional Notes: H+P and labs reviewed, risks and benefits discussed with patient, procedure tolerated well without complications   Procedure Name: LMA Insertion Date/Time: 07/04/2015 8:22 PM Performed by: Hollie Salk Z Pre-anesthesia Checklist: Patient identified, Timeout performed, Emergency Drugs available, Suction available and Patient being monitored Patient Re-evaluated:Patient Re-evaluated prior to inductionOxygen Delivery Method: Circle system utilized Preoxygenation: Pre-oxygenation with 100% oxygen Intubation Type: IV induction Ventilation: Mask ventilation without difficulty LMA: LMA inserted LMA Size: 4.0 Tube type: Oral Number of attempts: 1 Placement Confirmation: breath sounds checked- equal and bilateral and positive ETCO2 Tube secured with: Tape Dental Injury: Teeth and Oropharynx as per pre-operative assessment     Anesthesia Regional Block:   Narrative:

## 2015-07-04 NOTE — H&P (Signed)
Katie Cox is an 60 y.o. female.   Chief Complaint: RIGHT DISTAL RADIUS FRACTURE AFTER FALL HPI: PT FELL AND LANDED ON OUTSTRETCHED RIGHT WRIST PT SEEN/EVALUATED IN OFFICE YESTERDAY PT WITH DISPLACED RIGHT DISTAL RADIUS FRACTURE NO PRIOR SURGERY RIGHT WRIST  Past Medical History  Diagnosis Date  . Contact lens/glasses fitting     wears contacts or glasses  . GERD (gastroesophageal reflux disease)   . Hyperlipidemia   . Hypertension   . Vitamin D deficiency   . Peripheral neuropathy (HCC)     in feet  . Irritable bowel syndrome (IBS)   . PONV (postoperative nausea and vomiting)     nausea, slow to wake up at times    Past Surgical History  Procedure Laterality Date  . Goiter removed fro thyroid    . Foot surgery      both feet - little toes on each foot  . Colonoscopy    . Cholecystectomy  04/14/2012    Procedure: LAPAROSCOPIC CHOLECYSTECTOMY;  Surgeon: Rolm Bookbinder, MD;  Location: [facility];  Service: General;  Laterality: N/A;    Family History  Problem Relation Age of Onset  . Breast cancer Maternal Aunt   . Colon cancer Father   . Heart disease Mother    Social History:  reports that she has never smoked. She has never used smokeless tobacco. She reports that she does not drink alcohol or use illicit drugs.  Allergies:  Allergies  Allergen Reactions  . Alprazolam   . Prednisone     Only high doses  . Allopurinol Rash    Medications Prior to Admission  Medication Sig Dispense Refill  . aspirin 81 MG tablet Take 81 mg by mouth every other day.     . bumetanide (BUMEX) 2 MG tablet TAKE 1 TABLET TWICE A DAY FOR BLOOD PRESSURE AND FLUID 180 tablet 1  . Cholecalciferol (VITAMIN D PO) Take 2,000 Units by mouth daily.     Marland Kitchen dicyclomine (BENTYL) 20 MG tablet Take 20 mg by mouth 2 (two) times daily.    Marland Kitchen HYDROcodone-acetaminophen (NORCO/VICODIN) 5-325 MG tablet Take 1 tablet by mouth every 6 (six) hours as needed for moderate pain.    .  magnesium 30 MG tablet Take 30 mg by mouth 2 (two) times daily.    . Multiple Vitamin (MULTIVITAMIN) tablet Take 1 tablet by mouth daily.    . potassium chloride SA (K-DUR,KLOR-CON) 20 MEQ tablet TAKE 1 TABLET TWICE A DAY 180 tablet 1  . ranitidine (ZANTAC) 300 MG tablet TAKE 1 TABLET DAILY FOR ACID REFLUX 90 tablet 4  . ULORIC 40 MG tablet TAKE 1 TABLET DAILY 90 tablet 2    Results for orders placed or performed during the hospital encounter of 07/04/15 (from the past 48 hour(s))  CBC     Status: None   Collection Time: 07/04/15  4:55 PM  Result Value Ref Range   WBC 10.1 4.0 - 10.5 K/uL   RBC 4.20 3.87 - 5.11 MIL/uL   Hemoglobin 13.7 12.0 - 15.0 g/dL   HCT 39.5 36.0 - 46.0 %   MCV 94.0 78.0 - 100.0 fL   MCH 32.6 26.0 - 34.0 pg   MCHC 34.7 30.0 - 36.0 g/dL   RDW 12.2 11.5 - 15.5 %   Platelets 366 150 - 400 K/uL  Basic metabolic panel     Status: Abnormal   Collection Time: 07/04/15  4:55 PM  Result Value Ref Range   Sodium 144 135 -  145 mmol/L   Potassium 4.0 3.5 - 5.1 mmol/L   Chloride 106 101 - 111 mmol/L   CO2 25 22 - 32 mmol/L   Glucose, Bld 91 65 - 99 mg/dL   BUN 22 (H) 6 - 20 mg/dL   Creatinine, Ser 1.15 (H) 0.44 - 1.00 mg/dL   Calcium 9.9 8.9 - 10.3 mg/dL   GFR calc non Af Amer 51 (L) >60 mL/min   GFR calc Af Amer 59 (L) >60 mL/min    Comment: (NOTE) The eGFR has been calculated using the CKD EPI equation. This calculation has not been validated in all clinical situations. eGFR's persistently <60 mL/min signify possible Chronic Kidney Disease.    Anion gap 13 5 - 15   No results found.  ROS NO RECENT ILLNESSES OR HOSPITALIZATIONS  Blood pressure 136/71, pulse 71, temperature 98.7 F (37.1 C), temperature source Oral, resp. rate 18, height _0  (1.727 m), weight 68.04 kg (150 lb), SpO2 100 %. Physical Exam  General Appearance:  Alert, cooperative, no distress, appears stated age  Head:  Normocephalic, without obvious abnormality, atraumatic  Eyes:  Pupils  equal, conjunctiva/corneas clear,         Throat: Lips, mucosa, and tongue normal; teeth and gums normal  Neck: No visible masses     Lungs:   respirations unlabored  Chest Wall:  No tenderness or deformity  Heart:  Regular rate and rhythm,  Abdomen:   Soft, non-tender,         Extremities: RIGHT WRIST: SKIN INTACT, FINGERS WARM WELL PERFUSED ABLE TO FLEX AND EXTEND WRIST BUT LIMITED GOOD FOREARM ROTATION ABLE TO EXTEND THUMB AND GOOD FINGER EXTENSION  Pulses: 2+ and symmetric  Skin: Skin color, texture, turgor normal, no rashes or lesions     Neurologic: Normal    Assessment/Plan RIGHT DISTAL RADIUS FRACTURE, DISPLACED  RIGHT DISTAL RADIUS OPEN REDUCTION AND INTERNAL FIXATION AND REPAIR AS INDICATED  R/B/A DISCUSSED WITH PT IN OFFICE.  PT VOICED UNDERSTANDING OF PLAN CONSENT SIGNED DAY OF SURGERY PT SEEN AND EXAMINED PRIOR TO OPERATIVE PROCEDURE/DAY OF SURGERY SITE MARKED. QUESTIONS ANSWERED WILL GO HOME FOLLOWING SURGERY  WE ARE PLANNING SURGERY FOR YOUR UPPER EXTREMITY. THE RISKS AND BENEFITS OF SURGERY INCLUDE BUT NOT LIMITED TO BLEEDING INFECTION, DAMAGE TO NEARBY NERVES ARTERIES TENDONS, FAILURE OF SURGERY TO ACCOMPLISH ITS INTENDED GOALS, PERSISTENT SYMPTOMS AND NEED FOR FURTHER SURGICAL INTERVENTION. WITH THIS IN MIND WE WILL PROCEED. I HAVE DISCUSSED WITH THE PATIENT THE PRE AND POSTOPERATIVE REGIMEN AND THE DOS AND DON'TS. PT VOICED UNDERSTANDING AND INFORMED CONSENT SIGNED.  Linna Hoff 07/04/2015, 7:12 PM

## 2015-07-05 ENCOUNTER — Encounter (HOSPITAL_COMMUNITY): Payer: Self-pay | Admitting: Orthopedic Surgery

## 2015-07-05 DIAGNOSIS — S52571A Other intraarticular fracture of lower end of right radius, initial encounter for closed fracture: Secondary | ICD-10-CM | POA: Diagnosis not present

## 2015-07-05 MED ORDER — CEFAZOLIN SODIUM 1-5 GM-% IV SOLN: 1.0000 g | INTRAVENOUS | Status: DC

## 2015-07-05 MED ORDER — HYDROMORPHONE HCL 1 MG/ML IJ SOLN: 0.5000 mg | INTRAMUSCULAR | Status: DC | PRN

## 2015-07-05 MED ORDER — VITAMIN C 500 MG PO TABS: 1000.0000 mg | ORAL_TABLET | Freq: Every day | ORAL | Status: DC

## 2015-07-05 MED ORDER — HYDROCODONE-ACETAMINOPHEN 5-325 MG PO TABS: 1.0000 | ORAL_TABLET | ORAL | Status: DC | PRN

## 2015-07-05 MED ORDER — ONDANSETRON HCL 4 MG PO TABS: 4.0000 mg | ORAL_TABLET | Freq: Four times a day (QID) | ORAL | Status: DC | PRN

## 2015-07-05 MED ORDER — METHOCARBAMOL 1000 MG/10ML IJ SOLN: 500.0000 mg | Freq: Four times a day (QID) | INTRAVENOUS | Status: DC | PRN

## 2015-07-05 MED ORDER — SENNOSIDES-DOCUSATE SODIUM 8.6-50 MG PO TABS: 1.0000 | ORAL_TABLET | Freq: Every evening | ORAL | Status: DC | PRN

## 2015-07-05 MED ORDER — CEFAZOLIN SODIUM 1-5 GM-% IV SOLN
1.0000 g | Freq: Three times a day (TID) | INTRAVENOUS | Status: DC
  Administered 2015-07-05: 1 g via INTRAVENOUS
  Filled 2015-07-05 (×3): qty 50

## 2015-07-05 MED ORDER — ONDANSETRON HCL 4 MG/2ML IJ SOLN: 4.0000 mg | Freq: Four times a day (QID) | INTRAMUSCULAR | Status: DC | PRN

## 2015-07-05 MED ORDER — METHOCARBAMOL 500 MG PO TABS
500.0000 mg | ORAL_TABLET | Freq: Four times a day (QID) | ORAL | Status: DC | PRN
  Administered 2015-07-05: 500 mg via ORAL
  Filled 2015-07-05: qty 1

## 2015-07-05 MED ORDER — ADULT MULTIVITAMIN W/MINERALS CH: 1.0000 | ORAL_TABLET | Freq: Every day | ORAL | Status: DC

## 2015-07-05 MED ORDER — OXYCODONE-ACETAMINOPHEN 5-325 MG PO TABS
1.0000 | ORAL_TABLET | ORAL | Status: DC | PRN
  Administered 2015-07-05 (×2): 1 via ORAL
  Filled 2015-07-05 (×2): qty 1

## 2015-07-05 MED ORDER — DIPHENHYDRAMINE HCL 25 MG PO CAPS: 25.0000 mg | ORAL_CAPSULE | Freq: Four times a day (QID) | ORAL | Status: DC | PRN

## 2015-07-05 NOTE — Progress Notes (Signed)
Received patient from PACU, alert and oriented x 4, no c/o pain, dressing is dry and intact. Right arm elevated on pillows as order. Patient oriented to unit, call bell within reach. Will cont to monitor.

## 2015-07-05 NOTE — Progress Notes (Signed)
   07/05/15 1114  Mobility  Activity Ambulate in hall;Ambulate in room  Level of Assistance Independent  Distance Ambulated (ft) 100 ft  Range of Motion Active;All extremities   Patient ambulates in hall with standby assist. No complaints voice at this time. Denied dizziness. Will continue to monitor.   Ave Filter, RN

## 2015-07-05 NOTE — Discharge Summary (Signed)
Physician Discharge Summary  Patient ID: Katie Cox MRN: [id number] DOB/AGE: Sep 12, 1955 60 y.o.  Admit date: 07/04/2015 Discharge date: 07/05/2015  Admission Diagnoses: RIGHT DISTAL RADIUS FRACTURE  Past Medical History  Diagnosis Date  . Contact lens/glasses fitting     wears contacts or glasses  . GERD (gastroesophageal reflux disease)   . Hyperlipidemia   . Hypertension   . Vitamin D deficiency   . Peripheral neuropathy (HCC)     in feet  . Irritable bowel syndrome (IBS)   . PONV (postoperative nausea and vomiting)     nausea, slow to wake up at times    Discharge Diagnoses:  Active Problems:   Fracture of right distal radius   Surgeries: Procedure(s): OPEN REDUCTION INTERNAL FIXATION (ORIF) RIGHT DISTAL RADIUS FRACTURE on 07/04/2015    Consultants:    Discharged Condition: Improved  Hospital Course: Katie Cox is an 60 y.o. female who was admitted 07/04/2015 with a chief complaint of No chief complaint on file. , and found to have a diagnosis of RIGHT DISTAL RADIUS FRACTURE .  They were brought to the operating room on 07/04/2015 and underwent Procedure(s): OPEN REDUCTION INTERNAL FIXATION (ORIF) RIGHT DISTAL RADIUS FRACTURE.    They were given perioperative antibiotics: Anti-infectives    Start     Dose/Rate Route Frequency Ordered Stop   07/05/15 0600  ceFAZolin (ANCEF) IVPB 2 g/50 mL premix  Status:  Discontinued     2 g 100 mL/hr over 30 Minutes Intravenous On call to O.R. 07/04/15 1629 07/04/15 2233   07/05/15 0200  ceFAZolin (ANCEF) IVPB 1 g/50 mL premix     1 g 100 mL/hr over 30 Minutes Intravenous Every 8 hours 07/05/15 0033     07/05/15 0045  ceFAZolin (ANCEF) IVPB 1 g/50 mL premix  Status:  Discontinued     1 g 100 mL/hr over 30 Minutes Intravenous NOW 07/05/15 0033 07/05/15 0034   07/04/15 1015  ceFAZolin (ANCEF) 2-3 GM-% IVPB SOLR    Comments:  Maryjean Ka   : cabinet override      07/04/15 1015 07/04/15 2020    .  They were given  sequential compression devices, early ambulation, and Other (comment) for DVT prophylaxis.  Recent vital signs: Patient Vitals for the past 24 hrs:  BP Temp Temp src Pulse Resp SpO2 Height Weight  07/05/15 1002 (!) 109/56 mmHg 98.6 F (37 C) Oral 61 16 100 % - -  07/05/15 0545 (!) 103/52 mmHg 97.6 F (36.4 C) Oral (!) 55 14 99 % - -  07/05/15 0220 (!) 103/53 mmHg 97.7 F (36.5 C) Oral (!) 56 14 96 % - -  07/04/15 2310 115/90 mmHg 97.9 F (36.6 C) Oral 67 14 99 % 5\' 8"  (1.727 m) 70.2 kg (154 lb 12.2 oz)  07/04/15 2200 128/74 mmHg 98 F (36.7 C) - 70 12 99 % - -  07/04/15 2145 134/73 mmHg - - 73 12 98 % - -  07/04/15 2130 138/76 mmHg - - 86 20 100 % - -  07/04/15 2123 (!) 144/76 mmHg 98.3 F (36.8 C) - 96 20 99 % - -  07/04/15 1643 136/71 mmHg 98.7 F (37.1 C) Oral 71 18 100 % 5\' 8"  (1.727 m) 68.04 kg (150 lb)  .  Recent laboratory studies: No results found.  Discharge Medications:     Medication List    TAKE these medications        aspirin 81 MG tablet  Take 81 mg by mouth  every other day.     bumetanide 2 MG tablet  Commonly known as:  BUMEX  TAKE 1 TABLET TWICE A DAY FOR BLOOD PRESSURE AND FLUID     dicyclomine 20 MG tablet  Commonly known as:  BENTYL  Take 20 mg by mouth 2 (two) times daily.     docusate sodium 100 MG capsule  Commonly known as:  COLACE  Take 1 capsule (100 mg total) by mouth 2 (two) times daily.     HYDROcodone-acetaminophen 5-325 MG tablet  Commonly known as:  NORCO/VICODIN  Take 1 tablet by mouth every 6 (six) hours as needed for moderate pain.     magnesium 30 MG tablet  Take 30 mg by mouth 2 (two) times daily.     methocarbamol 500 MG tablet  Commonly known as:  ROBAXIN  Take 1 tablet (500 mg total) by mouth 4 (four) times daily.     multivitamin tablet  Take 1 tablet by mouth daily.     oxyCODONE-acetaminophen 5-325 MG tablet  Commonly known as:  ROXICET  Take 1 tablet by mouth every 4 (four) hours as needed for severe pain.      potassium chloride SA 20 MEQ tablet  Commonly known as:  K-DUR,KLOR-CON  TAKE 1 TABLET TWICE A DAY     ranitidine 300 MG tablet  Commonly known as:  ZANTAC  TAKE 1 TABLET DAILY FOR ACID REFLUX     ULORIC 40 MG tablet  Generic drug:  febuxostat  TAKE 1 TABLET DAILY     vitamin C 500 MG tablet  Commonly known as:  ASCORBIC ACID  Take 1 tablet (500 mg total) by mouth daily.     VITAMIN D PO  Take 2,000 Units by mouth daily.        Diagnostic Studies: No results found.  They benefited maximally from their hospital stay and there were no complications.     Disposition: 01-Home or Self Care     Signed: Linna Hoff 07/05/2015, 10:16 AM

## 2015-07-05 NOTE — Anesthesia Postprocedure Evaluation (Signed)
Anesthesia Post Note  Patient: Katie Cox  Procedure(s) Performed: Procedure(s) (LRB): OPEN REDUCTION INTERNAL FIXATION (ORIF) RIGHT DISTAL RADIUS FRACTURE (Right)  Patient location during evaluation: PACU Anesthesia Type: General Level of consciousness: awake Pain management: pain level controlled Vital Signs Assessment: post-procedure vital signs reviewed and stable Respiratory status: spontaneous breathing Cardiovascular status: stable Anesthetic complications: no    Last Vitals:  Filed Vitals:   07/04/15 2200 07/04/15 2310  BP: 128/74 115/90  Pulse: 70 67  Temp: 36.7 C 36.6 C  Resp: 12 14    Last Pain:  Filed Vitals:   07/04/15 2312  PainSc: 10-Worst pain ever                 EDWARDS,Lamaria Hildebrandt

## 2015-07-05 NOTE — Progress Notes (Addendum)
Discharge instructions thoroughly reviewed with patient. All questions answered at this time. Patient prefer to take her daily medications once return to home. RX given with side effects reviewed.  Awaiting for transport from family.   Ave Filter, RN

## 2015-08-14 ENCOUNTER — Ambulatory Visit: Payer: Self-pay | Admitting: Internal Medicine

## 2015-08-31 ENCOUNTER — Other Ambulatory Visit: Payer: Self-pay

## 2015-08-31 DIAGNOSIS — Z1231 Encounter for screening mammogram for malignant neoplasm of breast: Secondary | ICD-10-CM

## 2015-09-06 ENCOUNTER — Ambulatory Visit (INDEPENDENT_AMBULATORY_CARE_PROVIDER_SITE_OTHER): Payer: 59 | Admitting: Internal Medicine

## 2015-09-06 ENCOUNTER — Encounter: Payer: Self-pay | Admitting: Internal Medicine

## 2015-09-06 VITALS — BP 114/80 | HR 64 | Temp 97.5°F | Resp 16 | Ht 67.0 in | Wt 150.8 lb

## 2015-09-06 DIAGNOSIS — Z79899 Other long term (current) drug therapy: Secondary | ICD-10-CM | POA: Diagnosis not present

## 2015-09-06 DIAGNOSIS — E559 Vitamin D deficiency, unspecified: Secondary | ICD-10-CM | POA: Diagnosis not present

## 2015-09-06 DIAGNOSIS — K219 Gastro-esophageal reflux disease without esophagitis: Secondary | ICD-10-CM

## 2015-09-06 DIAGNOSIS — E790 Hyperuricemia without signs of inflammatory arthritis and tophaceous disease: Secondary | ICD-10-CM | POA: Diagnosis not present

## 2015-09-06 DIAGNOSIS — E785 Hyperlipidemia, unspecified: Secondary | ICD-10-CM | POA: Diagnosis not present

## 2015-09-06 DIAGNOSIS — R7309 Other abnormal glucose: Secondary | ICD-10-CM | POA: Diagnosis not present

## 2015-09-06 DIAGNOSIS — I10 Essential (primary) hypertension: Secondary | ICD-10-CM

## 2015-09-06 NOTE — Progress Notes (Signed)
Patient ID: Katie Cox, female   DOB: 1956/04/09, 60 y.o.   MRN: [id number]   This very nice 60 y.o. MWF presents for  follow up with Hypertension, Hyperlipidemia, Pre-Diabetes and Vitamin D Deficiency.    Patient is treated for HTN since 2000and BP has been controlled at home. Today's BP: 114/80 mmHg. Patient has had no complaints of any cardiac type chest pain, palpitations, dyspnea/orthopnea/PND, dizziness, claudication, or dependent edema.   Hyperlipidemia is controlled with diet. Last Lipids were at goal with Cholesterol 176; HDL 61; LDL 84; Triglycerides 155 on 02/05/2015.   Also, the patient is screened proactively for PreDiabetes and she denies symptoms of reactive hypoglycemia, diabetic polys, paresthesias or visual blurring.  Last A1c was 5.2% on 02/05/2015.    Further, the patient also has history of Vitamin D Deficiency of "28" in 2008  and supplements vitamin D without any suspected side-effects. Last vitamin D was  68 on 02/05/2015.    Medication Sig  . aspirin 81 MG  Take 81 mg by mouth every other day.   . bumetanide  2 MG TAKE 1 TABLET TWICE A DAY FOR BLOOD PRESSURE AND FLUID  . VITAMIN D Take 2,000 Units by mouth daily.   Marland Kitchen dicyclomine  20 MG  Take 20 mg by mouth 2 (two) times daily.  . Multiple Vitamin  Take 1 tablet by mouth daily.  . potassium chloride  20 MEQ TAKE 1 TABLET TWICE A DAY  . ranitidine  300 MG TAKE 1 TABLET DAILY FOR ACID REFLUX  . ULORIC 40 MG  TAKE 1 TABLET DAILY  . vitamin C  500 MG  Take 1 tablet (500 mg total) by mouth daily.  . magnesium 30 MG  Take 30 mg by mouth 2 (two) times daily.  Marland Kitchen COLACE 100 MG  Take 1 capsule (100 mg total) by mouth 2 (two) times daily.   Allergies  Allergen Reactions  . Alprazolam   . Prednisone     Only high doses  . Allopurinol Rash   PMHx:   Past Medical History  Diagnosis Date  . Contact lens/glasses fitting     wears contacts or glasses  . GERD (gastroesophageal reflux disease)   . Hyperlipidemia   .  Hypertension   . Vitamin D deficiency   . Peripheral neuropathy (HCC)     in feet  . Irritable bowel syndrome (IBS)   . PONV (postoperative nausea and vomiting)     nausea, slow to wake up at times   Immunization History  Administered Date(s) Administered  . PPD Test 01/25/2014, 02/05/2015  . Pneumococcal Polysaccharide-23 11/23/2008  . Tdap 11/29/2009   Past Surgical History  Procedure Laterality Date  . Goiter removed fro thyroid    . Foot surgery      both feet - little toes on each foot  . Colonoscopy    . Cholecystectomy  04/14/2012    Procedure: LAPAROSCOPIC CHOLECYSTECTOMY;  Surgeon: Rolm Bookbinder, MD;  Location: [facility];  Service: General;  Laterality: N/A;  . Open reduction internal fixation (orif) distal radial fracture Right 07/04/2015    Procedure: OPEN REDUCTION INTERNAL FIXATION (ORIF) RIGHT DISTAL RADIUS FRACTURE;  Surgeon: Iran Planas, MD;  Location: [facility];  Service: Orthopedics;  Laterality: Right;   FHx:    Reviewed / unchanged  SHx:    Reviewed / unchanged  Systems Review:  Constitutional: Denies fever, chills, wt changes, headaches, insomnia, fatigue, night sweats, change in appetite. Eyes: Denies redness, blurred vision, diplopia,  discharge, itchy, watery eyes.  ENT: Denies discharge, congestion, post nasal drip, epistaxis, sore throat, earache, hearing loss, dental pain, tinnitus, vertigo, sinus pain, snoring.  CV: Denies chest pain, palpitations, irregular heartbeat, syncope, dyspnea, diaphoresis, orthopnea, PND, claudication or edema. Respiratory: denies cough, dyspnea, DOE, pleurisy, hoarseness, laryngitis, wheezing.  Gastrointestinal: Denies dysphagia, odynophagia, heartburn, reflux, water brash, abdominal pain or cramps, nausea, vomiting, bloating, diarrhea, constipation, hematemesis, melena, hematochezia  or hemorrhoids. Genitourinary: Denies dysuria, frequency, urgency, nocturia, hesitancy, discharge, hematuria or flank  pain. Musculoskeletal: Denies arthralgias, myalgias, stiffness, jt. swelling, pain, limping or strain/sprain.  Skin: Denies pruritus, rash, hives, warts, acne, eczema or change in skin lesion(s). Neuro: No weakness, tremor, incoordination, spasms, paresthesia or pain. Psychiatric: Denies confusion, memory loss or sensory loss. Endo: Denies change in weight, skin or hair change.  Heme/Lymph: No excessive bleeding, bruising or enlarged lymph nodes.  Physical Exam  BP 114/80 mmHg  Pulse 64  Temp(Src) 97.5 F (36.4 C)  Resp 16  Ht 5\' 7"  (1.702 m)  Wt 150 lb 12.8 oz (68.402 kg)  BMI 23.61 kg/m2  Appears well nourished and in no distress. Eyes: PERRLA, EOMs, conjunctiva no swelling or erythema. Sinuses: No frontal/maxillary tenderness ENT/Mouth: EAC's clear, TM's nl w/o erythema, bulging. Nares clear w/o erythema, swelling, exudates. Oropharynx clear without erythema or exudates. Oral hygiene is good. Tongue normal, non obstructing. Hearing intact.  Neck: Supple. Thyroid nl. Car 2+/2+ without bruits, nodes or JVD. Chest: Respirations nl with BS clear & equal w/o rales, rhonchi, wheezing or stridor.  Cor: Heart sounds normal w/ regular rate and rhythm without sig. murmurs, gallops, clicks, or rubs. Peripheral pulses normal and equal  without edema.  Abdomen: Soft & bowel sounds normal. Non-tender w/o guarding, rebound, hernias, masses, or organomegaly.  Lymphatics: Unremarkable.  Musculoskeletal: Full ROM all peripheral extremities, joint stability, 5/5 strength, and normal gait.  Skin: Warm, dry without exposed rashes, lesions or ecchymosis apparent.  Neuro: Cranial nerves intact, reflexes equal bilaterally. Sensory-motor testing grossly intact. Tendon reflexes grossly intact.  Pysch: Alert & oriented x 3.  Insight and judgement nl & appropriate. No ideations.  Assessment and Plan:  1. Essential hypertension  - TSH  2. Hyperlipidemia  - Lipid panel - TSH  3. Abnormal  glucose  - Hemoglobin A1c - Insulin, random  4. Vitamin D deficiency  - VITAMIN D 25 Hydroxy   5. Hyperuricemia  - Uric acid  6. Gastroesophageal reflux disease   7. Medication management  - CBC with Differential/Platelet - BASIC METABOLIC PANEL WITH GFR - Hepatic function panel - Magnesium   Recommended regular exercise, BP monitoring, weight control, and discussed med and SE's. Recommended labs to assess and monitor clinical status. Further disposition pending results of labs. Over 30 minutes of exam, counseling, chart review was performed

## 2015-09-06 NOTE — Patient Instructions (Signed)

## 2015-09-07 LAB — BASIC METABOLIC PANEL WITH GFR
BUN: 23 mg/dL (ref 7–25)
CALCIUM: 10.2 mg/dL (ref 8.6–10.4)
CO2: 29 mmol/L (ref 20–31)
CREATININE: 0.97 mg/dL (ref 0.50–0.99)
Chloride: 98 mmol/L (ref 98–110)
GFR, EST AFRICAN AMERICAN: 73 mL/min (ref >=60)
GFR, EST NON AFRICAN AMERICAN: 64 mL/min (ref >=60)
Glucose, Bld: 67 mg/dL (ref 65–99)
Potassium: 3.4 mmol/L — ABNORMAL LOW (ref 3.5–5.3)
SODIUM: 140 mmol/L (ref 135–146)

## 2015-09-07 LAB — CBC WITH DIFFERENTIAL/PLATELET
BASOS PCT: 1 % (ref 0–1)
Basophils Absolute: 0.1 10*3/uL (ref 0.0–0.1)
EOS ABS: 0.1 10*3/uL (ref 0.0–0.7)
EOS PCT: 1 % (ref 0–5)
HCT: 40.6 % (ref 36.0–46.0)
Hemoglobin: 13.9 g/dL (ref 12.0–15.0)
Lymphocytes Relative: 49 % — ABNORMAL HIGH (ref 12–46)
Lymphs Abs: 5.6 10*3/uL — ABNORMAL HIGH (ref 0.7–4.0)
MCH: 32.2 pg (ref 26.0–34.0)
MCHC: 34.2 g/dL (ref 30.0–36.0)
MCV: 94 fL (ref 78.0–100.0)
MONO ABS: 0.8 10*3/uL (ref 0.1–1.0)
MONOS PCT: 7 % (ref 3–12)
MPV: 11.2 fL (ref 8.6–12.4)
NEUTROS ABS: 4.8 10*3/uL (ref 1.7–7.7)
Neutrophils Relative %: 42 % — ABNORMAL LOW (ref 43–77)
Platelets: 369 10*3/uL (ref 150–400)
RBC: 4.32 MIL/uL (ref 3.87–5.11)
RDW: 13.1 % (ref 11.5–15.5)
WBC: 11.4 10*3/uL — AB (ref 4.0–10.5)

## 2015-09-07 LAB — HEPATIC FUNCTION PANEL
ALT: 31 U/L — ABNORMAL HIGH (ref 6–29)
AST: 35 U/L (ref 10–35)
Albumin: 4.8 g/dL (ref 3.6–5.1)
Alkaline Phosphatase: 76 U/L (ref 33–130)
BILIRUBIN DIRECT: 0.1 mg/dL (ref <=0.2)
BILIRUBIN INDIRECT: 0.6 mg/dL (ref 0.2–1.2)
TOTAL PROTEIN: 7.4 g/dL (ref 6.1–8.1)
Total Bilirubin: 0.7 mg/dL (ref 0.2–1.2)

## 2015-09-07 LAB — LIPID PANEL
CHOL/HDL RATIO: 3.1 ratio (ref <=5.0)
CHOLESTEROL: 185 mg/dL (ref 125–200)
HDL: 60 mg/dL (ref >=46)
LDL Cholesterol: 83 mg/dL (ref <130)
TRIGLYCERIDES: 212 mg/dL — AB (ref <150)
VLDL: 42 mg/dL — AB (ref <30)

## 2015-09-07 LAB — HEMOGLOBIN A1C
HEMOGLOBIN A1C: 5.2 % (ref <5.7)
MEAN PLASMA GLUCOSE: 103 mg/dL (ref <117)

## 2015-09-07 LAB — MAGNESIUM: MAGNESIUM: 2 mg/dL (ref 1.5–2.5)

## 2015-09-07 LAB — VITAMIN D 25 HYDROXY (VIT D DEFICIENCY, FRACTURES): VIT D 25 HYDROXY: 62 ng/mL (ref 30–100)

## 2015-09-07 LAB — URIC ACID: URIC ACID, SERUM: 3.4 mg/dL (ref 2.4–7.0)

## 2015-09-07 LAB — INSULIN, RANDOM: Insulin: 4.6 u[IU]/mL (ref 2.0–19.6)

## 2015-09-07 LAB — TSH: TSH: 1.93 mIU/L

## 2015-09-13 ENCOUNTER — Encounter: Payer: Self-pay | Admitting: Internal Medicine

## 2015-10-03 ENCOUNTER — Other Ambulatory Visit: Payer: Self-pay | Admitting: Internal Medicine

## 2015-10-08 ENCOUNTER — Ambulatory Visit: Payer: 59

## 2015-11-06 ENCOUNTER — Ambulatory Visit
Admission: RE | Admit: 2015-11-06 | Discharge: 2015-11-06 | Disposition: A | Discharge status: Home / Self Care | Payer: 59 | Source: Ambulatory Visit

## 2015-11-06 DIAGNOSIS — Z1231 Encounter for screening mammogram for malignant neoplasm of breast: Secondary | ICD-10-CM

## 2015-12-01 ENCOUNTER — Other Ambulatory Visit: Payer: Self-pay | Admitting: Obstetrics and Gynecology

## 2015-12-01 DIAGNOSIS — E2839 Other primary ovarian failure: Secondary | ICD-10-CM

## 2015-12-10 ENCOUNTER — Ambulatory Visit (INDEPENDENT_AMBULATORY_CARE_PROVIDER_SITE_OTHER): Payer: 59 | Admitting: Physician Assistant

## 2015-12-10 ENCOUNTER — Encounter: Payer: Self-pay | Admitting: Physician Assistant

## 2015-12-10 VITALS — BP 122/64 | HR 68 | Temp 97.7°F | Resp 16 | Ht 67.0 in | Wt 151.2 lb

## 2015-12-10 DIAGNOSIS — E790 Hyperuricemia without signs of inflammatory arthritis and tophaceous disease: Secondary | ICD-10-CM

## 2015-12-10 DIAGNOSIS — E559 Vitamin D deficiency, unspecified: Secondary | ICD-10-CM | POA: Diagnosis not present

## 2015-12-10 DIAGNOSIS — I10 Essential (primary) hypertension: Secondary | ICD-10-CM | POA: Diagnosis not present

## 2015-12-10 DIAGNOSIS — R7309 Other abnormal glucose: Secondary | ICD-10-CM

## 2015-12-10 DIAGNOSIS — Z79899 Other long term (current) drug therapy: Secondary | ICD-10-CM

## 2015-12-10 DIAGNOSIS — G6289 Other specified polyneuropathies: Secondary | ICD-10-CM | POA: Diagnosis not present

## 2015-12-10 DIAGNOSIS — E785 Hyperlipidemia, unspecified: Secondary | ICD-10-CM

## 2015-12-10 LAB — CBC WITH DIFFERENTIAL/PLATELET
BASOS PCT: 1 %
Basophils Absolute: 107 cells/uL (ref 0–200)
EOS ABS: 107 {cells}/uL (ref 15–500)
Eosinophils Relative: 1 %
HEMATOCRIT: 40.5 % (ref 35.0–45.0)
Hemoglobin: 14.2 g/dL (ref 11.7–15.5)
LYMPHS ABS: 4601 {cells}/uL — AB (ref 850–3900)
Lymphocytes Relative: 43 %
MCH: 33.1 pg — AB (ref 27.0–33.0)
MCHC: 35.1 g/dL (ref 32.0–36.0)
MCV: 94.4 fL (ref 80.0–100.0)
MONO ABS: 963 {cells}/uL — AB (ref 200–950)
MPV: 10.6 fL (ref 7.5–12.5)
Monocytes Relative: 9 %
NEUTROS ABS: 4922 {cells}/uL (ref 1500–7800)
Neutrophils Relative %: 46 %
Platelets: 394 10*3/uL (ref 140–400)
RBC: 4.29 MIL/uL (ref 3.80–5.10)
RDW: 12.7 % (ref 11.0–15.0)
WBC: 10.7 10*3/uL (ref 3.8–10.8)

## 2015-12-10 MED ORDER — TOPIRAMATE 50 MG PO TABS: 50.0000 mg | ORAL_TABLET | Freq: Two times a day (BID) | ORAL | Status: DC

## 2015-12-10 NOTE — Progress Notes (Signed)
Assessment and Plan:  1. Hypertension -Continue medication, monitor blood pressure at home. Continue DASH diet.  Reminder to go to the ER if any CP, SOB, nausea, dizziness, severe HA, changes vision/speech, left arm numbness and tingling and jaw pain.  2. Cholesterol -Continue diet and exercise. Check cholesterol.   3. Idiopathic peripheral neuropathy Not on any medications at this time - will try topamax, does not want lyrica/elavil/gabapentin due to weight gain/fluid retention  4. Vitamin D Def - check level and continue medications.   5. Low back pain Following with ortho   Continue diet and meds as discussed. Further disposition pending results of labs. Over 30 minutes of exam, counseling, chart review, and critical decision making was performed  Future Appointments Date Time Provider [place]  01/07/2016 3:30 PM [facility] DX DEXA 1 [facility]  02/27/2016 3:00 PM Unk Pinto, MD [facility] None    HPI 60 y.o. female  presents for 3 month follow up on hypertension, cholesterol, and vitamin D deficiency.   Her blood pressure has been controlled at home, she is on bumex and potassium, today their BP is BP: 122/64 mmHg  She does not workout. She denies chest pain, shortness of breath, dizziness.  She is not on cholesterol medication and denies myalgias. Her cholesterol is at goal. The cholesterol last visit was:   Lab Results  Component Value Date   CHOL 185 09/06/2015   HDL 60 09/06/2015   LDLCALC 83 09/06/2015   TRIG 212* 09/06/2015   CHOLHDL 3.1 09/06/2015    Last A1C in the office was:  Lab Results  Component Value Date   HGBA1C 5.2 09/06/2015   Patient is on Vitamin D supplement.   Lab Results  Component Value Date   VD25OH 62 09/06/2015     She has idiopathic peripheral neuropathy, on amitriptyline. Patient is on uloric for gout and does not report a recent flare.  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 Getting DEXA next month   Current Medications:  Current Outpatient Prescriptions on File Prior to Visit  Medication Sig Dispense Refill  . aspirin 81 MG tablet Take 81 mg by mouth every other day.     . bumetanide (BUMEX) 2 MG tablet TAKE 1 TABLET TWICE A DAY FOR BLOOD PRESSURE AND FLUID 180 tablet 1  . Cholecalciferol (VITAMIN D PO) Take 2,000 Units by mouth daily.     Marland Kitchen dicyclomine (BENTYL) 20 MG tablet Take 20 mg by mouth 2 (two) times daily.    . Magnesium 250 MG TABS Take 2 tablets by mouth 2 (two) times daily.    . Multiple Vitamin (MULTIVITAMIN) tablet Take 1 tablet by mouth daily.    . potassium chloride SA (K-DUR,KLOR-CON) 20 MEQ tablet TAKE 1 TABLET TWICE A DAY 180 tablet 1  . ranitidine (ZANTAC) 300 MG tablet TAKE 1 TABLET DAILY FOR ACID REFLUX 90 tablet 4  . ULORIC 40 MG tablet TAKE 1 TABLET DAILY 90 tablet 2   No current facility-administered medications on file prior to visit.   Medical History:  Past Medical History  Diagnosis Date  . Contact lens/glasses fitting     wears contacts or glasses  . GERD (gastroesophageal reflux disease)   . Hyperlipidemia   . Hypertension   . Vitamin D deficiency   . Peripheral neuropathy (HCC)     in feet  . Irritable  bowel syndrome (IBS)   . PONV (postoperative nausea and vomiting)     nausea, slow to wake up at times   Allergies:  Allergies  Allergen Reactions  . Alprazolam   . Prednisone     Only high doses  . Allopurinol Rash     Review of Systems:  Review of Systems  Constitutional: Negative.   HENT: Negative for congestion, ear discharge, ear pain, hearing loss, nosebleeds, sore throat and tinnitus.   Eyes: Negative.   Respiratory: Negative.  Negative for stridor.   Cardiovascular: Negative.   Gastrointestinal: Negative for heartburn, nausea, vomiting, abdominal pain, diarrhea, constipation, blood in stool and melena.  Genitourinary: Negative.    Musculoskeletal: Positive for myalgias, back pain and falls (Jan). Negative for joint pain and neck pain.  Skin: Negative.   Neurological: Negative.  Negative for headaches.  Endo/Heme/Allergies: Negative.   Psychiatric/Behavioral: Negative.     Family history- Review and unchanged Social history- Review and unchanged Physical Exam: BP 122/64 mmHg  Pulse 68  Temp(Src) 97.7 F (36.5 C) (Temporal)  Resp 16  Ht 5\' 7"  (1.702 m)  Wt 151 lb 3.2 oz (68.584 kg)  BMI 23.68 kg/m2  SpO2 97% Wt Readings from Last 3 Encounters:  12/10/15 151 lb 3.2 oz (68.584 kg)  09/06/15 150 lb 12.8 oz (68.402 kg)  07/04/15 154 lb 12.2 oz (70.2 kg)   General Appearance: Well nourished, in no apparent distress. Eyes: PERRLA, EOMs, conjunctiva no swelling or erythema Sinuses: No Frontal/maxillary tenderness ENT/Mouth: Ext aud canals clear, TMs without erythema, bulging. No erythema, swelling, or exudate on post pharynx.  Tonsils not swollen or erythematous. Hearing normal.  Neck: Supple, thyroid normal.  Respiratory: Respiratory effort normal, BS equal bilaterally without rales, rhonchi, wheezing or stridor.  Cardio: RRR with no MRGs. Brisk peripheral pulses without edema.  Abdomen: Soft, + BS,  Non tender, no guarding, rebound, hernias, masses. Lymphatics: Non tender without lymphadenopathy.  Musculoskeletal: Full ROM, 5/5 strength, Normal gait Skin: Warm, dry without rashes, lesions, ecchymosis.  Neuro: Cranial nerves intact. Normal muscle tone, no cerebellar symptoms. Psych: Awake and oriented X 3, normal affect, Insight and Judgment appropriate.    Vicie Mutters, PA-C 4:41 PM [facility]

## 2015-12-10 NOTE — Patient Instructions (Signed)
Going to start you on topamax, start on 1/2 pill for 3-5 nights, can increase to a whole pill for 1-2 weeks, and can go up to 50mg  at night (2 pills). Let me know how you are doing with this.   Peripheral Neuropathy Peripheral neuropathy is a type of nerve damage. It affects nerves that carry signals between the spinal cord and other parts of the body. These are called peripheral nerves. With peripheral neuropathy, one nerve or a group of nerves may be damaged.  CAUSES  Many things can damage peripheral nerves. For some people with peripheral neuropathy, the cause is unknown. Some causes include:  Diabetes. This is the most common cause of peripheral neuropathy.  Injury to a nerve.  Pressure or stress on a nerve that lasts a long time.  Too little vitamin B. Alcoholism can lead to this.  Infections.  Autoimmune diseases, such as multiple sclerosis and systemic lupus erythematosus.  Inherited nerve diseases.  Some medicines, such as cancer drugs.  Toxic substances, such as lead and mercury.  Too little blood flowing to the legs.  Kidney disease.  Thyroid disease. SIGNS AND SYMPTOMS  Different people have different symptoms. The symptoms you have will depend on which of your nerves is damaged. Common symptoms include:  Loss of feeling (numbness) in the feet and hands.  Tingling in the feet and hands.  Pain that burns.  Very sensitive skin.  Weakness.  Not being able to move a part of the body (paralysis).  Muscle twitching.  Clumsiness or poor coordination.  Loss of balance.  Not being able to control your bladder.  Feeling dizzy.  Sexual problems. DIAGNOSIS  Peripheral neuropathy is a symptom, not a disease. Finding the cause of peripheral neuropathy can be hard. To figure that out, your health care provider will take a medical history and do a physical exam. A neurological exam will also be done. This involves checking things affected by your brain, spinal  cord, and nerves (nervous system). For example, your health care provider will check your reflexes, how you move, and what you can feel.  Other types of tests may also be ordered, such as:  Blood tests.  A test of the fluid in your spinal cord.  Imaging tests, such as CT scans or an MRI.  Electromyography (EMG). This test checks the nerves that control muscles.  Nerve conduction velocity tests. These tests check how fast messages pass through your nerves.  Nerve biopsy. A small piece of nerve is removed. It is then checked under a microscope. TREATMENT   Medicine is often used to treat peripheral neuropathy. Medicines may include:  Pain-relieving medicines. Prescription or over-the-counter medicine may be suggested.  Antiseizure medicine. This may be used for pain.  Antidepressants. These also may help ease pain from neuropathy.  Lidocaine. This is a numbing medicine. You might wear a patch or be given a shot.  Mexiletine. This medicine is typically used to help control irregular heart rhythms.  Surgery. Surgery may be needed to relieve pressure on a nerve or to destroy a nerve that is causing pain.  Physical therapy to help movement.  Assistive devices to help movement. HOME CARE INSTRUCTIONS   Only take over-the-counter or prescription medicines as directed by your health care provider. Follow the instructions carefully for any given medicines. Do not take any other medicines without first getting approval from your health care provider.  If you have diabetes, work closely with your health care provider to keep your blood  sugar under control.  If you have numbness in your feet:  Check every day for signs of injury or infection. Watch for redness, warmth, and swelling.  Wear padded socks and comfortable shoes. These help protect your feet.  Do not do things that put pressure on your damaged nerve.  Do not smoke. Smoking keeps blood from getting to damaged  nerves.  Avoid or limit alcohol. Too much alcohol can cause a lack of B vitamins. These vitamins are needed for healthy nerves.  Develop a good support system. Coping with peripheral neuropathy can be stressful. Talk to a mental health specialist or join a support group if you are struggling.  Follow up with your health care provider as directed. SEEK MEDICAL CARE IF:   You have new signs or symptoms of peripheral neuropathy.  You are struggling emotionally from dealing with peripheral neuropathy.  You have a fever. SEEK IMMEDIATE MEDICAL CARE IF:   You have an injury or infection that is not healing.  You feel very dizzy or begin vomiting.  You have chest pain.  You have trouble breathing.   This information is not intended to replace advice given to you by your health care provider. Make sure you discuss any questions you have with your health care provider.   Document Released: 05/30/2002 Document Revised: 02/19/2011 Document Reviewed: 02/14/2013 Elsevier Interactive Patient Education [employer].

## 2015-12-11 ENCOUNTER — Other Ambulatory Visit: Payer: Self-pay | Admitting: Physician Assistant

## 2015-12-11 ENCOUNTER — Other Ambulatory Visit: Payer: Self-pay | Admitting: Internal Medicine

## 2015-12-11 DIAGNOSIS — N179 Acute kidney failure, unspecified: Secondary | ICD-10-CM

## 2015-12-11 LAB — LIPID PANEL
Cholesterol: 173 mg/dL (ref 125–200)
HDL: 69 mg/dL (ref >=46)
LDL Cholesterol: 85 mg/dL (ref <130)
Total CHOL/HDL Ratio: 2.5 Ratio (ref <=5.0)
Triglycerides: 94 mg/dL (ref <150)
VLDL: 19 mg/dL (ref <30)

## 2015-12-11 LAB — HEPATIC FUNCTION PANEL
ALBUMIN: 4.6 g/dL (ref 3.6–5.1)
ALK PHOS: 77 U/L (ref 33–130)
ALT: 21 U/L (ref 6–29)
AST: 30 U/L (ref 10–35)
BILIRUBIN INDIRECT: 0.6 mg/dL (ref 0.2–1.2)
Bilirubin, Direct: 0.1 mg/dL (ref <=0.2)
TOTAL PROTEIN: 7.6 g/dL (ref 6.1–8.1)
Total Bilirubin: 0.7 mg/dL (ref 0.2–1.2)

## 2015-12-11 LAB — BASIC METABOLIC PANEL WITH GFR
BUN: 32 mg/dL — AB (ref 7–25)
CALCIUM: 9.9 mg/dL (ref 8.6–10.4)
CO2: 25 mmol/L (ref 20–31)
CREATININE: 1.45 mg/dL — AB (ref 0.50–0.99)
Chloride: 101 mmol/L (ref 98–110)
GFR, EST AFRICAN AMERICAN: 45 mL/min — AB (ref >=60)
GFR, EST NON AFRICAN AMERICAN: 39 mL/min — AB (ref >=60)
GLUCOSE: 81 mg/dL (ref 65–99)
Potassium: 3.8 mmol/L (ref 3.5–5.3)
Sodium: 143 mmol/L (ref 135–146)

## 2015-12-11 LAB — TSH: TSH: 1.6 mIU/L

## 2015-12-11 LAB — VITAMIN D 25 HYDROXY (VIT D DEFICIENCY, FRACTURES): Vit D, 25-Hydroxy: 70 ng/mL (ref 30–100)

## 2015-12-11 LAB — MAGNESIUM: MAGNESIUM: 2.1 mg/dL (ref 1.5–2.5)

## 2015-12-20 ENCOUNTER — Other Ambulatory Visit: Payer: 59

## 2015-12-20 DIAGNOSIS — N179 Acute kidney failure, unspecified: Secondary | ICD-10-CM

## 2015-12-20 LAB — BASIC METABOLIC PANEL WITH GFR
BUN: 25 mg/dL (ref 7–25)
CHLORIDE: 98 mmol/L (ref 98–110)
CO2: 26 mmol/L (ref 20–31)
Calcium: 9.9 mg/dL (ref 8.6–10.4)
Creat: 0.96 mg/dL (ref 0.50–0.99)
GFR, EST AFRICAN AMERICAN: 74 mL/min (ref >=60)
GFR, EST NON AFRICAN AMERICAN: 64 mL/min (ref >=60)
GLUCOSE: 67 mg/dL (ref 65–99)
POTASSIUM: 4.3 mmol/L (ref 3.5–5.3)
Sodium: 141 mmol/L (ref 135–146)

## 2015-12-20 LAB — MAGNESIUM: Magnesium: 2.2 mg/dL (ref 1.5–2.5)

## 2015-12-21 LAB — URINALYSIS, ROUTINE W REFLEX MICROSCOPIC
Bilirubin Urine: NEGATIVE
GLUCOSE, UA: NEGATIVE
HGB URINE DIPSTICK: NEGATIVE
Ketones, ur: NEGATIVE
LEUKOCYTES UA: NEGATIVE
NITRITE: NEGATIVE
PH: 6 (ref 5.0–8.0)
PROTEIN: NEGATIVE
Specific Gravity, Urine: 1.015 (ref 1.001–1.035)

## 2015-12-21 LAB — MICROALBUMIN / CREATININE URINE RATIO
Creatinine, Urine: 90 mg/dL (ref 20–320)
Microalb Creat Ratio: 20 ug/mg{creat}
Microalb, Ur: 1.8 mg/dL

## 2016-01-07 ENCOUNTER — Ambulatory Visit
Admission: RE | Admit: 2016-01-07 | Discharge: 2016-01-07 | Disposition: A | Discharge status: Home / Self Care | Payer: 59 | Source: Ambulatory Visit | Attending: Obstetrics and Gynecology | Admitting: Obstetrics and Gynecology

## 2016-01-07 DIAGNOSIS — E2839 Other primary ovarian failure: Secondary | ICD-10-CM

## 2016-01-09 ENCOUNTER — Other Ambulatory Visit: Payer: Self-pay | Admitting: Internal Medicine

## 2016-01-09 ENCOUNTER — Telehealth: Payer: Self-pay | Admitting: *Deleted

## 2016-01-09 NOTE — Telephone Encounter (Signed)
Patient called with concerns regarding taking Topamax she was given at her June 2017 appointment.  She read the side effects and was concerned about the memory and concentration issues listed on the pharmacy side effect list.  Per Dr Melford Aase, the Topamax should be safe for the patient to start and should not interact with her other medications. The patient states she will try the med and will call back if she has side effects. Per Dr Melford Aase, it is OK for her to continue the Ibuprofen.  The patient is aware.

## 2016-01-15 ENCOUNTER — Other Ambulatory Visit: Payer: Self-pay | Admitting: *Deleted

## 2016-01-15 MED ORDER — FEBUXOSTAT 40 MG PO TABS: 40.0000 mg | ORAL_TABLET | Freq: Every day | ORAL | 1 refills | Status: DC

## 2016-02-11 ENCOUNTER — Encounter: Payer: Self-pay | Admitting: Internal Medicine

## 2016-02-27 ENCOUNTER — Ambulatory Visit (INDEPENDENT_AMBULATORY_CARE_PROVIDER_SITE_OTHER): Payer: 59 | Admitting: Internal Medicine

## 2016-02-27 ENCOUNTER — Encounter: Payer: Self-pay | Admitting: Internal Medicine

## 2016-02-27 VITALS — BP 134/82 | HR 64 | Temp 97.2°F | Resp 16 | Ht 67.0 in | Wt 153.0 lb

## 2016-02-27 DIAGNOSIS — E785 Hyperlipidemia, unspecified: Secondary | ICD-10-CM

## 2016-02-27 DIAGNOSIS — Z136 Encounter for screening for cardiovascular disorders: Secondary | ICD-10-CM

## 2016-02-27 DIAGNOSIS — K219 Gastro-esophageal reflux disease without esophagitis: Secondary | ICD-10-CM

## 2016-02-27 DIAGNOSIS — Z0001 Encounter for general adult medical examination with abnormal findings: Secondary | ICD-10-CM

## 2016-02-27 DIAGNOSIS — R7309 Other abnormal glucose: Secondary | ICD-10-CM

## 2016-02-27 DIAGNOSIS — Z79899 Other long term (current) drug therapy: Secondary | ICD-10-CM

## 2016-02-27 DIAGNOSIS — Z111 Encounter for screening for respiratory tuberculosis: Secondary | ICD-10-CM

## 2016-02-27 DIAGNOSIS — Z0000 Encounter for general adult medical examination without abnormal findings: Secondary | ICD-10-CM | POA: Diagnosis not present

## 2016-02-27 DIAGNOSIS — E790 Hyperuricemia without signs of inflammatory arthritis and tophaceous disease: Secondary | ICD-10-CM

## 2016-02-27 DIAGNOSIS — I10 Essential (primary) hypertension: Secondary | ICD-10-CM

## 2016-02-27 DIAGNOSIS — R5383 Other fatigue: Secondary | ICD-10-CM

## 2016-02-27 DIAGNOSIS — E559 Vitamin D deficiency, unspecified: Secondary | ICD-10-CM

## 2016-02-27 DIAGNOSIS — Z1212 Encounter for screening for malignant neoplasm of rectum: Secondary | ICD-10-CM

## 2016-02-27 DIAGNOSIS — K581 Irritable bowel syndrome with constipation: Secondary | ICD-10-CM

## 2016-02-27 LAB — BASIC METABOLIC PANEL WITH GFR
BUN: 20 mg/dL (ref 7–25)
CHLORIDE: 98 mmol/L (ref 98–110)
CO2: 28 mmol/L (ref 20–31)
Calcium: 10 mg/dL (ref 8.6–10.4)
Creat: 1.03 mg/dL — ABNORMAL HIGH (ref 0.50–0.99)
GFR, EST NON AFRICAN AMERICAN: 59 mL/min — AB (ref >=60)
GFR, Est African American: 68 mL/min (ref >=60)
Glucose, Bld: 83 mg/dL (ref 65–99)
Potassium: 3.9 mmol/L (ref 3.5–5.3)
SODIUM: 140 mmol/L (ref 135–146)

## 2016-02-27 LAB — HEPATIC FUNCTION PANEL
ALT: 23 U/L (ref 6–29)
AST: 32 U/L (ref 10–35)
Albumin: 4.8 g/dL (ref 3.6–5.1)
Alkaline Phosphatase: 74 U/L (ref 33–130)
BILIRUBIN DIRECT: 0.2 mg/dL (ref <=0.2)
BILIRUBIN INDIRECT: 0.8 mg/dL (ref 0.2–1.2)
Total Bilirubin: 1 mg/dL (ref 0.2–1.2)
Total Protein: 8.1 g/dL (ref 6.1–8.1)

## 2016-02-27 LAB — LIPID PANEL
CHOL/HDL RATIO: 2.5 ratio (ref <=5.0)
Cholesterol: 200 mg/dL (ref 125–200)
HDL: 80 mg/dL (ref >=46)
LDL Cholesterol: 105 mg/dL (ref <130)
Triglycerides: 77 mg/dL (ref <150)
VLDL: 15 mg/dL (ref <30)

## 2016-02-27 LAB — CBC WITH DIFFERENTIAL/PLATELET
BASOS ABS: 98 {cells}/uL (ref 0–200)
Basophils Relative: 1 %
EOS PCT: 1 %
Eosinophils Absolute: 98 cells/uL (ref 15–500)
HCT: 42 % (ref 35.0–45.0)
HEMOGLOBIN: 14.8 g/dL (ref 11.7–15.5)
LYMPHS ABS: 4704 {cells}/uL — AB (ref 850–3900)
Lymphocytes Relative: 48 %
MCH: 33 pg (ref 27.0–33.0)
MCHC: 35.2 g/dL (ref 32.0–36.0)
MCV: 93.5 fL (ref 80.0–100.0)
MPV: 10.8 fL (ref 7.5–12.5)
Monocytes Absolute: 686 cells/uL (ref 200–950)
Monocytes Relative: 7 %
NEUTROS ABS: 4214 {cells}/uL (ref 1500–7800)
Neutrophils Relative %: 43 %
Platelets: 382 10*3/uL (ref 140–400)
RBC: 4.49 MIL/uL (ref 3.80–5.10)
RDW: 12.7 % (ref 11.0–15.0)
WBC: 9.8 10*3/uL (ref 3.8–10.8)

## 2016-02-27 LAB — URIC ACID: URIC ACID, SERUM: 3.7 mg/dL (ref 2.5–7.0)

## 2016-02-27 LAB — IRON AND TIBC
%SAT: 28 % (ref 11–50)
IRON: 119 ug/dL (ref 45–160)
TIBC: 421 ug/dL (ref 250–450)
UIBC: 302 ug/dL (ref 125–400)

## 2016-02-27 LAB — MAGNESIUM: Magnesium: 2.1 mg/dL (ref 1.5–2.5)

## 2016-02-27 NOTE — Progress Notes (Signed)
[facility]                   Unk Pinto, M.D.    Uvaldo Bristle. Silverio Lay, P.A.-C      Starlyn Skeans, P.A.-C  [facility] [address] [place] [id number] Telephone [phone] Telefax [phone]  Annual Screening/Preventative Visit  & Comprehensive Evaluation &  Examination     This very nice 60y.o.MWF presents for a Wellness/Preventative Visit & comprehensive evaluation and management of multiple medical co-morbidities.  Patient has been followed for HTN, Prediabetes, Hyperlipidemia and Vitamin D Deficiency. Patient also has hx/o IBS-C compensated and controlled on current diet and med regimen.       HTN predates circa 2000. Patient's BP has been controlled at home and patient denies any cardiac symptoms as chest pain, palpitations, shortness of breath, dizziness or ankle swelling. Today's BP is  134/82.     Patient's hyperlipidemia is controlled with diet and medications. Patient denies myalgias or other medication SE's. Last lipids were at goal: Lab Results  Component Value Date   CHOL 173 12/10/2015   HDL 69 12/10/2015   LDLCALC 85 12/10/2015   TRIG 94 12/10/2015   CHOLHDL 2.5 12/10/2015      Patient has a peripheral sensory neuropathy and is screened expectantly for prediabetes  and patient denies reactive hypoglycemic symptoms, visual blurring, diabetic polys, but does c/o burning dysthesias of her feet. Last A1c was at goal: Lab Results  Component Value Date   HGBA1C 5.2 09/06/2015      Finally, patient has history of Vitamin D Deficiency of "26" in 2008 and last Vitamin D was at goal: Lab Results  Component Value Date   VD25OH 64 12/10/2015   Current Outpatient Prescriptions on File Prior to Visit  Medication Sig  . aspirin 81 MG tablet Take 81 mg by mouth every other day.   . bumetanide (BUMEX) 2 MG tablet TAKE 1 TABLET TWICE A DAY FOR BLOOD PRESSURE AND FLUID   . Cholecalciferol (VITAMIN D PO) Take 2,000 Units by mouth daily.   Marland Kitchen dicyclomine (BENTYL) 20 MG tablet Take 20 mg by mouth 2 (two) times daily.  . febuxostat (ULORIC) 40 MG tablet Take 1 tablet (40 mg total) by mouth daily.  . Magnesium 250 MG TABS Take 2 tablets by mouth 2 (two) times daily.  . Multiple Vitamin (MULTIVITAMIN) tablet Take 1 tablet by mouth daily.  . potassium chloride SA (K-DUR,KLOR-CON) 20 MEQ tablet TAKE 1 TABLET TWICE A DAY  . ranitidine (ZANTAC) 300 MG tablet TAKE 1 TABLET DAILY FOR ACID REFLUX  . topiramate (TOPAMAX) 50 MG tablet Take 1 tablet (50 mg total) by mouth 2 (two) times daily. (Patient not taking: Reported on 02/27/2016)   No current facility-administered medications on file prior to visit.    Allergies  Allergen Reactions  . Alprazolam   . Prednisone     Only high doses  . Allopurinol Rash   Past Medical History:  Diagnosis Date  . Contact lens/glasses fitting    wears contacts or glasses  . GERD (gastroesophageal reflux disease)   . Hyperlipidemia   . Hypertension   . Irritable bowel syndrome (IBS)   . Peripheral neuropathy (HCC)    in feet  . PONV (postoperative nausea and vomiting)  nausea, slow to wake up at times  . Vitamin D deficiency    Health Maintenance  Topic Date Due  . COLONOSCOPY  11/13/2004  . ZOSTAVAX  11/14/2014  . INFLUENZA VACCINE  01/22/2016  . MAMMOGRAM  11/05/2017  . PAP SMEAR  03/30/2018  . TETANUS/TDAP  11/30/2019  . Hepatitis C Screening  Completed  . HIV Screening  Completed   Immunization History  Administered Date(s) Administered  . PPD Test 01/25/2014, 02/05/2015, 02/27/2016  . Pneumococcal Polysaccharide-23 11/23/2008  . Tdap 11/29/2009   Past Surgical History:  Procedure Laterality Date  . CHOLECYSTECTOMY  04/14/2012   Procedure: LAPAROSCOPIC CHOLECYSTECTOMY;  Surgeon: Rolm Bookbinder, MD;  Location: [place];  Service: General;  Laterality: N/A;  . COLONOSCOPY    . FOOT SURGERY      both feet - little toes on each foot  . goiter removed fro thyroid    . OPEN REDUCTION INTERNAL FIXATION (ORIF) DISTAL RADIAL FRACTURE Right 07/04/2015   Procedure: OPEN REDUCTION INTERNAL FIXATION (ORIF) RIGHT DISTAL RADIUS FRACTURE;  Surgeon: Iran Planas, MD;  Location: [facility];  Service: Orthopedics;  Laterality: Right;   Family History  Problem Relation Age of Onset  . Breast cancer Maternal Aunt   . Colon cancer Father   . Heart disease Mother    Social History  Substance Use Topics  . Smoking status: Never Smoker  . Smokeless tobacco: Never Used  . Alcohol use No    ROS Constitutional: Denies fever, chills, weight loss/gain, headaches, insomnia,  night sweats, and change in appetite. Does c/o fatigue. Eyes: Denies redness, blurred vision, diplopia, discharge, itchy, watery eyes.  ENT: Denies discharge, congestion, post nasal drip, epistaxis, sore throat, earache, hearing loss, dental pain, Tinnitus, Vertigo, Sinus pain, snoring.  Cardio: Denies chest pain, palpitations, irregular heartbeat, syncope, dyspnea, diaphoresis, orthopnea, PND, claudication, edema Respiratory: denies cough, dyspnea, DOE, pleurisy, hoarseness, laryngitis, wheezing.  Gastrointestinal: Denies dysphagia, heartburn, reflux, water brash, pain, cramps, nausea, vomiting, bloating, diarrhea, constipation, hematemesis, melena, hematochezia, jaundice, hemorrhoids Genitourinary: Denies dysuria, frequency, urgency, nocturia, hesitancy, discharge, hematuria, flank pain Breast: Breast lumps, nipple discharge, bleeding.  Musculoskeletal: Denies arthralgia, myalgia, stiffness, Jt. Swelling, pain, limp, and strain/sprain. Denies falls. Skin: Denies puritis, rash, hives, warts, acne, eczema, changing in skin lesion Neuro: No weakness, tremor, incoordination, spasms, paresthesia, pain Psychiatric: Denies confusion, memory loss, sensory loss. Denies Depression. Endocrine: Denies change in weight, skin, hair change,  nocturia, and paresthesia, diabetic polys, visual blurring, hyper / hypo glycemic episodes.  Heme/Lymph: No excessive bleeding, bruising, enlarged lymph nodes.  Physical Exam  BP 134/82   Pulse 64   Temp 97.2 F (36.2 C)   Resp 16   Ht 5\' 7"  (1.702 m)   Wt 153 lb (69.4 kg)   BMI 23.96 kg/m   General Appearance: Well nourished and in no apparent distress.  Eyes: PERRLA, EOMs, conjunctiva no swelling or erythema, normal fundi and vessels. Sinuses: No frontal/maxillary tenderness ENT/Mouth: EACs patent / TMs  nl. Nares clear without erythema, swelling, mucoid exudates. Oral hygiene is good. No erythema, swelling, or exudate. Tongue normal, non-obstructing. Tonsils not swollen or erythematous. Hearing normal.  Neck: Supple, thyroid normal. No bruits, nodes or JVD. Respiratory: Respiratory effort normal.  BS equal and clear bilateral without rales, rhonci, wheezing or stridor. Cardio: Heart sounds are normal with regular rate and rhythm and no murmurs, rubs or gallops. Peripheral pulses are normal and equal bilaterally without edema. No aortic or femoral bruits. Chest: symmetric with normal excursions and percussion.  Breasts: Symmetric, without lumps, nipple discharge, retractions, or fibrocystic changes.  Abdomen: Flat, soft with bowel sounds active. Nontender, no guarding, rebound, hernias, masses, or organomegaly.  Lymphatics: Non tender without lymphadenopathy.  Musculoskeletal: Full ROM all peripheral extremities, joint stability, 5/5 strength, and normal gait. Skin: Warm and dry without rashes, lesions, cyanosis, clubbing or  ecchymosis.  Neuro: Cranial nerves intact, reflexes equal bilaterally. Normal muscle tone, no cerebellar symptoms. Sensation decreased in a stocking distribution. Pysch: Alert and oriented X 3, normal affect, Insight and Judgment appropriate.   Assessment and Plan  1. Annual Preventative Screening Examination  - Microalbumin / creatinine urine ratio - [place],  RETROPERITNL ABD,  LTD - EKG 12-Lead - POC Hemoccult Bld/Stl (3-Cd Home Screen); Future - Urinalysis, Routine w reflex microscopic  - Vitamin B12 - Iron and TIBC - Uric acid - CBC with Differential/Platelet - BASIC METABOLIC PANEL WITH GFR - Hepatic function panel - Magnesium - Lipid panel - TSH - Hemoglobin A1c - Insulin, random - VITAMIN D 25 Hydroxy   2. Essential hypertension  - Microalbumin / creatinine urine ratio - [place], RETROPERITNL ABD,  LTD - EKG 12-Lead - TSH  3. Hyperlipidemia  - EKG 12-Lead - Lipid panel - TSH  4. Abnormal glucose  - EKG 12-Lead - Hemoglobin A1c - Insulin, random  5. Vitamin D deficiency  - VITAMIN D 25 Hydroxy   6. Hyperuricemia  - Uric acid  7. Gastroesophageal reflux disease   8. Irritable bowel syndrome with constipation   9. Screening for rectal cancer  - POC Hemoccult Bld/Stl   10. Screening examination for pulmonary tuberculosis   11. Screening for ischemic heart disease  - EKG 12-Lead  12. Screening for AAA (aortic abdominal aneurysm)  - [place], RETROPERITNL ABD,  LTD  13. Other fatigue  - Vitamin B12 - Iron and TIBC - CBC with Differential/Platelet - TSH  14. Medication management  - Urinalysis, Routine w reflex microscopic  - CBC with Differential/Platelet - BASIC METABOLIC PANEL WITH GFR - Hepatic function panel - Magnesium      Continue prudent diet as discussed, weight control, BP monitoring, regular exercise, and medications. Discussed med's effects and SE's. Screening labs and tests as requested with regular follow-up as recommended. Over 40 minutes of exam, counseling, chart review and high complex critical decision making was performed.

## 2016-02-27 NOTE — Patient Instructions (Signed)
Preventive Care for Adults  A healthy lifestyle and preventive care can promote health and wellness. Preventive health guidelines for women include the following key practices.  A routine yearly physical is a good way to check with your health care provider about your health and preventive screening. It is a chance to share any concerns and updates on your health and to receive a thorough exam.  Visit your dentist for a routine exam and preventive care every 6 months. Brush your teeth twice a day and floss once a day. Good oral hygiene prevents tooth decay and gum disease.  The frequency of eye exams is based on your age, health, family medical history, use of contact lenses, and other factors. Follow your health care provider's recommendations for frequency of eye exams.  Eat a healthy diet. Foods like vegetables, fruits, whole grains, low-fat dairy products, and lean protein foods contain the nutrients you need without too many calories. Decrease your intake of foods high in solid fats, added sugars, and salt. Eat the right amount of calories for you.Get information about a proper diet from your health care provider, if necessary.  Regular physical exercise is one of the most important things you can do for your health. Most adults should get at least 150 minutes of moderate-intensity exercise (any activity that increases your heart rate and causes you to sweat) each week. In addition, most adults need muscle-strengthening exercises on 2 or more days a week.  Maintain a healthy weight. The body mass index (BMI) is a screening tool to identify possible weight problems. It provides an estimate of body fat based on height and weight. Your health care provider can find your BMI and can help you achieve or maintain a healthy weight.For adults 20 years and older:  A BMI below 18.5 is considered underweight.  A BMI of 18.5 to 24.9 is normal.  A BMI of 25 to 29.9 is considered overweight.  A BMI of  30 and above is considered obese.  Maintain normal blood lipids and cholesterol levels by exercising and minimizing your intake of saturated fat. Eat a balanced diet with plenty of fruit and vegetables. Blood tests for lipids and cholesterol should begin at age 20 and be repeated every 5 years. If your lipid or cholesterol levels are high, you are over 50, or you are at high risk for heart disease, you may need your cholesterol levels checked more frequently.Ongoing high lipid and cholesterol levels should be treated with medicines if diet and exercise are not working.  If you smoke, find out from your health care provider how to quit. If you do not use tobacco, do not start.  Lung cancer screening is recommended for adults aged 55-80 years who are at high risk for developing lung cancer because of a history of smoking. A yearly low-dose CT scan of the lungs is recommended for people who have at least a 30-pack-year history of smoking and are a current smoker or have quit within the past 15 years. A pack year of smoking is smoking an average of 1 pack of cigarettes a day for 1 year (for example: 1 pack a day for 30 years or 2 packs a day for 15 years). Yearly screening should continue until the smoker has stopped smoking for at least 15 years. Yearly screening should be stopped for people who develop a health problem that would prevent them from having lung cancer treatment.  High blood pressure causes heart disease and increases the risk of   stroke. Your blood pressure should be checked at least every 1 to 2 years. Ongoing high blood pressure should be treated with medicines if weight loss and exercise do not work.  If you are 55-79 years old, ask your health care provider if you should take aspirin to prevent strokes.  Diabetes screening involves taking a blood sample to check your fasting blood sugar level. This should be done once every 3 years, after age 45, if you are within normal weight and  without risk factors for diabetes. Testing should be considered at a younger age or be carried out more frequently if you are overweight and have at least 1 risk factor for diabetes.  Breast cancer screening is essential preventive care for women. You should practice "breast self-awareness." This means understanding the normal appearance and feel of your breasts and may include breast self-examination. Any changes detected, no matter how small, should be reported to a health care provider. Women in their 20s and 30s should have a clinical breast exam (CBE) by a health care provider as part of a regular health exam every 1 to 3 years. After age 40, women should have a CBE every year. Starting at age 40, women should consider having a mammogram (breast X-ray test) every year. Women who have a family history of breast cancer should talk to their health care provider about genetic screening. Women at a high risk of breast cancer should talk to their health care providers about having an MRI and a mammogram every year.  Breast cancer gene (BRCA)-related cancer risk assessment is recommended for women who have family members with BRCA-related cancers. BRCA-related cancers include breast, ovarian, tubal, and peritoneal cancers. Having family members with these cancers may be associated with an increased risk for harmful changes (mutations) in the breast cancer genes BRCA1 and BRCA2. Results of the assessment will determine the need for genetic counseling and BRCA1 and BRCA2 testing.  Routine pelvic exams to screen for cancer are no longer recommended for nonpregnant women who are considered low risk for cancer of the pelvic organs (ovaries, uterus, and vagina) and who do not have symptoms. Ask your health care provider if a screening pelvic exam is right for you.  If you have had past treatment for cervical cancer or a condition that could lead to cancer, you need Pap tests and screening for cancer for at least 20  years after your treatment. If Pap tests have been discontinued, your risk factors (such as having a new sexual partner) need to be reassessed to determine if screening should be resumed. Some women have medical problems that increase the chance of getting cervical cancer. In these cases, your health care provider may recommend more frequent screening and Pap tests.  Colorectal cancer can be detected and often prevented. Most routine colorectal cancer screening begins at the age of 50 years and continues through age 75 years. However, your health care provider may recommend screening at an earlier age if you have risk factors for colon cancer. On a yearly basis, your health care provider may provide home test kits to check for hidden blood in the stool. Use of a small camera at the end of a tube, to directly examine the colon (sigmoidoscopy or colonoscopy), can detect the earliest forms of colorectal cancer. Talk to your health care provider about this at age 50, when routine screening begins. Direct exam of the colon should be repeated every 5-10 years through age 75 years, unless early forms of pre-cancerous   polyps or small growths are found.  Hepatitis C blood testing is recommended for all people born from 1945 through 1965 and any individual with known risks for hepatitis C.  Pra  Osteoporosis is a disease in which the bones lose minerals and strength with aging. This can result in serious bone fractures or breaks. The risk of osteoporosis can be identified using a bone density scan. Women ages 65 years and over and women at risk for fractures or osteoporosis should discuss screening with their health care providers. Ask your health care provider whether you should take a calcium supplement or vitamin D to reduce the rate of osteoporosis.  Menopause can be associated with physical symptoms and risks. Hormone replacement therapy is available to decrease symptoms and risks. You should talk to your  health care provider about whether hormone replacement therapy is right for you.  Use sunscreen. Apply sunscreen liberally and repeatedly throughout the day. You should seek shade when your shadow is shorter than you. Protect yourself by wearing long sleeves, pants, a wide-brimmed hat, and sunglasses year round, whenever you are outdoors.  Once a month, do a whole body skin exam, using a mirror to look at the skin on your back. Tell your health care provider of new moles, moles that have irregular borders, moles that are larger than a pencil eraser, or moles that have changed in shape or color.  Stay current with required vaccines (immunizations).  Influenza vaccine. All adults should be immunized every year.  Tetanus, diphtheria, and acellular pertussis (Td, Tdap) vaccine. Pregnant women should receive 1 dose of Tdap vaccine during each pregnancy. The dose should be obtained regardless of the length of time since the last dose. Immunization is preferred during the 27th-36th week of gestation. An adult who has not previously received Tdap or who does not know her vaccine status should receive 1 dose of Tdap. This initial dose should be followed by tetanus and diphtheria toxoids (Td) booster doses every 10 years. Adults with an unknown or incomplete history of completing a 3-dose immunization series with Td-containing vaccines should begin or complete a primary immunization series including a Tdap dose. Adults should receive a Td booster every 10 years.  Varicella vaccine. An adult without evidence of immunity to varicella should receive 2 doses or a second dose if she has previously received 1 dose. Pregnant females who do not have evidence of immunity should receive the first dose after pregnancy. This first dose should be obtained before leaving the health care facility. The second dose should be obtained 4-8 weeks after the first dose.  Human papillomavirus (HPV) vaccine. Females aged 13-26 years  who have not received the vaccine previously should obtain the 3-dose series. The vaccine is not recommended for use in pregnant females. However, pregnancy testing is not needed before receiving a dose. If a female is found to be pregnant after receiving a dose, no treatment is needed. In that case, the remaining doses should be delayed until after the pregnancy. Immunization is recommended for any person with an immunocompromised condition through the age of 26 years if she did not get any or all doses earlier. During the 3-dose series, the second dose should be obtained 4-8 weeks after the first dose. The third dose should be obtained 24 weeks after the first dose and 16 weeks after the second dose.  Zoster vaccine. One dose is recommended for adults aged 60 years or older unless certain conditions are present.  Measles, mumps, and rubella (  MMR) vaccine. Adults born before [birth year] generally are considered immune to measles and mumps. Adults born in [birth year] or later should have 1 or more doses of MMR vaccine unless there is a contraindication to the vaccine or there is laboratory evidence of immunity to each of the three diseases. A routine second dose of MMR vaccine should be obtained at least 28 days after the first dose for students attending postsecondary schools, health care workers, or international travelers. People who received inactivated measles vaccine or an unknown type of measles vaccine during 1963-1967 should receive 2 doses of MMR vaccine. People who received inactivated mumps vaccine or an unknown type of mumps vaccine before 1979 and are at high risk for mumps infection should consider immunization with 2 doses of MMR vaccine. For females of childbearing age, rubella immunity should be determined. If there is no evidence of immunity, females who are not pregnant should be vaccinated. If there is no evidence of immunity, females who are pregnant should delay immunization until after pregnancy.  Unvaccinated health care workers born before [birth year] who lack laboratory evidence of measles, mumps, or rubella immunity or laboratory confirmation of disease should consider measles and mumps immunization with 2 doses of MMR vaccine or rubella immunization with 1 dose of MMR vaccine.  Pneumococcal 13-valent conjugate (PCV13) vaccine. When indicated, a person who is uncertain of her immunization history and has no record of immunization should receive the PCV13 vaccine. An adult aged 39 years or older who has certain medical conditions and has not been previously immunized should receive 1 dose of PCV13 vaccine. This PCV13 should be followed with a dose of pneumococcal polysaccharide (PPSV23) vaccine. The PPSV23 vaccine dose should be obtained at least 8 weeks after the dose of PCV13 vaccine. An adult aged 62 years or older who has certain medical conditions and previously received 1 or more doses of PPSV23 vaccine should receive 1 dose of PCV13. The PCV13 vaccine dose should be obtained 1 or more years after the last PPSV23 vaccine dose.    Pneumococcal polysaccharide (PPSV23) vaccine. When PCV13 is also indicated, PCV13 should be obtained first. All adults aged 67 years and older should be immunized. An adult younger than age 45 years who has certain medical conditions should be immunized. Any person who resides in a nursing home or long-term care facility should be immunized. An adult smoker should be immunized. People with an immunocompromised condition and certain other conditions should receive both PCV13 and PPSV23 vaccines. People with human immunodeficiency virus (HIV) infection should be immunized as soon as possible after diagnosis. Immunization during chemotherapy or radiation therapy should be avoided. Routine use of PPSV23 vaccine is not recommended for American Indians, [place] Natives, or people younger than 65 years unless there are medical conditions that require PPSV23 vaccine. When indicated,  people who have unknown immunization and have no record of immunization should receive PPSV23 vaccine. One-time revaccination 5 years after the first dose of PPSV23 is recommended for people aged 19-64 years who have chronic kidney failure, nephrotic syndrome, asplenia, or immunocompromised conditions. People who received 1-2 doses of PPSV23 before age 23 years should receive another dose of PPSV23 vaccine at age 35 years or later if at least 5 years have passed since the previous dose. Doses of PPSV23 are not needed for people immunized with PPSV23 at or after age 38 years.  Preventive Services / Frequency   Ages 43 to 86 years  Blood pressure check.  Lipid and cholesterol check.  Lung  cancer screening. / Every year if you are aged 55-80 years and have a 30-pack-year history of smoking and currently smoke or have quit within the past 15 years. Yearly screening is stopped once you have quit smoking for at least 15 years or develop a health problem that would prevent you from having lung cancer treatment.  Clinical breast exam.** / Every year after age 40 years.  BRCA-related cancer risk assessment.** / For women who have family members with a BRCA-related cancer (breast, ovarian, tubal, or peritoneal cancers).  Mammogram.** / Every year beginning at age 40 years and continuing for as long as you are in good health. Consult with your health care provider.  Pap test.** / Every 3 years starting at age 30 years through age 65 or 70 years with a history of 3 consecutive normal Pap tests.  HPV screening.** / Every 3 years from ages 30 years through ages 65 to 70 years with a history of 3 consecutive normal Pap tests.  Fecal occult blood test (FOBT) of stool. / Every year beginning at age 50 years and continuing until age 75 years. You may not need to do this test if you get a colonoscopy every 10 years.  Flexible sigmoidoscopy or colonoscopy.** / Every 5 years for a flexible sigmoidoscopy or  every 10 years for a colonoscopy beginning at age 50 years and continuing until age 75 years.  Hepatitis C blood test.** / For all people born from 1945 through 1965 and any individual with known risks for hepatitis C.  Skin self-exam. / Monthly.  Influenza vaccine. / Every year.  Tetanus, diphtheria, and acellular pertussis (Tdap/Td) vaccine.** / Consult your health care provider. Pregnant women should receive 1 dose of Tdap vaccine during each pregnancy. 1 dose of Td every 10 years.  Varicella vaccine.** / Consult your health care provider. Pregnant females who do not have evidence of immunity should receive the first dose after pregnancy.  Zoster vaccine.** / 1 dose for adults aged 60 years or older.  Pneumococcal 13-valent conjugate (PCV13) vaccine.** / Consult your health care provider.  Pneumococcal polysaccharide (PPSV23) vaccine.** / 1 to 2 doses if you smoke cigarettes or if you have certain conditions.  Meningococcal vaccine.** / Consult your health care provider.  Hepatitis A vaccine.** / Consult your health care provider.  Hepatitis B vaccine.** / Consult your health care provider. Screening for abdominal aortic aneurysm (AAA)  by ultrasound is recommended for people over 50 who have history of high blood pressure or who are current or former smokers. ++++++++++++++++++ Recommend Adult Low Dose Aspirin or  coated  Aspirin 81 mg daily  To reduce risk of Colon Cancer 20 %,  Skin Cancer 26 % ,  Melanoma 46%  and  Pancreatic cancer 60% +++++++++++++++++++ Vitamin D goal  is between 70-100.  Please make sure that you are taking your Vitamin D as directed.  It is very important as a natural anti-inflammatory  helping hair, skin, and nails, as well as reducing stroke and heart attack risk.  It helps your bones and helps with mood. It also decreases numerous cancer risks so please take it as directed.  Low Vit D is associated with a 200-300% higher risk for CANCER  and  200-300% higher risk for HEART   ATTACK  &  STROKE.   ...................................... It is also associated with higher death rate at younger ages,  autoimmune diseases like Rheumatoid arthritis, Lupus, Multiple Sclerosis.    Also many other serious conditions, like depression,   Alzheimer's Dementia, infertility, muscle aches, fatigue, fibromyalgia - just to name a few. ++++++++++++++++++ Recommend the book "The END of DIETING" by Dr Joel Fuhrman  & the book "The END of DIABETES " by Dr Joel Fuhrman At [employer] - get book & Audio CD's    Being diabetic has a  300% increased risk for heart attack, stroke, cancer, and alzheimer- type vascular dementia. It is very important that you work harder with diet by avoiding all foods that are white. Avoid white rice (brown & wild rice is OK), white potatoes (sweetpotatoes in moderation is OK), White bread or wheat bread or anything made out of white flour like bagels, donuts, rolls, buns, biscuits, cakes, pastries, cookies, pizza crust, and pasta (made from white flour & egg whites) - vegetarian pasta or spinach or wheat pasta is OK. Multigrain breads like Arnold's or [employer], or multigrain sandwich thins or flatbreads.  Diet, exercise and weight loss can reverse and cure diabetes in the early stages.  Diet, exercise and weight loss is very important in the control and prevention of complications of diabetes which affects every system in your body, ie. Brain - dementia/stroke, eyes - glaucoma/blindness, heart - heart attack/heart failure, kidneys - dialysis, stomach - gastric paralysis, intestines - malabsorption, nerves - severe painful neuritis, circulation - gangrene & loss of a leg(s), and finally cancer and Alzheimers.    I recommend avoid fried & greasy foods,  sweets/candy, white rice (brown or wild rice or Quinoa is OK), white potatoes (sweet potatoes are OK) - anything made from white flour - bagels, doughnuts, rolls, buns, biscuits,white  and wheat breads, pizza crust and traditional pasta made of white flour & egg white(vegetarian pasta or spinach or wheat pasta is OK).  Multi-grain bread is OK - like multi-grain flat bread or sandwich thins. Avoid alcohol in excess. Exercise is also important.    Eat all the vegetables you want - avoid meat, especially red meat and dairy - especially cheese.  Cheese is the most concentrated form of trans-fats which is the worst thing to clog up our arteries. Veggie cheese is OK which can be found in the fresh produce section at [employer] or [employer] or [employer]  ++++++++++++++++++++++ DASH Eating Plan  DASH stands for "Dietary Approaches to Stop Hypertension."   The DASH eating plan is a healthy eating plan that has been shown to reduce high blood pressure (hypertension). Additional health benefits may include reducing the risk of type 2 diabetes mellitus, heart disease, and stroke. The DASH eating plan may also help with weight loss. WHAT DO I NEED TO KNOW ABOUT THE DASH EATING PLAN? For the DASH eating plan, you will follow these general guidelines:  Choose foods with a percent daily value for sodium of less than 5% (as listed on the food label).  Use salt-free seasonings or herbs instead of table salt or sea salt.  Check with your health care provider or pharmacist before using salt substitutes.  Eat lower-sodium products, often labeled as "lower sodium" or "no salt added."  Eat fresh foods.  Eat more vegetables, fruits, and low-fat dairy products.  Choose whole grains. Look for the word "whole" as the first word in the ingredient list.  Choose fish   Limit sweets, desserts, sugars, and sugary drinks.  Choose heart-healthy fats.  Eat veggie cheese   Eat more home-cooked food and less restaurant, buffet, and fast food.  Limit fried foods.  Cook foods using methods other than frying.  Limit canned   vegetables. If you do use them, rinse them well to decrease the  sodium.  When eating at a restaurant, ask that your food be prepared with less salt, or no salt if possible.                      WHAT FOODS CAN I EAT? Read Dr Joel Fuhrman's books on The End of Dieting & The End of Diabetes  Grains Whole grain or whole wheat bread. Brown rice. Whole grain or whole wheat pasta. Quinoa, bulgur, and whole grain cereals. Low-sodium cereals. Corn or whole wheat flour tortillas. Whole grain cornbread. Whole grain crackers. Low-sodium crackers.  Vegetables Fresh or frozen vegetables (raw, steamed, roasted, or grilled). Low-sodium or reduced-sodium tomato and vegetable juices. Low-sodium or reduced-sodium tomato sauce and paste. Low-sodium or reduced-sodium canned vegetables.   Fruits All fresh, canned (in natural juice), or frozen fruits.  Protein Products  All fish and seafood.  Dried beans, peas, or lentils. Unsalted nuts and seeds. Unsalted canned beans.  Dairy Low-fat dairy products, such as skim or 1% milk, 2% or reduced-fat cheeses, low-fat ricotta or cottage cheese, or plain low-fat yogurt. Low-sodium or reduced-sodium cheeses.  Fats and Oils Tub margarines without trans fats. Light or reduced-fat mayonnaise and salad dressings (reduced sodium). Avocado. Safflower, olive, or canola oils. Natural peanut or almond butter.  Other Unsalted popcorn and pretzels. The items listed above may not be a complete list of recommended foods or beverages. Contact your dietitian for more options.  ++++++++++++++++++  WHAT FOODS ARE NOT RECOMMENDED? Grains/ White flour or wheat flour White bread. White pasta. White rice. Refined cornbread. Bagels and croissants. Crackers that contain trans fat.  Vegetables  Creamed or fried vegetables. Vegetables in a . Regular canned vegetables. Regular canned tomato sauce and paste. Regular tomato and vegetable juices.  Fruits Dried fruits. Canned fruit in light or heavy syrup. Fruit juice.  Meat and Other Protein  Products Meat in general - RED meat & White meat.  Fatty cuts of meat. Ribs, chicken wings, all processed meats as bacon, sausage, bologna, salami, fatback, hot dogs, bratwurst and packaged luncheon meats.  Dairy Whole or 2% milk, cream, half-and-half, and cream cheese. Whole-fat or sweetened yogurt. Full-fat cheeses or blue cheese. Non-dairy creamers and whipped toppings. Processed cheese, cheese spreads, or cheese curds.  Condiments Onion and garlic salt, seasoned salt, table salt, and sea salt. Canned and packaged gravies. Worcestershire sauce. Tartar sauce. Barbecue sauce. Teriyaki sauce. Soy sauce, including reduced sodium. Steak sauce. Fish sauce. Oyster sauce. Cocktail sauce. Horseradish. Ketchup and mustard. Meat flavorings and tenderizers. Bouillon cubes. Hot sauce. Tabasco sauce. Marinades. Taco seasonings. Relishes.  Fats and Oils Butter, stick margarine, lard, shortening and bacon fat. Coconut, palm kernel, or palm oils. Regular salad dressings.  Pickles and olives. Salted popcorn and pretzels.  The items listed above may not be a complete list of foods and beverages to avoid.    

## 2016-02-28 LAB — TSH: TSH: 2.48 m[IU]/L

## 2016-02-28 LAB — HEMOGLOBIN A1C
HEMOGLOBIN A1C: 4.9 % (ref <5.7)
Mean Plasma Glucose: 94 mg/dL

## 2016-02-28 LAB — URINALYSIS, MICROSCOPIC ONLY
BACTERIA UA: NONE SEEN [HPF]
CRYSTALS: NONE SEEN [HPF]
Casts: NONE SEEN [LPF]
RBC / HPF: NONE SEEN RBC/HPF (ref <=2)
Squamous Epithelial / LPF: NONE SEEN [HPF] (ref <=5)
Yeast: NONE SEEN [HPF]

## 2016-02-28 LAB — MICROALBUMIN / CREATININE URINE RATIO
CREATININE, URINE: 15 mg/dL — AB (ref 20–320)
MICROALB UR: 0.8 mg/dL
MICROALB/CREAT RATIO: 53 ug/mg{creat} — AB (ref <30)

## 2016-02-28 LAB — URINALYSIS, ROUTINE W REFLEX MICROSCOPIC
Bilirubin Urine: NEGATIVE
Glucose, UA: NEGATIVE
HGB URINE DIPSTICK: NEGATIVE
KETONES UR: NEGATIVE
NITRITE: NEGATIVE
PH: 7.5 (ref 5.0–8.0)
Protein, ur: NEGATIVE
Specific Gravity, Urine: 1.006 (ref 1.001–1.035)

## 2016-02-28 LAB — VITAMIN B12: Vitamin B-12: 912 pg/mL (ref 200–1100)

## 2016-02-28 LAB — INSULIN, RANDOM: Insulin: 5.3 u[IU]/mL (ref 2.0–19.6)

## 2016-02-28 LAB — VITAMIN D 25 HYDROXY (VIT D DEFICIENCY, FRACTURES): Vit D, 25-Hydroxy: 69 ng/mL (ref 30–100)

## 2016-03-13 ENCOUNTER — Encounter: Payer: Self-pay | Admitting: Physician Assistant

## 2016-03-13 ENCOUNTER — Ambulatory Visit: Payer: Self-pay | Admitting: Internal Medicine

## 2016-03-13 ENCOUNTER — Ambulatory Visit (INDEPENDENT_AMBULATORY_CARE_PROVIDER_SITE_OTHER): Payer: 59 | Admitting: Physician Assistant

## 2016-03-13 VITALS — BP 116/70 | HR 60 | Temp 97.7°F | Resp 16 | Ht 67.0 in | Wt 151.1 lb

## 2016-03-13 DIAGNOSIS — J0110 Acute frontal sinusitis, unspecified: Secondary | ICD-10-CM

## 2016-03-13 MED ORDER — LEVOFLOXACIN 500 MG PO TABS
500.0000 mg | ORAL_TABLET | Freq: Every day | ORAL | 0 refills | Status: DC
Start: 2016-03-13 — End: 2016-03-26

## 2016-03-13 NOTE — Patient Instructions (Signed)

## 2016-03-13 NOTE — Progress Notes (Signed)
   Subjective:    Patient ID: Katie Cox, female    DOB: 1955/11/06, 60 y.o.   MRN: [id number]  HPI 60 y.o. WF presents with sinus symptoms x 2 weeks. Has been taking allegra and flonase with no help. A month ago went to UC and was on the zpak.  She has sinus pain, teeth pain. Has recurrent sinus infection last 3-4 years.   Blood pressure 116/70, pulse 60, temperature 97.7 F (36.5 C), resp. rate 16, height 5\' 7"  (1.702 m), weight 151 lb 1.6 oz (68.5 kg), SpO2 98 %.  Medications Current Outpatient Prescriptions on File Prior to Visit  Medication Sig  . AMITIZA 8 MCG capsule   . aspirin 81 MG tablet Take 81 mg by mouth every other day.   . bumetanide (BUMEX) 2 MG tablet TAKE 1 TABLET TWICE A DAY FOR BLOOD PRESSURE AND FLUID  . Cholecalciferol (VITAMIN D PO) Take 2,000 Units by mouth daily.   Marland Kitchen dicyclomine (BENTYL) 20 MG tablet Take 20 mg by mouth 2 (two) times daily.  . febuxostat (ULORIC) 40 MG tablet Take 1 tablet (40 mg total) by mouth daily.  . Magnesium 250 MG TABS Take 2 tablets by mouth 2 (two) times daily.  . Multiple Vitamin (MULTIVITAMIN) tablet Take 1 tablet by mouth daily.  . potassium chloride SA (K-DUR,KLOR-CON) 20 MEQ tablet TAKE 1 TABLET TWICE A DAY  . ranitidine (ZANTAC) 300 MG tablet TAKE 1 TABLET DAILY FOR ACID REFLUX  . topiramate (TOPAMAX) 50 MG tablet Take 1 tablet (50 mg total) by mouth 2 (two) times daily.   No current facility-administered medications on file prior to visit.     Problem list She has POLYP, COLON; Irritable bowel syndrome; GERD (gastroesophageal reflux disease); Hyperlipidemia; Hypertension; Vitamin D deficiency; Peripheral neuropathy (Katie Cox); Medication management; Hyperuricemia; Abnormal glucose; Fracture of right distal radius; and Encounter for general adult medical examination with abnormal findings on her problem list.  Review of Systems  Constitutional: Negative for chills and diaphoresis.  HENT: Positive for congestion, postnasal drip,  sinus pressure and sneezing. Negative for ear pain and sore throat.   Respiratory: Positive for cough. Negative for chest tightness, shortness of breath and wheezing.   Cardiovascular: Negative.   Gastrointestinal: Negative.   Genitourinary: Negative.   Musculoskeletal: Negative for neck pain.  Neurological: Positive for headaches.       Objective:   Physical Exam  Constitutional: She appears well-developed and well-nourished.  HENT:  Head: Normocephalic and atraumatic.  Right Ear: External ear normal.  Nose: Right sinus exhibits maxillary sinus tenderness. Right sinus exhibits no frontal sinus tenderness. Left sinus exhibits maxillary sinus tenderness. Left sinus exhibits no frontal sinus tenderness.  Eyes: Conjunctivae and EOM are normal.  Neck: Normal range of motion. Neck supple.  Cardiovascular: Normal rate, regular rhythm, normal heart sounds and intact distal pulses.   Pulmonary/Chest: Effort normal and breath sounds normal. No respiratory distress. She has no wheezes.  Abdominal: Soft. Bowel sounds are normal.  Lymphadenopathy:    She has cervical adenopathy.  Skin: Skin is warm and dry.       Assessment & Plan:  1. Subacute frontal sinusitis Recurrent sinusitis if happens again will send to ENT to rule out polyp - levofloxacin (LEVAQUIN) 500 MG tablet; Take 1 tablet (500 mg total) by mouth daily.  Dispense: 10 tablet; Refill: 0

## 2016-03-26 ENCOUNTER — Encounter: Payer: Self-pay | Admitting: Internal Medicine

## 2016-03-26 ENCOUNTER — Ambulatory Visit: Payer: 59 | Admitting: *Deleted

## 2016-03-26 VITALS — Ht 67.0 in | Wt 153.6 lb

## 2016-03-26 DIAGNOSIS — Z86010 Personal history of colonic polyps: Secondary | ICD-10-CM

## 2016-03-26 MED ORDER — SUPREP BOWEL PREP KIT 17.5-3.13-1.6 GM/177ML PO SOLN
1.0000 | Freq: Once | ORAL | 0 refills | Status: AC
Start: 2016-03-26 — End: 2016-03-26

## 2016-03-26 NOTE — Progress Notes (Signed)
Patient denies any allergies to egg or soy products. Patient denies complications with anesthesia/sedation.  Patient denies oxygen use at home and denies diet medications. Emmi instructions for colonoscopy  explained but patient refused. Pamphlet given.

## 2016-03-31 ENCOUNTER — Other Ambulatory Visit: Payer: Self-pay | Admitting: Internal Medicine

## 2016-04-09 ENCOUNTER — Ambulatory Visit (AMBULATORY_SURGERY_CENTER): Payer: 59 | Admitting: Internal Medicine

## 2016-04-09 ENCOUNTER — Encounter: Payer: Self-pay | Admitting: Internal Medicine

## 2016-04-09 VITALS — BP 122/70 | HR 50 | Temp 96.0°F | Resp 18 | Ht 67.0 in | Wt 153.0 lb

## 2016-04-09 DIAGNOSIS — K635 Polyp of colon: Secondary | ICD-10-CM

## 2016-04-09 DIAGNOSIS — D120 Benign neoplasm of cecum: Secondary | ICD-10-CM

## 2016-04-09 DIAGNOSIS — Z86010 Personal history of colonic polyps: Secondary | ICD-10-CM

## 2016-04-09 DIAGNOSIS — D122 Benign neoplasm of ascending colon: Secondary | ICD-10-CM

## 2016-04-09 DIAGNOSIS — D125 Benign neoplasm of sigmoid colon: Secondary | ICD-10-CM

## 2016-04-09 MED ORDER — SODIUM CHLORIDE 0.9 % IV SOLN: 500.0000 mL | INTRAVENOUS | Status: DC

## 2016-04-09 NOTE — Progress Notes (Signed)
Called to room to assist during endoscopic procedure.  Patient ID and intended procedure confirmed with present staff. Received instructions for my participation in the procedure from the performing physician.  

## 2016-04-09 NOTE — Op Note (Signed)
[place] Patient Name: Katie Cox Procedure Date: 04/09/2016 8:08 AM MRN: [id number] Endoscopist: Jerene Bears , MD Age: 60 Referring MD:  Date of Birth: 04-21-1956 Gender: Female Account #: [id number] Procedure:                Colonoscopy Indications:              Screening for colorectal malignant neoplasm, Last                            colonoscopy 10 years ago Medicines:                Monitored Anesthesia Care Procedure:                Pre-Anesthesia Assessment:                           - Prior to the procedure, a History and Physical                            was performed, and patient medications and                            allergies were reviewed. The patient's tolerance of                            previous anesthesia was also reviewed. The risks                            and benefits of the procedure and the sedation                            options and risks were discussed with the patient.                            All questions were answered, and informed consent                            was obtained. Prior Anticoagulants: The patient has                            taken no previous anticoagulant or antiplatelet                            agents. ASA Grade Assessment: II - A patient with                            mild systemic disease. After reviewing the risks                            and benefits, the patient was deemed in                            satisfactory condition to undergo the procedure.  After obtaining informed consent, the colonoscope                            was passed under direct vision. Throughout the                            procedure, the patient's blood pressure, pulse, and                            oxygen saturations were monitored continuously. The                            Model PCF-H190L [id number]) scope was introduced                            through the anus and advanced to the  the cecum,                            identified by appendiceal orifice and ileocecal                            valve. The colonoscopy was performed without                            difficulty. The patient tolerated the procedure                            well. The quality of the bowel preparation was                            good. The ileocecal valve, appendiceal orifice, and                            rectum were photographed. Scope In: 8:12:09 AM Scope Out: 8:57:30 AM Scope Withdrawal Time: 0 hours 41 minutes 12 seconds  Total Procedure Duration: 0 hours 45 minutes 21 seconds  Findings:                 A 8 mm polyp was found in the cecum. The polyp was                            sessile. The polyp was removed with a hot snare.                            Resection and retrieval were complete.                           A 6 mm polyp was found in the ileocecal valve. The                            polyp was sessile. The polyp was removed with a hot  snare. Resection and retrieval were complete.                           A 3 mm polyp was found in the ascending colon. The                            polyp was sessile. The polyp was removed with a                            cold biopsy forceps. Resection and retrieval were                            complete.                           A 30 mm polyp was found in the mid ascending colon.                            The polyp was multi-lobulated. The polyp was                            removed with a saline injection-lift technique                            using a hot snare. Resection and retrieval were                            complete. To prevent bleeding after the                            polypectomy, two hemostatic clips were successfully                            placed (MR conditional). Area was tattooed with an                            injection of 1 mL of [place] ink. Polyp removed with                             Jabier Mutton net.                           Two sessile polyps were found in the sigmoid colon.                            The polyps were 3 to 5 mm in size. These polyps                            were removed with a cold snare. Resection and                            retrieval were complete.  Scattered small-mouthed diverticula were found in                            the sigmoid colon and descending colon.                           The retroflexed view of the distal rectum and anal                            verge was normal and showed no anal or rectal                            abnormalities. Complications:            No immediate complications. Estimated Blood Loss:     Estimated blood loss was minimal. Impression:               - One 8 mm polyp in the cecum, removed with a hot                            snare. Resected and retrieved.                           - One 6 mm polyp at the ileocecal valve, removed                            with a hot snare. Resected and retrieved.                           - One 3 mm polyp in the ascending colon, removed                            with a cold biopsy forceps. Resected and retrieved.                           - One 30 mm polyp in the mid ascending colon,                            removed using injection-lift and a hot snare.                            Resected and retrieved. Clips (MR conditional) were                            placed. Tattooed.                           - Two 3 to 5 mm polyps in the sigmoid colon,                            removed with a cold snare. Resected and retrieved.                           - Diverticulosis in the sigmoid  colon and in the                            descending colon.                           - The entire examined colon is normal. Recommendation:           - Patient has a contact number available for                            emergencies. The signs and symptoms  of potential                            delayed complications were discussed with the                            patient. Return to normal activities tomorrow.                            Written discharge instructions were provided to the                            patient.                           - Resume previous diet.                           - Continue present medications.                           - Await pathology results.                           - No ibuprofen, naproxen, or other non-steroidal                            anti-inflammatory drugs for 3 weeks after polyp                            removal.                           - Repeat colonoscopy in 3-6 months to review the                            polypectomy site. Jerene Bears, MD 04/09/2016 9:05:55 AM This report has been signed electronically.

## 2016-04-09 NOTE — Patient Instructions (Signed)
No NSAIDS (Motrin, Ibuprofen, Advil, Naprosyn, Aleve etc. ) for three weeks, November 8,2017.   YOU HAD AN ENDOSCOPIC PROCEDURE TODAY AT [facility]:   Refer to the procedure report that was given to you for any specific questions about what was found during the examination.  If the procedure report does not answer your questions, please call your gastroenterologist to clarify.  If you requested that your care partner not be given the details of your procedure findings, then the procedure report has been included in a sealed envelope for you to review at your convenience later.  YOU SHOULD EXPECT: Some feelings of bloating in the abdomen. Passage of more gas than usual.  Walking can help get rid of the air that was put into your GI tract during the procedure and reduce the bloating. If you had a lower endoscopy (such as a colonoscopy or flexible sigmoidoscopy) you may notice spotting of blood in your stool or on the toilet paper. If you underwent a bowel prep for your procedure, you may not have a normal bowel movement for a few days.  Please Note:  You might notice some irritation and congestion in your nose or some drainage.  This is from the oxygen used during your procedure.  There is no need for concern and it should clear up in a day or so.  SYMPTOMS TO REPORT IMMEDIATELY:   Following lower endoscopy (colonoscopy or flexible sigmoidoscopy):  Excessive amounts of blood in the stool  Significant tenderness or worsening of abdominal pains  Swelling of the abdomen that is new, acute  Fever of 100F or higher   For urgent or emergent issues, a gastroenterologist can be reached at any hour by calling [phone].   DIET:  We do recommend a small meal at first, but then you may proceed to your regular diet.  Drink plenty of fluids but you should avoid alcoholic beverages for 24 hours.  ACTIVITY:  You should plan to take it easy for the rest of today and you should NOT DRIVE  or use heavy machinery until tomorrow (because of the sedation medicines used during the test).    FOLLOW UP: Our staff will call the number listed on your records the next business day following your procedure to check on you and address any questions or concerns that you may have regarding the information given to you following your procedure. If we do not reach you, we will leave a message.  However, if you are feeling well and you are not experiencing any problems, there is no need to return our call.  We will assume that you have returned to your regular daily activities without incident.  If any biopsies were taken you will be contacted by phone or by letter within the next 1-3 weeks.  Please call us at [phone] if you have not heard about the biopsies in 3 weeks.    SIGNATURES/CONFIDENTIALITY: You and/or your care partner have signed paperwork which will be entered into your electronic medical record.  These signatures attest to the fact that that the information above on your After Visit Summary has been reviewed and is understood.  Full responsibility of the confidentiality of this discharge information lies with you and/or your care-partner.

## 2016-04-09 NOTE — Progress Notes (Signed)
Report to PACU, RN, vss, BBS= Clear.  

## 2016-04-10 ENCOUNTER — Telehealth: Payer: Self-pay

## 2016-04-10 NOTE — Telephone Encounter (Signed)
  Follow up Call-  Call back number 04/09/2016  Post procedure Call Back phone  # [phone] or work [phone]  Permission to leave phone message Yes  Some recent data might be hidden     Patient questions:  Do you have a fever, pain , or abdominal swelling? No. Pain Score  0 *  Have you tolerated food without any problems? Yes.    Have you been able to return to your normal activities? Yes.    Do you have any questions about your discharge instructions: Diet   No. Medications  No. Follow up visit  No.  Do you have questions or concerns about your Care? Yes.    Actions: * If pain score is 4 or above: No action needed, pain <4.   Pt had questions about the clip placed in her colon.  I explained why the clip was placed and also how the clip will sluff off.  The pt also asked questions about diverticulosis which I answered.  Pt said she saw a small amount of bright, red blood last night which I explained was normal.  Pt to continue to monitor for bleeding and call if any questions.  maw

## 2016-04-11 ENCOUNTER — Telehealth: Payer: Self-pay | Admitting: Internal Medicine

## 2016-04-11 NOTE — Telephone Encounter (Signed)
Colonoscopy report reviewed. Agree with watching closely. She did have a significant polyp removed and would be at risk potentially for significant post polypectomy bleeding. However, if bleeding remains minor continue to observe. If significant, to the ER.

## 2016-04-11 NOTE — Telephone Encounter (Signed)
Doc of the Day Calls about seeing blood in bowel movement. Spoke with the patient. Colonoscopy with polypectomy 04/09/16. First bowel movement today. Saw small amount of blood with the stool. No abdominal pain. No bowel movement since. Describes the blood as a very small amount, streaks only. She will observe for further bleeding.

## 2016-04-11 NOTE — Telephone Encounter (Signed)
Left message to call.

## 2016-04-14 NOTE — Telephone Encounter (Signed)
[name] Please call and check on patient to ensure not further rectal bleeding or blood in stool since colonoscopy with multiple polypectomies She was in contact with [facility] and Dr. Henrene Pastor last week Path still pending

## 2016-04-14 NOTE — Telephone Encounter (Signed)
Spoke with pt and she states she has not seen anymore rectal bleeding or blood in her stool. States she is doing well.

## 2016-04-16 ENCOUNTER — Encounter: Payer: Self-pay | Admitting: Internal Medicine

## 2016-04-22 ENCOUNTER — Telehealth: Payer: Self-pay | Admitting: Internal Medicine

## 2016-04-22 NOTE — Telephone Encounter (Signed)
Pathology letter reviewed with pt. Recall is in epic.

## 2016-05-21 ENCOUNTER — Encounter: Payer: Self-pay | Admitting: Internal Medicine

## 2016-05-27 ENCOUNTER — Ambulatory Visit (INDEPENDENT_AMBULATORY_CARE_PROVIDER_SITE_OTHER): Payer: 59 | Admitting: Internal Medicine

## 2016-05-27 ENCOUNTER — Encounter: Payer: Self-pay | Admitting: Internal Medicine

## 2016-05-27 VITALS — BP 118/62 | HR 64 | Temp 98.2°F | Resp 16 | Ht 67.0 in | Wt 152.0 lb

## 2016-05-27 DIAGNOSIS — E782 Mixed hyperlipidemia: Secondary | ICD-10-CM

## 2016-05-27 DIAGNOSIS — J0141 Acute recurrent pansinusitis: Secondary | ICD-10-CM | POA: Diagnosis not present

## 2016-05-27 DIAGNOSIS — I10 Essential (primary) hypertension: Secondary | ICD-10-CM

## 2016-05-27 DIAGNOSIS — E559 Vitamin D deficiency, unspecified: Secondary | ICD-10-CM

## 2016-05-27 DIAGNOSIS — R7309 Other abnormal glucose: Secondary | ICD-10-CM

## 2016-05-27 DIAGNOSIS — Z79899 Other long term (current) drug therapy: Secondary | ICD-10-CM

## 2016-05-27 MED ORDER — MONTELUKAST SODIUM 10 MG PO TABS: 10.0000 mg | ORAL_TABLET | Freq: Every day | ORAL | 2 refills | Status: DC

## 2016-05-27 MED ORDER — FLUTICASONE PROPIONATE 50 MCG/ACT NA SUSP: 2.0000 | Freq: Every day | NASAL | 4 refills | Status: DC

## 2016-05-27 MED ORDER — DOXYCYCLINE HYCLATE 100 MG PO CAPS: 100.0000 mg | ORAL_CAPSULE | Freq: Two times a day (BID) | ORAL | 0 refills | Status: DC

## 2016-05-27 MED ORDER — AZELASTINE HCL 0.1 % NA SOLN: 2.0000 | Freq: Two times a day (BID) | NASAL | 2 refills | Status: DC

## 2016-05-27 NOTE — Progress Notes (Signed)
Assessment and Plan:  Hypertension:  -Continue medication,  -monitor blood pressure at home.  -Continue DASH diet.   -Reminder to go to the ER if any CP, SOB, nausea, dizziness, severe HA, changes vision/speech, left arm numbness and tingling, and jaw pain.  Cholesterol: -Continue diet and exercise.  -Check cholesterol.   Pre-diabetes: -Continue diet and exercise.  -Check A1C  Vitamin D Def: -check level -continue medications.   Sinusitis -doxycycline -flonase -astelin -zantac BID -daily antihistamine  Continue diet and meds as discussed. Further disposition pending results of labs.  HPI 60 y.o. female  presents for 3 month follow up with hypertension, hyperlipidemia, prediabetes and vitamin D.   Her blood pressure has been controlled at home, today their BP is BP: 118/62.   She does not workout. She denies chest pain, shortness of breath, dizziness.   She is on cholesterol medication and denies myalgias. Her cholesterol is at goal. The cholesterol last visit was:   Lab Results  Component Value Date   CHOL 200 02/27/2016   HDL 80 02/27/2016   LDLCALC 105 02/27/2016   TRIG 77 02/27/2016   CHOLHDL 2.5 02/27/2016     She has been working on diet and exercise for prediabetes, and denies foot ulcerations, hyperglycemia, hypoglycemia , increased appetite, nausea, paresthesia of the feet, polydipsia, polyuria, visual disturbances, vomiting and weight loss. Last A1C in the office was:  Lab Results  Component Value Date   HGBA1C 4.9 02/27/2016    Patient is on Vitamin D supplement.  Lab Results  Component Value Date   VD25OH 69 02/27/2016     She reports that she has been having some issues with her sinuses.  She reports that she has some streaks of blood and also clear rhinorrhea.  She is taking some mucinex.  She was taking it with a decongestant.  She reports that she is not taking an allergy medication.  She reports that she is taking a nasal spray currently.  She  reports that she feels like she chronically has a sinus infection.     Current Medications:  Current Outpatient Prescriptions on File Prior to Visit  Medication Sig Dispense Refill  . AMITIZA 8 MCG capsule     . aspirin 81 MG tablet Take 81 mg by mouth every other day.     . bumetanide (BUMEX) 2 MG tablet TAKE 1 TABLET TWICE A DAY FOR BLOOD PRESSURE AND FLUID 180 tablet 1  . Cholecalciferol (VITAMIN D PO) Take 2,000 Units by mouth daily.     . febuxostat (ULORIC) 40 MG tablet Take 1 tablet (40 mg total) by mouth daily. 14 tablet 1  . Magnesium 250 MG TABS Take 2 tablets by mouth 2 (two) times daily.    . Multiple Vitamin (MULTIVITAMIN) tablet Take 1 tablet by mouth daily.    . potassium chloride SA (K-DUR,KLOR-CON) 20 MEQ tablet TAKE 1 TABLET TWICE A DAY 180 tablet 1  . ranitidine (ZANTAC) 300 MG tablet TAKE 1 TABLET DAILY FOR ACID REFLUX 90 tablet 4  . topiramate (TOPAMAX) 50 MG tablet Take 1 tablet (50 mg total) by mouth 2 (two) times daily. 60 tablet 2   Current Facility-Administered Medications on File Prior to Visit  Medication Dose Route Frequency Provider Last Rate Last Dose  . 0.9 %  sodium chloride infusion  500 mL Intravenous Continuous Jerene Bears, MD        Medical History:  Past Medical History:  Diagnosis Date  . Allergy   . Contact  lens/glasses fitting    wears contacts or glasses  . GERD (gastroesophageal reflux disease)   . Hyperlipidemia   . Hypertension   . Irritable bowel syndrome (IBS)   . Peripheral neuropathy ([facility])    in feet  . PONV (postoperative nausea and vomiting)    nausea, slow to wake up at times  . Vitamin D deficiency     Allergies:  Allergies  Allergen Reactions  . Alprazolam   . Prednisone     Only high doses  . Allopurinol Rash     Review of Systems:  Review of Systems  Constitutional: Positive for malaise/fatigue. Negative for chills and fever.  HENT: Positive for congestion, ear pain, hearing loss and sore throat.    Respiratory: Positive for cough. Negative for sputum production, shortness of breath and wheezing.   Cardiovascular: Negative for chest pain, palpitations and leg swelling.  Gastrointestinal: Negative for blood in stool, constipation, diarrhea, heartburn, melena and vomiting.  Neurological: Positive for headaches.    Family history- Review and unchanged  Social history- Review and unchanged  Physical Exam: BP 118/62   Pulse 64   Temp 98.2 F (36.8 C) (Temporal)   Resp 16   Ht 5\' 7"  (1.702 m)   Wt 152 lb (68.9 kg)   BMI 23.81 kg/m  Wt Readings from Last 3 Encounters:  05/27/16 152 lb (68.9 kg)  04/09/16 153 lb (69.4 kg)  03/26/16 153 lb 9.6 oz (69.7 kg)    General Appearance: Well nourished well developed, in no apparent distress. Eyes: PERRLA, EOMs, conjunctiva no swelling or erythema ENT/Mouth: Ear canals normal without obstruction, swelling, erythma, discharge.  TMs normal bilaterally.  Oropharynx moist, clear, without exudate, or postoropharyngeal swelling. Neck: Supple, thyroid normal,no cervical adenopathy  Respiratory: Respiratory effort normal, Breath sounds clear A&P without rhonchi, wheeze, or rale.  No retractions, no accessory usage. Cardio: RRR with no MRGs. Brisk peripheral pulses without edema.  Abdomen: Soft, + BS,  Non tender, no guarding, rebound, hernias, masses. Musculoskeletal: Full ROM, 5/5 strength, Normal gait Skin: Warm, dry without rashes, lesions, ecchymosis.  Neuro: Awake and oriented X 3, Cranial nerves intact. Normal muscle tone, no cerebellar symptoms. Psych: Normal affect, Insight and Judgment appropriate.    Starlyn Skeans, PA-C 2:46 PM [facility]

## 2016-05-29 ENCOUNTER — Ambulatory Visit: Payer: Self-pay | Admitting: Internal Medicine

## 2016-06-08 ENCOUNTER — Other Ambulatory Visit: Payer: Self-pay | Admitting: Internal Medicine

## 2016-06-18 ENCOUNTER — Encounter: Payer: Self-pay | Admitting: Internal Medicine

## 2016-06-30 ENCOUNTER — Telehealth: Payer: Self-pay | Admitting: Internal Medicine

## 2016-06-30 NOTE — Telephone Encounter (Signed)
error 

## 2016-07-08 ENCOUNTER — Other Ambulatory Visit: Payer: Self-pay | Admitting: *Deleted

## 2016-07-08 MED ORDER — RANITIDINE HCL 300 MG PO TABS: ORAL_TABLET | ORAL | 1 refills | Status: DC

## 2016-07-08 MED ORDER — FEBUXOSTAT 40 MG PO TABS: 40.0000 mg | ORAL_TABLET | Freq: Every day | ORAL | 1 refills | Status: DC

## 2016-07-15 ENCOUNTER — Encounter: Payer: Self-pay | Admitting: Physician Assistant

## 2016-07-15 ENCOUNTER — Ambulatory Visit (INDEPENDENT_AMBULATORY_CARE_PROVIDER_SITE_OTHER): Payer: 59 | Admitting: Physician Assistant

## 2016-07-15 VITALS — BP 110/80 | HR 73 | Temp 97.0°F | Resp 16 | Wt 154.6 lb

## 2016-07-15 DIAGNOSIS — J329 Chronic sinusitis, unspecified: Secondary | ICD-10-CM | POA: Diagnosis not present

## 2016-07-15 MED ORDER — DOXYCYCLINE HYCLATE 100 MG PO CAPS: ORAL_CAPSULE | ORAL | 0 refills | Status: DC

## 2016-07-15 NOTE — Patient Instructions (Addendum)
pseudophendrine will help with congestion/pressure in your head  Astelin is more of an as needed, this helps with drainage.  Flonase will also help with pressure in your head, do this daily/ EVERY DAY  Remember to spray each nostril twice towards the outer part of your eye.  Do not sniff but instead pinch your nose and tilt your head back to help the medicine get into your sinuses.  The best time to do this is at bedtime.Stop if you get blurred vision or nose bleeds.  -While drinking fluids, pinch and hold nose close and swallow, to help open eustachian tubes to drain fluid behind ear drums.   -Please pick one of the over the counter allergy medications below and take it once daily for allergies.  It will also help with fluid behind ear drums. Claritin or loratadine cheapest but likely the weakest  Zyrtec or certizine at night because it can make you sleepy The strongest is allegra or fexafinadine   if worsening HA, changes vision/speech, imbalance, weakness go to the ER  We will refer you to ear nose and throat doctor.      HOME CARE INSTRUCTIONS   Do not stand or sit in one position for long periods of time. Do not sit with your legs crossed. Rest with your legs raised during the day.  Your legs have to be higher than your heart so that gravity will force the valves to open, so please really elevate your legs.   Wear elastic stockings or support hose. Do not wear other tight, encircling garments around the legs, pelvis, or waist.  [employer]  has a wide variety of well priced compression stockings. [place], [place] [id number] [id number] [place] ARE COPPER INFUSED COMPRESSION SOCKS AT [facility] OR [facility]  Walk as much as possible to increase blood flow.  Raise the foot of your bed at night with 2-inch blocks.  If you get a cut in the skin over the vein and the vein bleeds, lie down with your leg raised and press on it with a clean cloth until the bleeding  stops. Then place a bandage (dressing) on the cut. See your caregiver if it continues to bleed or needs stitches. SEEK MEDICAL CARE IF:   The skin around your ankle starts to break down.  You have pain, redness, tenderness, or hard swelling developing in your leg over a vein.  You are uncomfortable due to leg pain.

## 2016-07-15 NOTE — Progress Notes (Signed)
Subjective:    Patient ID: Katie Cox, female    DOB: Jan 30, 1956, 61 y.o.   MRN: [id number]  HPI 61 y.o. WF presents with recurrent sinus infection.  She states she constantly has maxillary and frontal pressure/pain constantly, has some drainage at this time from her nose but not all the time, she has fluid in her ears but denies any pain. Mild cough occ. Has had some teeth pain. No fever, chills. Has been treated x 2 years since 2016.    Blood pressure 110/80, pulse 73, temperature 97 F (36.1 C), resp. rate 16, weight 154 lb 9.6 oz (70.1 kg), SpO2 96 %.  Medications Current Outpatient Prescriptions on File Prior to Visit  Medication Sig  . AMITIZA 8 MCG capsule   . aspirin 81 MG tablet Take 81 mg by mouth every other day.   Marland Kitchen azelastine (ASTELIN) 0.1 % nasal spray Place 2 sprays into both nostrils 2 (two) times daily. Use in each nostril as directed  . bumetanide (BUMEX) 2 MG tablet TAKE 1 TABLET TWICE A DAY FOR BLOOD PRESSURE AND FLUID  . Cholecalciferol (VITAMIN D PO) Take 2,000 Units by mouth daily.   . febuxostat (ULORIC) 40 MG tablet Take 1 tablet (40 mg total) by mouth daily.  . fluticasone (FLONASE) 50 MCG/ACT nasal spray Place 2 sprays into both nostrils daily.  . Magnesium 250 MG TABS Take 2 tablets by mouth 2 (two) times daily.  . Multiple Vitamin (MULTIVITAMIN) tablet Take 1 tablet by mouth daily.  . potassium chloride SA (K-DUR,KLOR-CON) 20 MEQ tablet TAKE 1 TABLET TWICE A DAY  . ranitidine (ZANTAC) 300 MG tablet TAKE 1 TABLET DAILY FOR ACID REFLUX  . topiramate (TOPAMAX) 50 MG tablet Take 1 tablet (50 mg total) by mouth 2 (two) times daily.   Current Facility-Administered Medications on File Prior to Visit  Medication  . 0.9 %  sodium chloride infusion    Problem list She has POLYP, COLON; Irritable bowel syndrome; GERD (gastroesophageal reflux disease); Hyperlipidemia; Hypertension; Vitamin D deficiency; Peripheral neuropathy ([place]); Medication management;  Hyperuricemia; Abnormal glucose; Fracture of right distal radius; and Encounter for general adult medical examination with abnormal findings on her problem list.  Review of Systems  Constitutional: Negative for chills, diaphoresis and fever.  HENT: Positive for congestion, postnasal drip and sinus pressure. Negative for ear pain, sneezing and sore throat.   Respiratory: Negative for cough, chest tightness, shortness of breath and wheezing.   Cardiovascular: Negative.   Gastrointestinal: Negative.   Genitourinary: Negative.   Musculoskeletal: Negative for neck pain.  Neurological: Positive for headaches.       Objective:   Physical Exam  Constitutional: She appears well-developed and well-nourished.  HENT:  Head: Normocephalic and atraumatic.  Right Ear: External ear normal.  Nose: Right sinus exhibits maxillary sinus tenderness. Right sinus exhibits no frontal sinus tenderness. Left sinus exhibits maxillary sinus tenderness. Left sinus exhibits no frontal sinus tenderness.  Eyes: Conjunctivae and EOM are normal.  Neck: Normal range of motion. Neck supple.  Cardiovascular: Normal rate, regular rhythm, normal heart sounds and intact distal pulses.   Pulmonary/Chest: Effort normal and breath sounds normal. No respiratory distress. She has no wheezes.  Abdominal: Soft. Bowel sounds are normal.  Lymphadenopathy:    She has no cervical adenopathy.  Skin: Skin is warm and dry.       Assessment & Plan:  1. Recurrent sinus infections Continue flonase, astelin, get on allegra, will refer ENT - Immunoglobulins, Quantitative - Ambulatory referral  to ENT - doxycycline (VIBRAMYCIN) 100 MG capsule; Take 1 capsule twice daily with food  Dispense: 20 capsule; Refill: 0

## 2016-07-16 ENCOUNTER — Other Ambulatory Visit: Payer: Self-pay

## 2016-07-16 DIAGNOSIS — J329 Chronic sinusitis, unspecified: Secondary | ICD-10-CM

## 2016-07-16 LAB — IGG, IGA, IGM
IGG (IMMUNOGLOBIN G), SERUM: 1126 mg/dL (ref 694–1618)
IgA: 142 mg/dL (ref 81–463)
IgM, Serum: 175 mg/dL (ref 48–271)

## 2016-07-16 MED ORDER — DOXYCYCLINE HYCLATE 100 MG PO CAPS: ORAL_CAPSULE | ORAL | 0 refills | Status: DC

## 2016-07-17 NOTE — Progress Notes (Signed)
LVM for pt to return office call for LAB results.

## 2016-07-18 NOTE — Progress Notes (Signed)
Pt aware of lab results & voiced understanding of those results.

## 2016-07-18 NOTE — Progress Notes (Signed)
Would like to talk about the referral at her next [place] in March before scheduling that ENT referral. Pt states she has a lot of appt to handle before taking on any more.

## 2016-07-21 ENCOUNTER — Ambulatory Visit (AMBULATORY_SURGERY_CENTER): Payer: Self-pay | Admitting: *Deleted

## 2016-07-21 VITALS — Ht 67.0 in | Wt 150.0 lb

## 2016-07-21 DIAGNOSIS — Z86010 Personal history of colonic polyps: Secondary | ICD-10-CM

## 2016-07-21 MED ORDER — NA SULFATE-K SULFATE-MG SULF 17.5-3.13-1.6 GM/177ML PO SOLN: ORAL | 0 refills | Status: DC

## 2016-07-21 NOTE — Progress Notes (Signed)
Patient denies any allergies to eggs or soy. Patient denies any oxygen use at home and does not take any diet/weight loss medications. EMMI education declined by patient.

## 2016-07-22 ENCOUNTER — Encounter: Payer: Self-pay | Admitting: Internal Medicine

## 2016-07-25 ENCOUNTER — Telehealth: Payer: Self-pay

## 2016-07-25 NOTE — Telephone Encounter (Signed)
Katie Cox was informed of what to do for a sore throat :  suggest gargling with warm salt water and capful of peroxide. If this does not help, do the nasal sprays every other day and also use a humidifier in your room. .  Pt states she has been using warm salt water & will continue to use that.   Pt seemed happy & hung up

## 2016-07-25 NOTE — Telephone Encounter (Signed)
-----   Message from Vicie Mutters, [place] sent at 07/25/2016  7:50 AM EST ----- Regarding: RE: Please Advise Can be from drainage from her nose running over her throat at night or can be from the nasal sprays, suggest gargling with warm salt water and capful of peroxide. If this does not help, do the nasal sprays every other day and also use a humidifier in your room.  Estill Bamberg  ----- Message ----- From: Elenor Quinones, CMA Sent: 07/24/2016   2:21 PM To: Vicie Mutters, PA-C Subject: Please Advise                                  Pt states her throat is sore & wanted to know if the ABX could be causing her to have nasal drainage & sore throat due to it breaking up "stuff"?   Pt states she is using her nasal spray as well.  Pt states she does have an appt with an ENT coming soon.

## 2016-08-04 ENCOUNTER — Ambulatory Visit (AMBULATORY_SURGERY_CENTER): Payer: 59 | Admitting: Internal Medicine

## 2016-08-04 ENCOUNTER — Encounter: Payer: Self-pay | Admitting: Internal Medicine

## 2016-08-04 VITALS — BP 126/58 | HR 87 | Temp 95.5°F | Resp 20 | Ht 67.0 in | Wt 154.0 lb

## 2016-08-04 DIAGNOSIS — K635 Polyp of colon: Secondary | ICD-10-CM

## 2016-08-04 DIAGNOSIS — K639 Disease of intestine, unspecified: Secondary | ICD-10-CM | POA: Diagnosis not present

## 2016-08-04 DIAGNOSIS — K6389 Other specified diseases of intestine: Secondary | ICD-10-CM | POA: Diagnosis not present

## 2016-08-04 DIAGNOSIS — Z86010 Personal history of colonic polyps: Secondary | ICD-10-CM

## 2016-08-04 DIAGNOSIS — D125 Benign neoplasm of sigmoid colon: Secondary | ICD-10-CM

## 2016-08-04 MED ORDER — SODIUM CHLORIDE 0.9 % IV SOLN: 500.0000 mL | INTRAVENOUS | Status: DC

## 2016-08-04 NOTE — Op Note (Signed)
[facility] Patient Name: Katie Cox Procedure Date: 08/04/2016 1:58 PM MRN: [id number] Endoscopist: Jerene Bears , MD Age: 61 Referring MD:  Date of Birth: July 08, 1955 Gender: Female Account #: [id number] Procedure:                Colonoscopy Indications:              Surveillance: Personal history of removal of large,                            ascending colon adenoma on last colonoscopy (Oct                            2017), multiple other polyps removed at last                            colonoscopy Oct 2017 Medicines:                Monitored Anesthesia Care Procedure:                Pre-Anesthesia Assessment:                           - Prior to the procedure, a History and Physical                            was performed, and patient medications and                            allergies were reviewed. The patient's tolerance of                            previous anesthesia was also reviewed. The risks                            and benefits of the procedure and the sedation                            options and risks were discussed with the patient.                            All questions were answered, and informed consent                            was obtained. Prior Anticoagulants: The patient has                            taken no previous anticoagulant or antiplatelet                            agents. ASA Grade Assessment: II - A patient with                            mild systemic disease. After reviewing the risks  and benefits, the patient was deemed in                            satisfactory condition to undergo the procedure.                           After obtaining informed consent, the colonoscope                            was passed under direct vision. Throughout the                            procedure, the patient's blood pressure, pulse, and                            oxygen saturations were monitored continuously.  The                            Model PCF-H190L [id number]) scope was introduced                            through the anus and advanced to the the cecum,                            identified by appendiceal orifice and ileocecal                            valve. The colonoscopy was performed without                            difficulty. The patient tolerated the procedure                            well. The quality of the bowel preparation was                            good. The ileocecal valve, appendiceal orifice, and                            rectum were photographed. Scope In: 2:14:01 PM Scope Out: 2:35:14 PM Scope Withdrawal Time: 0 hours 16 minutes 11 seconds  Total Procedure Duration: 0 hours 21 minutes 13 seconds  Findings:                 The digital rectal exam was normal.                           A localized area of granular mucosa was found at                            the ileocecal valve with slight deformity likely                            from prior hot snare polypectomy. This was biopsied  with a cold forceps for histology to excluded                            residual adenoma.                           A small post polypectomy scar was found in the                            ascending colon just proximal to mucosal tattoo.                            Adjacent mucosal findings include slight                            nodularity. This was biopsied with a cold forceps                            for histology to exclude residual adenoma.                           Two sessile polyps were found in the sigmoid colon.                            The polyps were 3 to 5 mm in size. These polyps                            were removed with a cold snare. Resection and                            retrieval were complete.                           A few small-mouthed diverticula were found in the                            sigmoid colon.                            The retroflexed view of the distal rectum and anal                            verge was normal and showed no anal or rectal                            abnormalities. Complications:            No immediate complications. Estimated Blood Loss:     Estimated blood loss was minimal. Impression:               - Granularity at the ileocecal valve. Biopsied.                           - Post-polypectomy scar in the ascending colon.  Biopsied.                           - Two 3 to 5 mm polyps in the sigmoid colon,                            removed with a cold snare. Resected and retrieved.                           - Diverticulosis in the sigmoid colon.                           - The distal rectum and anal verge are normal on                            retroflexion view. Recommendation:           - Patient has a contact number available for                            emergencies. The signs and symptoms of potential                            delayed complications were discussed with the                            patient. Return to normal activities tomorrow.                            Written discharge instructions were provided to the                            patient.                           - Resume previous diet.                           - Continue present medications.                           - Await pathology results.                           - Repeat colonoscopy is recommended for                            surveillance. The colonoscopy date will be                            determined after pathology results from today's                            exam become available for review. Jerene Bears, MD 08/04/2016 2:43:15 PM This report has been signed electronically.

## 2016-08-04 NOTE — Progress Notes (Signed)
Called to room to assist during endoscopic procedure.  Patient ID and intended procedure confirmed with present staff. Received instructions for my participation in the procedure from the performing physician.  

## 2016-08-04 NOTE — Patient Instructions (Signed)
YOU HAD AN ENDOSCOPIC PROCEDURE TODAY AT THE [facility]:   Refer to the procedure report that was given to you for any specific questions about what was found during the examination.  If the procedure report does not answer your questions, please call your gastroenterologist to clarify.  If you requested that your care partner not be given the details of your procedure findings, then the procedure report has been included in a sealed envelope for you to review at your convenience later.  YOU SHOULD EXPECT: Some feelings of bloating in the abdomen. Passage of more gas than usual.  Walking can help get rid of the air that was put into your GI tract during the procedure and reduce the bloating. If you had a lower endoscopy (such as a colonoscopy or flexible sigmoidoscopy) you may notice spotting of blood in your stool or on the toilet paper. If you underwent a bowel prep for your procedure, you may not have a normal bowel movement for a few days.  Please Note:  You might notice some irritation and congestion in your nose or some drainage.  This is from the oxygen used during your procedure.  There is no need for concern and it should clear up in a day or so.  SYMPTOMS TO REPORT IMMEDIATELY:   Following lower endoscopy (colonoscopy or flexible sigmoidoscopy):  Excessive amounts of blood in the stool  Significant tenderness or worsening of abdominal pains  Swelling of the abdomen that is new, acute  Fever of 100F or higher   For urgent or emergent issues, a gastroenterologist can be reached at any hour by calling [phone].   DIET:  We do recommend a small meal at first, but then you may proceed to your regular diet.  Drink plenty of fluids but you should avoid alcoholic beverages for 24 hours.  ACTIVITY:  You should plan to take it easy for the rest of today and you should NOT DRIVE or use heavy machinery until tomorrow (because of the sedation medicines used during the test).     FOLLOW UP: Our staff will call the number listed on your records the next business day following your procedure to check on you and address any questions or concerns that you may have regarding the information given to you following your procedure. If we do not reach you, we will leave a message.  However, if you are feeling well and you are not experiencing any problems, there is no need to return our call.  We will assume that you have returned to your regular daily activities without incident.  If any biopsies were taken you will be contacted by phone or by letter within the next 1-3 weeks.  Please call us at [phone] if you have not heard about the biopsies in 3 weeks.    SIGNATURES/CONFIDENTIALITY: You and/or your care partner have signed paperwork which will be entered into your electronic medical record.  These signatures attest to the fact that that the information above on your After Visit Summary has been reviewed and is understood.  Full responsibility of the confidentiality of this discharge information lies with you and/or your care-partner.  Read all of the handouts given to you by your recovery room nurse. Thank-you for choosing us for your healthcare needs today. 

## 2016-08-04 NOTE — Progress Notes (Signed)
A/ox3, pleased with MAC, report to RN 

## 2016-08-05 ENCOUNTER — Telehealth: Payer: Self-pay | Admitting: *Deleted

## 2016-08-05 NOTE — Telephone Encounter (Signed)
  Follow up Call-  Call back number 08/04/2016 04/09/2016  Post procedure Call Back phone  # [phone] or work [phone]  Permission to leave phone message Yes Yes  Some recent data might be hidden     Patient questions:  Do you have a fever, pain , or abdominal swelling? No. Pain Score  0 *  Have you tolerated food without any problems? Yes.    Have you been able to return to your normal activities? Yes.    Do you have any questions about your discharge instructions: Diet   No. Medications  NO Follow up visit  No.  Do you have questions or concerns about your Care? No.  Actions: * If pain score is 4 or above: No action needed, pain <4.

## 2016-08-10 ENCOUNTER — Other Ambulatory Visit: Payer: Self-pay | Admitting: Physician Assistant

## 2016-08-12 ENCOUNTER — Encounter: Payer: Self-pay | Admitting: Internal Medicine

## 2016-08-28 ENCOUNTER — Ambulatory Visit (INDEPENDENT_AMBULATORY_CARE_PROVIDER_SITE_OTHER): Payer: 59 | Admitting: Internal Medicine

## 2016-08-28 VITALS — BP 112/76 | HR 64 | Temp 97.3°F | Resp 16 | Ht 67.0 in | Wt 149.4 lb

## 2016-08-28 DIAGNOSIS — I10 Essential (primary) hypertension: Secondary | ICD-10-CM | POA: Diagnosis not present

## 2016-08-28 DIAGNOSIS — E790 Hyperuricemia without signs of inflammatory arthritis and tophaceous disease: Secondary | ICD-10-CM | POA: Diagnosis not present

## 2016-08-28 DIAGNOSIS — E782 Mixed hyperlipidemia: Secondary | ICD-10-CM

## 2016-08-28 DIAGNOSIS — Z79899 Other long term (current) drug therapy: Secondary | ICD-10-CM

## 2016-08-28 DIAGNOSIS — R7309 Other abnormal glucose: Secondary | ICD-10-CM

## 2016-08-28 DIAGNOSIS — K219 Gastro-esophageal reflux disease without esophagitis: Secondary | ICD-10-CM

## 2016-08-28 DIAGNOSIS — E559 Vitamin D deficiency, unspecified: Secondary | ICD-10-CM | POA: Diagnosis not present

## 2016-08-28 MED ORDER — PREDNISONE 5 MG PO TABS: ORAL_TABLET | ORAL | 0 refills | Status: DC

## 2016-08-28 NOTE — Progress Notes (Signed)
[facility] Unk Pinto, M.D.        Uvaldo Bristle. Silverio Lay, P.A.-C       Starlyn Skeans, P.A.-C  [facility]                [address] [place] [address] Telephone [phone] Telefax [phone] ______________________________________________________________________     This very nice 61 y.o. DWM presents for 6 month follow up with Hypertension, Hyperlipidemia, Pre-Diabetes and Vitamin D Deficiency. Today, Patient is c/o of a several month hx/o peri-orbital sinus pressure & congestion unimproved with 2 courses of antibiotics or antihistamines and nasal steroids. Other problems include Gout controlled on current meds.      Patient is treated for HTN (2000) & BP has been controlled at home. Today's BP is at goal -  112/76. Patient has had no complaints of any cardiac type chest pain, palpitations, dyspnea/orthopnea/PND, dizziness, claudication, or dependent edema.     Hyperlipidemia is controlled with diet & meds. Patient denies myalgias or other med SE's. Last Lipids were near goal: Lab Results  Component Value Date   CHOL 200 02/27/2016   HDL 80 02/27/2016   LDLCALC 105 02/27/2016   TRIG 77 02/27/2016   CHOLHDL 2.5 02/27/2016      Also, the patient has history of a peripheral sensory neuropathy and is expectantly screened for PreDiabetes and has had no symptoms of reactive hypoglycemia, diabetic polys, paresthesias or visual blurring.  Last A1c was at goal: Lab Results  Component Value Date   HGBA1C 4.9 02/27/2016      Further, the patient also has history of Vitamin D Deficiency ("26" in 2008) and supplements vitamin D without any suspected side-effects. Last vitamin D was at goal: Lab Results  Component Value Date   VD25OH 69 02/27/2016   Current Outpatient Prescriptions on File Prior to Visit  Medication Sig  . AMITIZA 8 MCG capsule   . aspirin 81 MG tablet Take 81 mg by mouth  every other day.   . ASTELIN nasal spray 2 sprays into nostrils 2 x daily.   . bumetanide 2 MG tablet TAKE 1 TABLET TWICE A DAY   . VITAMIN D Take 2,000 Units by mouth daily.   Marland Kitchen ULORIC 40 MG tablet Take 1 tablet (40 mg total) by mouth daily.  Marland Kitchen FLONASE nasal spray Place 2 sprays into both nostrils daily.  . Magnesium 250 MG TABS Take 2 tablets by mouth 2 (two) times daily.  . Multiple Vitamin Take 1 tablet by mouth daily.  Marland Kitchen K-DUR 20 MEQ tablet TAKE 1 TABLET TWICE A DAY  . Ranitidine 300 MG TAKE 1 TABLET DAILY FOR ACID REFLUX  . topiramate 50 MG  TAKE 1 TABLET BY MOUTH TWICE A DAY   Allergies  Allergen Reactions  . Alprazolam Other (See Comments)     Unknown reaction per pt  . Prednisone     Only high doses  . Allopurinol Rash   PMHx:   Past Medical History:  Diagnosis Date  . Allergy   . Contact lens/glasses fitting    wears contacts or glasses  . GERD (gastroesophageal reflux disease)   . Hyperlipidemia   . Hypertension   . Irritable bowel syndrome (IBS)   . Peripheral neuropathy (HCC)    in feet  . PONV (postoperative nausea and vomiting)    nausea, slow to wake up at times  .  Vitamin D deficiency    Immunization History  Administered Date(s) Administered  . PPD Test 01/25/2014, 02/05/2015, 02/27/2016  . Pneumococcal Polysaccharide-23 11/23/2008  . Tdap 11/29/2009   Past Surgical History:  Procedure Laterality Date  . carpel tunnel surgery Right   . CHOLECYSTECTOMY  04/14/2012   Procedure: LAPAROSCOPIC CHOLECYSTECTOMY;  Surgeon: Rolm Bookbinder, MD;  Location: [place];  Service: General;  Laterality: N/A;  . COLONOSCOPY    . EYE SURGERY     at age 40yr  . FOOT SURGERY     both feet - little toes on each foot  . goiter removed fro thyroid    . OPEN REDUCTION INTERNAL FIXATION (ORIF) DISTAL RADIAL FRACTURE Right 07/04/2015   Procedure: OPEN REDUCTION INTERNAL FIXATION (ORIF) RIGHT DISTAL RADIUS FRACTURE;  Surgeon: Iran Planas, MD;  Location:  [place];  Service: Orthopedics;  Laterality: Right;  . WISDOM TOOTH EXTRACTION     FHx:    Reviewed / unchanged  SHx:    Reviewed / unchanged  Systems Review:  Constitutional: Denies fever, chills, wt changes, headaches, insomnia, fatigue, night sweats, change in appetite. Eyes: Denies redness, blurred vision, diplopia, discharge, itchy, watery eyes.  ENT: Denies discharge, congestion, post nasal drip, epistaxis, sore throat, earache, hearing loss, dental pain, tinnitus, vertigo, sinus pain, snoring.  CV: Denies chest pain, palpitations, irregular heartbeat, syncope, dyspnea, diaphoresis, orthopnea, PND, claudication or edema. Respiratory: denies cough, dyspnea, DOE, pleurisy, hoarseness, laryngitis, wheezing.  Gastrointestinal: Denies dysphagia, odynophagia, heartburn, reflux, water brash, abdominal pain or cramps, nausea, vomiting, bloating, diarrhea, constipation, hematemesis, melena, hematochezia  or hemorrhoids. Genitourinary: Denies dysuria, frequency, urgency, nocturia, hesitancy, discharge, hematuria or flank pain. Musculoskeletal: Denies arthralgias, myalgias, stiffness, jt. swelling, pain, limping or strain/sprain.  Skin: Denies pruritus, rash, hives, warts, acne, eczema or change in skin lesion(s). Neuro: No weakness, tremor, incoordination, spasms, paresthesia or pain. Psychiatric: Denies confusion, memory loss or sensory loss. Endo: Denies change in weight, skin or hair change.  Heme/Lymph: No excessive bleeding, bruising or enlarged lymph nodes.  Physical Exam  BP 112/76   Pulse 64   Temp 97.3 F (36.3 C)   Resp 16   Ht 5\' 7"  (1.702 m)   Wt 149 lb 6.4 oz (67.8 kg)   BMI 23.40 kg/m   Appears well nourished and in no distress.  Eyes: PERRLA, EOMs, conjunctiva no swelling or erythema. Sinuses: No frontal/maxillary tenderness ENT/Mouth: EAC's clear, TM's nl w/o erythema, bulging. Nares clear w/o erythema, swelling, exudates. Oropharynx clear without erythema or exudates.  Oral hygiene is good. Tongue normal, non obstructing. Hearing intact.  Neck: Supple. Thyroid nl. Car 2+/2+ without bruits, nodes or JVD. Chest: Respirations nl with BS clear & equal w/o rales, rhonchi, wheezing or stridor.  Cor: Heart sounds normal w/ regular rate and rhythm without sig. murmurs, gallops, clicks, or rubs. Peripheral pulses normal and equal  without edema.  Abdomen: Soft & bowel sounds normal. Non-tender w/o guarding, rebound, hernias, masses, or organomegaly.  Lymphatics: Unremarkable.  Musculoskeletal: Full ROM all peripheral extremities, joint stability, 5/5 strength, and normal gait.  Skin: Warm, dry without exposed rashes, lesions or ecchymosis apparent.  Neuro: Cranial nerves intact, reflexes equal bilaterally. Sensory-motor testing grossly intact. Tendon reflexes grossly intact.  Pysch: Alert & oriented x 3.  Insight and judgement nl & appropriate. No ideations.  Assessment and Plan:  1. Essential hypertension  - Continue medication, monitor blood pressure at home.  - Continue DASH diet. Reminder to go to the ER if any CP,  SOB,  nausea, dizziness, severe HA, changes vision/speech,  left arm numbness and tingling and jaw pain. - CBC with Differential/Platelet - BASIC METABOLIC PANEL WITH GFR - Magnesium - TSH  2. Mixed hyperlipidemia  - Continue diet/meds, exercise,& lifestyle modifications.  - Continue monitor periodic cholesterol/liver & renal functions  - Hepatic function panel - Lipid panel - TSH  3. Other abnormal glucose  - Continue diet, exercise, lifestyle modifications.  - Monitor appropriate labs. - Hemoglobin A1c - Insulin, random  4. Vitamin D deficiency  - Continue supplementation. - VITAMIN D 25 Hydroxy   5. Hyperuricemia  - Uric acid  6. Gastroesophageal reflux disease   7. Medication management - CBC with Differential/Platelet - BASIC METABOLIC PANEL WITH GFR - Hepatic function panel - Magnesium - Lipid panel - TSH -  Hemoglobin A1c - Insulin, random - VITAMIN D 25 Hydroxy  - Uric acid  8. Allergic rhino-sinusitis  - Empiric trial on pulse/taper dose of Prednisones, then alternating day extended taper.       Recommended regular exercise, BP monitoring, weight control, and discussed med and SE's. Recommended labs to assess and monitor clinical status. Further disposition pending results of labs. Over 30 minutes of exam, counseling, chart review was performed

## 2016-08-28 NOTE — Patient Instructions (Signed)

## 2016-08-29 ENCOUNTER — Encounter: Payer: Self-pay | Admitting: Internal Medicine

## 2016-08-29 LAB — CBC WITH DIFFERENTIAL/PLATELET
BASOS ABS: 97 {cells}/uL (ref 0–200)
Basophils Relative: 1 %
EOS ABS: 194 {cells}/uL (ref 15–500)
Eosinophils Relative: 2 %
HCT: 39.9 % (ref 35.0–45.0)
Hemoglobin: 13.8 g/dL (ref 11.7–15.5)
LYMPHS ABS: 3977 {cells}/uL — AB (ref 850–3900)
Lymphocytes Relative: 41 %
MCH: 32.6 pg (ref 27.0–33.0)
MCHC: 34.6 g/dL (ref 32.0–36.0)
MCV: 94.3 fL (ref 80.0–100.0)
MONOS PCT: 7 %
MPV: 11.1 fL (ref 7.5–12.5)
Monocytes Absolute: 679 cells/uL (ref 200–950)
NEUTROS ABS: 4753 {cells}/uL (ref 1500–7800)
NEUTROS PCT: 49 %
Platelets: 356 10*3/uL (ref 140–400)
RBC: 4.23 MIL/uL (ref 3.80–5.10)
RDW: 13.1 % (ref 11.0–15.0)
WBC: 9.7 10*3/uL (ref 3.8–10.8)

## 2016-08-29 LAB — LIPID PANEL
Cholesterol: 173 mg/dL (ref <200)
HDL: 60 mg/dL (ref >50)
LDL Cholesterol: 96 mg/dL (ref <100)
Total CHOL/HDL Ratio: 2.9 Ratio (ref <5.0)
Triglycerides: 87 mg/dL (ref <150)
VLDL: 17 mg/dL (ref <30)

## 2016-08-29 LAB — BASIC METABOLIC PANEL WITH GFR
BUN: 33 mg/dL — AB (ref 7–25)
CALCIUM: 9.6 mg/dL (ref 8.6–10.4)
CHLORIDE: 104 mmol/L (ref 98–110)
CO2: 27 mmol/L (ref 20–31)
Creat: 1.45 mg/dL — ABNORMAL HIGH (ref 0.50–0.99)
GFR, Est African American: 45 mL/min — ABNORMAL LOW (ref >=60)
GFR, Est Non African American: 39 mL/min — ABNORMAL LOW (ref >=60)
Glucose, Bld: 80 mg/dL (ref 65–99)
Potassium: 4.1 mmol/L (ref 3.5–5.3)
Sodium: 142 mmol/L (ref 135–146)

## 2016-08-29 LAB — MAGNESIUM: MAGNESIUM: 2 mg/dL (ref 1.5–2.5)

## 2016-08-29 LAB — HEPATIC FUNCTION PANEL
ALBUMIN: 4.3 g/dL (ref 3.6–5.1)
ALT: 16 U/L (ref 6–29)
AST: 24 U/L (ref 10–35)
Alkaline Phosphatase: 73 U/L (ref 33–130)
Bilirubin, Direct: 0.1 mg/dL (ref <=0.2)
Indirect Bilirubin: 0.4 mg/dL (ref 0.2–1.2)
TOTAL PROTEIN: 7.1 g/dL (ref 6.1–8.1)
Total Bilirubin: 0.5 mg/dL (ref 0.2–1.2)

## 2016-08-29 LAB — HEMOGLOBIN A1C
HEMOGLOBIN A1C: 5 % (ref <5.7)
MEAN PLASMA GLUCOSE: 97 mg/dL

## 2016-08-29 LAB — URIC ACID: URIC ACID, SERUM: 4 mg/dL (ref 2.5–7.0)

## 2016-08-29 LAB — TSH: TSH: 2.45 mIU/L

## 2016-08-29 LAB — VITAMIN D 25 HYDROXY (VIT D DEFICIENCY, FRACTURES): VIT D 25 HYDROXY: 92 ng/mL (ref 30–100)

## 2016-08-29 LAB — INSULIN, RANDOM: INSULIN: 5.8 u[IU]/mL (ref 2.0–19.6)

## 2016-09-01 ENCOUNTER — Other Ambulatory Visit: Payer: Self-pay | Admitting: Otolaryngology

## 2016-09-01 DIAGNOSIS — J329 Chronic sinusitis, unspecified: Secondary | ICD-10-CM

## 2016-09-08 ENCOUNTER — Ambulatory Visit
Admission: RE | Admit: 2016-09-08 | Discharge: 2016-09-08 | Disposition: A | Discharge status: Home / Self Care | Payer: 59 | Source: Ambulatory Visit | Attending: Otolaryngology | Admitting: Otolaryngology

## 2016-09-08 DIAGNOSIS — J329 Chronic sinusitis, unspecified: Secondary | ICD-10-CM

## 2016-09-16 ENCOUNTER — Encounter: Payer: Self-pay | Admitting: Internal Medicine

## 2016-09-16 ENCOUNTER — Ambulatory Visit (INDEPENDENT_AMBULATORY_CARE_PROVIDER_SITE_OTHER): Payer: 59 | Admitting: Internal Medicine

## 2016-09-16 VITALS — BP 130/82 | HR 58 | Temp 98.0°F | Ht 67.0 in | Wt 152.0 lb

## 2016-09-16 DIAGNOSIS — H1031 Unspecified acute conjunctivitis, right eye: Secondary | ICD-10-CM | POA: Diagnosis not present

## 2016-09-16 MED ORDER — CIPROFLOXACIN HCL 0.3 % OP SOLN: 2.0000 [drp] | OPHTHALMIC | 0 refills | Status: DC

## 2016-09-16 NOTE — Progress Notes (Signed)
Assessment and Plan:   1. Acute conjunctivitis of right eye, unspecified acute conjunctivitis type -vision normal -contact lens wearer -will cover with cipro opthalmic drops due to contact lenses and pseudomonas coverage -warm compresses -call opthalmologist if she develops vision changes -call eye doctors office for new trial pair of lenses -wear glasses until infection improves.   -recheck as needed.      HPI 61 y.o.female presents for right eye irritation and redness.  She reports that it has been slightly red and goopy.  She reports that she is not having watering and itching at all. She reports that she has not been exposed to pink eye that she is aware of.  She does typically wear contacts.  She reports that she went to the eye doctor last week. She is using some systane lubricating drops.  No blurry vision    Past Medical History:  Diagnosis Date  . Allergy   . Contact lens/glasses fitting    wears contacts or glasses  . GERD (gastroesophageal reflux disease)   . Hyperlipidemia   . Hypertension   . Irritable bowel syndrome (IBS)   . Peripheral neuropathy (HCC)    in feet  . PONV (postoperative nausea and vomiting)    nausea, slow to wake up at times  . Vitamin D deficiency      Allergies  Allergen Reactions  . Alprazolam Other (See Comments)     Unknown reaction per pt  . Prednisone     Only high doses  . Allopurinol Rash      Current Outpatient Prescriptions on File Prior to Visit  Medication Sig Dispense Refill  . AMITIZA 8 MCG capsule     . aspirin 81 MG tablet Take 81 mg by mouth every other day.     Marland Kitchen azelastine (ASTELIN) 0.1 % nasal spray Place 2 sprays into both nostrils 2 (two) times daily. Use in each nostril as directed 30 mL 2  . bumetanide (BUMEX) 2 MG tablet TAKE 1 TABLET TWICE A DAY FOR BLOOD PRESSURE AND FLUID 180 tablet 1  . Cholecalciferol (VITAMIN D PO) Take 2,000 Units by mouth daily.     . febuxostat (ULORIC) 40 MG tablet Take 1 tablet (40  mg total) by mouth daily. 90 tablet 1  . fluticasone (FLONASE) 50 MCG/ACT nasal spray Place 2 sprays into both nostrils daily. 16 g 4  . Magnesium 250 MG TABS Take 2 tablets by mouth 2 (two) times daily.    . Multiple Vitamin (MULTIVITAMIN) tablet Take 1 tablet by mouth daily.    . potassium chloride SA (K-DUR,KLOR-CON) 20 MEQ tablet TAKE 1 TABLET TWICE A DAY 180 tablet 1  . ranitidine (ZANTAC) 300 MG tablet TAKE 1 TABLET DAILY FOR ACID REFLUX 90 tablet 1  . topiramate (TOPAMAX) 50 MG tablet TAKE 1 TABLET BY MOUTH TWICE A DAY 60 tablet 2   No current facility-administered medications on file prior to visit.     ROS: all negative except above.   Physical Exam: Filed Weights   09/16/16 1612  Weight: 152 lb (68.9 kg)   BP 130/82   Pulse (!) 58   Temp 98 F (36.7 C) (Temporal)   Ht 5\' 7"  (1.702 m)   Wt 152 lb (68.9 kg)   BMI 23.81 kg/m  General Appearance: Well developed well nourished, non-toxic appearing in no apparent distress. Eyes: PERRLA, EOMs, Right eye with minimal right conjunctival injection.  Normal anterior chamber.  No foreign bodies visualized.  Left conjunctiva w/ no  swelling or erythema or discharge Sinuses: No Frontal/maxillary tenderness ENT/Mouth: Ear canals clear without swelling or erythema.  TM's normal bilaterally with no retractions, bulging, or loss of landmarks.   Neck: Supple, thyroid normal, no notable JVD  Respiratory: Respiratory effort normal, Clear breath sounds anteriorly and posteriorly bilaterally without rales, rhonchi, wheezing or stridor. No retractions or accessory muscle usage. Cardio: RRR with no MRGs.   Abdomen: Soft, + BS.  Non tender, no guarding, rebound, hernias, masses.  Musculoskeletal: Full ROM, 5/5 strength, normal gait.  Skin: Warm, dry without rashes  Neuro: Awake and oriented X 3, Cranial nerves intact. Normal muscle tone, no cerebellar symptoms. Sensation intact.  Psych: normal affect, Insight and Judgment appropriate.      Starlyn Skeans, PA-C 4:42 PM [facility]

## 2016-09-16 NOTE — Patient Instructions (Addendum)
Corneal Abrasion A corneal abrasion is a scratch or injury to the clear covering over the front of your eye (cornea). This can be painful. It is important to get treatment for a corneal abrasion. If this problem is not treated, it can affect your eyesight (vision). Follow these instructions at home: Medicines  Use eye drops or ointments as told by your doctor.  If you were prescribed antibiotic drops or ointment, use them as told by your doctor. Do not stop using the antibiotic even if you start to feel better.  Take over-the-counter and prescription medicines only as told by your doctor.  Do not drive or use heavy machinery while taking prescription pain medicine. General instructions  If you have an eye patch, wear it as told by your doctor. ? Do not drive or use machinery while wearing an eye patch. ? Follow instructions from your doctor about when to take off the patch.  Ask your doctor if you can use a cold, wet cloth (compress) on your eye to help with pain.  Do not rub or touch your eye. Do not wash out your eye.  Do not wear contact lenses until your doctor says that this is okay.  Avoid bright light.  Avoid straining your eyes.  Keep all follow-up visits as told by your doctor. Doing this can help to prevent infection and loss of eyesight. Contact a doctor if:  You continue to have eye pain and other symptoms for more than 2 days.  You get new symptoms, such as: ? Redness. ? Watery eyes (tearing). ? Discharge.  You have discharge that makes your eyelids stick together in the morning.  Symptoms come back after your eye heals. Get help right away if:  You have very bad eye pain that does not get better with medicine.  You lose eyesight. Summary  A corneal abrasion is a scratch or injury to the clear covering over the front of your eye (cornea).  It is important to get treatment for a corneal abrasion. If this problem is not treated, it can affect your eyesight  (vision).  Use eye drops or ointments as told by your doctor.  If you have an eye patch, do not drive or use machinery while wearing it. This information is not intended to replace advice given to you by your health care provider. Make sure you discuss any questions you have with your health care provider. Document Released: 11/26/2007 Document Revised: 05/24/2016 Document Reviewed: 05/24/2016 Elsevier Interactive Patient Education  2017 [employer]  

## 2016-09-29 ENCOUNTER — Ambulatory Visit (INDEPENDENT_AMBULATORY_CARE_PROVIDER_SITE_OTHER): Payer: 59 | Admitting: Internal Medicine

## 2016-09-29 ENCOUNTER — Encounter: Payer: Self-pay | Admitting: Internal Medicine

## 2016-09-29 VITALS — BP 108/70 | HR 64 | Temp 97.9°F | Resp 16 | Ht 67.0 in | Wt 153.4 lb

## 2016-09-29 DIAGNOSIS — F419 Anxiety disorder, unspecified: Secondary | ICD-10-CM

## 2016-09-29 DIAGNOSIS — R51 Headache: Secondary | ICD-10-CM

## 2016-09-29 DIAGNOSIS — J301 Allergic rhinitis due to pollen: Secondary | ICD-10-CM

## 2016-09-29 DIAGNOSIS — R519 Headache, unspecified: Secondary | ICD-10-CM

## 2016-09-29 MED ORDER — MONTELUKAST SODIUM 10 MG PO TABS: ORAL_TABLET | ORAL | 1 refills | Status: DC

## 2016-09-29 MED ORDER — CITALOPRAM HYDROBROMIDE 20 MG PO TABS: ORAL_TABLET | ORAL | 1 refills | Status: DC

## 2016-09-29 NOTE — Progress Notes (Signed)
Subjective:    Patient ID: Katie Cox, female    DOB: [birth year], 61 y.o.   MRN: [id number]  HPI  This nice 61 yo DWF returns for 6 week f/o for c/o chronic allergy sx's with peri-orbital HA w/o improvement after multiple courses of Antibiotics and various allergy medications.  Patient had been treated with a steroid pulse followed by a slow taper to qod then d/c and she reports minimal improvement. In the interim, she has been seen by Dr Radene Journey, ENT, and had a negative sinus CT scan. She also relates that she feels stress & anxiety may also be contributing to her HA's in that her job/department is being outsourced and terminated at [employer]. within the next year.   Medication Sig  . AMITIZA 8 MCG capsule   . aspirin 81 MG tablet Take every other day.   . ASTELIN  nasal spray 2 sprays into  nostrils 2 x daily  . bumetanide  2 MG tablet TAKE 1 TAB TWICE A DAY  . VITAMIN D Take 2,000 Units  daily.   Marland Kitchen ULORIC 40 MG tablet Take 1 tab daily.  Marland Kitchen fFLONASE  nasal spray Place 2 sprays into  nostrils daily.  . Magnesium 250 MG  Take 2 tab 2 x daily.  . Multiple Vitamin  Take 1 tab daily.  . potassium chloride 20 MEQ  TAKE 1 TAB TWICE A DAY  . ranitidine  300 MG tablet TAKE 1 TAB DAILY FOR ACID REFLUX  . topiramate  50 MG tablet TAKE 1 TAB TWICE A DAY   Allergies  Allergen Reactions  . Alprazolam Other (See Comments)     Unknown reaction per pt  . Prednisone     Only high doses  . Allopurinol Rash   Past Medical History:  Diagnosis Date  . Allergy   . Contact lens/glasses fitting    wears contacts or glasses  . GERD (gastroesophageal reflux disease)   . Hyperlipidemia   . Hypertension   . Irritable bowel syndrome (IBS)   . Peripheral neuropathy (HCC)    in feet  . PONV (postoperative nausea and vomiting)    nausea, slow to wake up at times  . Vitamin D deficiency    Review of Systems  10 point systems review negative except as above.    Objective:   Physical Exam  BP  108/70   Pulse 64   Temp 97.9 F (36.6 C)   Resp 16   Ht 5\' 7"  (1.702 m)   Wt 153 lb 6.4 oz (69.6 kg)   BMI 24.03 kg/m   NAD. Skin clear.   HEENT - Eac's patent. TM's Nl. EOM's full. PERRLA. (+) sl tender over frontal/maxil;lary sinus areas. NasoOroPharynx clear. Neck - supple. Nl Thyroid. Carotids 2+ & No bruits, nodes, JVD Chest - Clear equal BS w/o Rales, rhonchi, wheezes. Cor - Nl HS. RRR w/o sig murmur.  MS- FROM w/o deformities. Muscle power, tone and bulk Nl. Gait Nl. Neuro - No obvious Cr N abnormalities.  Nl w/o focal abnormalities. Psyche - Mental status with anxious, depressed affect.  No delusions, ideations or obvious mood abnormalities.    Assessment & Plan:   1. Nonintractable headache, unspecified chronicity pattern, unspecified headache type  - citalopram (CELEXA) 20 MG tablet; Take 1 tablet daily for Anxiety & Tension  Dispense: 90 tablet; Refill: 1  2. Chronic allergic rhinitis due to pollen, unspecified seasonality  - montelukast (SINGULAIR) 10 MG tablet; Take 1 tablet daily  for Allergies  Dispense: 90 tablet; Refill: 1  3. Anxiety tension state  - discussed importance of prudent Dash diet & exercise

## 2016-10-13 ENCOUNTER — Other Ambulatory Visit: Payer: Self-pay | Admitting: Internal Medicine

## 2016-10-27 ENCOUNTER — Other Ambulatory Visit: Payer: Self-pay | Admitting: Obstetrics and Gynecology

## 2016-10-27 DIAGNOSIS — Z1231 Encounter for screening mammogram for malignant neoplasm of breast: Secondary | ICD-10-CM

## 2016-11-11 ENCOUNTER — Ambulatory Visit: Payer: 59

## 2016-11-18 ENCOUNTER — Ambulatory Visit: Payer: 59

## 2016-11-18 ENCOUNTER — Other Ambulatory Visit: Payer: Self-pay | Admitting: *Deleted

## 2016-11-18 MED ORDER — BUMETANIDE 2 MG PO TABS: ORAL_TABLET | ORAL | 0 refills | Status: DC

## 2016-11-25 ENCOUNTER — Ambulatory Visit: Payer: 59

## 2016-12-04 ENCOUNTER — Other Ambulatory Visit: Payer: Self-pay | Admitting: *Deleted

## 2016-12-04 MED ORDER — TOPIRAMATE 50 MG PO TABS: 50.0000 mg | ORAL_TABLET | Freq: Two times a day (BID) | ORAL | 0 refills | Status: DC

## 2016-12-15 ENCOUNTER — Ambulatory Visit: Payer: Self-pay | Admitting: Internal Medicine

## 2016-12-19 ENCOUNTER — Other Ambulatory Visit: Payer: Self-pay | Admitting: Internal Medicine

## 2016-12-29 ENCOUNTER — Ambulatory Visit
Admission: RE | Admit: 2016-12-29 | Discharge: 2016-12-29 | Disposition: A | Discharge status: Home / Self Care | Payer: 59 | Source: Ambulatory Visit | Attending: Obstetrics and Gynecology | Admitting: Obstetrics and Gynecology

## 2016-12-29 DIAGNOSIS — Z1231 Encounter for screening mammogram for malignant neoplasm of breast: Secondary | ICD-10-CM

## 2016-12-30 ENCOUNTER — Other Ambulatory Visit: Payer: Self-pay | Admitting: Obstetrics and Gynecology

## 2016-12-30 DIAGNOSIS — R928 Other abnormal and inconclusive findings on diagnostic imaging of breast: Secondary | ICD-10-CM

## 2017-01-05 ENCOUNTER — Ambulatory Visit
Admission: RE | Admit: 2017-01-05 | Discharge: 2017-01-05 | Disposition: A | Discharge status: Home / Self Care | Payer: 59 | Source: Ambulatory Visit | Attending: Obstetrics and Gynecology | Admitting: Obstetrics and Gynecology

## 2017-01-05 ENCOUNTER — Encounter: Payer: Self-pay | Admitting: Internal Medicine

## 2017-01-05 ENCOUNTER — Ambulatory Visit (INDEPENDENT_AMBULATORY_CARE_PROVIDER_SITE_OTHER): Payer: 59 | Admitting: Internal Medicine

## 2017-01-05 VITALS — BP 110/64 | HR 50 | Temp 97.7°F | Resp 16 | Ht 67.0 in | Wt 145.4 lb

## 2017-01-05 DIAGNOSIS — R928 Other abnormal and inconclusive findings on diagnostic imaging of breast: Secondary | ICD-10-CM

## 2017-01-05 DIAGNOSIS — E559 Vitamin D deficiency, unspecified: Secondary | ICD-10-CM | POA: Diagnosis not present

## 2017-01-05 DIAGNOSIS — E782 Mixed hyperlipidemia: Secondary | ICD-10-CM

## 2017-01-05 DIAGNOSIS — I10 Essential (primary) hypertension: Secondary | ICD-10-CM | POA: Diagnosis not present

## 2017-01-05 DIAGNOSIS — Z79899 Other long term (current) drug therapy: Secondary | ICD-10-CM

## 2017-01-05 DIAGNOSIS — K219 Gastro-esophageal reflux disease without esophagitis: Secondary | ICD-10-CM | POA: Diagnosis not present

## 2017-01-05 DIAGNOSIS — E790 Hyperuricemia without signs of inflammatory arthritis and tophaceous disease: Secondary | ICD-10-CM | POA: Diagnosis not present

## 2017-01-05 DIAGNOSIS — R7303 Prediabetes: Secondary | ICD-10-CM

## 2017-01-05 LAB — BASIC METABOLIC PANEL WITH GFR
BUN: 26 mg/dL — ABNORMAL HIGH (ref 7–25)
CHLORIDE: 105 mmol/L (ref 98–110)
CO2: 25 mmol/L (ref 20–31)
Calcium: 9.7 mg/dL (ref 8.6–10.4)
Creat: 1.27 mg/dL — ABNORMAL HIGH (ref 0.50–0.99)
GFR, EST AFRICAN AMERICAN: 52 mL/min — AB (ref >=60)
GFR, EST NON AFRICAN AMERICAN: 45 mL/min — AB (ref >=60)
Glucose, Bld: 81 mg/dL (ref 65–99)
POTASSIUM: 3.7 mmol/L (ref 3.5–5.3)
SODIUM: 141 mmol/L (ref 135–146)

## 2017-01-05 LAB — HEPATIC FUNCTION PANEL
ALK PHOS: 71 U/L (ref 33–130)
ALT: 17 U/L (ref 6–29)
AST: 26 U/L (ref 10–35)
Albumin: 4.4 g/dL (ref 3.6–5.1)
BILIRUBIN DIRECT: 0.2 mg/dL (ref <=0.2)
BILIRUBIN TOTAL: 1 mg/dL (ref 0.2–1.2)
Indirect Bilirubin: 0.8 mg/dL (ref 0.2–1.2)
Total Protein: 7.2 g/dL (ref 6.1–8.1)

## 2017-01-05 LAB — CBC WITH DIFFERENTIAL/PLATELET
BASOS PCT: 1 %
Basophils Absolute: 95 cells/uL (ref 0–200)
EOS ABS: 95 {cells}/uL (ref 15–500)
EOS PCT: 1 %
HCT: 40.7 % (ref 35.0–45.0)
Hemoglobin: 14 g/dL (ref 11.7–15.5)
Lymphocytes Relative: 33 %
Lymphs Abs: 3135 cells/uL (ref 850–3900)
MCH: 32.7 pg (ref 27.0–33.0)
MCHC: 34.4 g/dL (ref 32.0–36.0)
MCV: 95.1 fL (ref 80.0–100.0)
MONOS PCT: 6 %
MPV: 11.6 fL (ref 7.5–12.5)
Monocytes Absolute: 570 cells/uL (ref 200–950)
NEUTROS ABS: 5605 {cells}/uL (ref 1500–7800)
Neutrophils Relative %: 59 %
PLATELETS: 364 10*3/uL (ref 140–400)
RBC: 4.28 MIL/uL (ref 3.80–5.10)
RDW: 12.7 % (ref 11.0–15.0)
WBC: 9.5 10*3/uL (ref 3.8–10.8)

## 2017-01-05 LAB — LIPID PANEL
CHOL/HDL RATIO: 2.7 ratio (ref <5.0)
CHOLESTEROL: 164 mg/dL (ref <200)
HDL: 60 mg/dL (ref >50)
LDL Cholesterol: 84 mg/dL (ref <100)
TRIGLYCERIDES: 100 mg/dL (ref <150)
VLDL: 20 mg/dL (ref <30)

## 2017-01-05 LAB — TSH: TSH: 1.17 m[IU]/L

## 2017-01-05 MED ORDER — VITAMIN D3 125 MCG (5000 UT) PO CAPS: ORAL_CAPSULE | ORAL | Status: DC

## 2017-01-05 NOTE — Patient Instructions (Signed)

## 2017-01-05 NOTE — Progress Notes (Signed)
This very nice 61 y.o. DWF presents for 3 month follow up with Hypertension, Hyperlipidemia, Pre-Diabetes and Vitamin D Deficiency. Patient has hx/o Gout which is asymptomatic and controlled.      Patient is treated for HTN (2000) & BP has been controlled at home. Today's BP is at goal  110/64. Patient has had no complaints of any cardiac type chest pain, palpitations, dyspnea/orthopnea/PND, dizziness, claudication, or dependent edema.     Hyperlipidemia is controlled with diet & meds. Patient denies myalgias or other med SE's. Last Lipids were at goal: Lab Results  Component Value Date   CHOL 173 08/28/2016   HDL 60 08/28/2016   LDLCALC 96 08/28/2016   TRIG 87 08/28/2016   CHOLHDL 2.9 08/28/2016      Also, the patient has history of a peripheral sensory neuropathy and abnormal elevated glucose and is screened expectantly for PreDiabetes and has had no symptoms of reactive hypoglycemia, diabetic polys, paresthesias or visual blurring.  Last A1c was at goal: Lab Results  Component Value Date   HGBA1C 5.0 08/28/2016      Further, the patient also has history of Vitamin D Deficiency ("26" in 2008) and supplements vitamin D without any suspected side-effects. Last vitamin D was at goal: Lab Results  Component Value Date   VD25OH 92 08/28/2016   Current Outpatient Prescriptions on File Prior to Visit  Medication Sig  . AMITIZA 8 MCG capsule   . aspirin 81 MG tablet Take 81 mg by mouth every other day.   Marland Kitchen azelastine (ASTELIN) 0.1 % nasal spray Place 2 sprays into both nostrils 2 (two) times daily. Use in each nostril as directed  . Cholecalciferol (VITAMIN D PO) Take 2,000 Units by mouth daily.   . citalopram (CELEXA) 20 MG tablet Take 1 tablet daily for Anxiety & Tension  . Magnesium 250 MG TABS Take 2 tablets by mouth 2 (two) times daily.  . montelukast (SINGULAIR) 10 MG tablet Take 1 tablet daily for Allergies  . Multiple Vitamin (MULTIVITAMIN) tablet Take 1 tablet by mouth  daily.  . potassium chloride SA (K-DUR,KLOR-CON) 20 MEQ tablet TAKE 1 TABLET TWICE A DAY  . ranitidine (ZANTAC) 300 MG tablet TAKE 1 TABLET DAILY FOR    ACID REFLUX  . topiramate (TOPAMAX) 50 MG tablet Take 1 tablet (50 mg total) by mouth 2 (two) times daily.  Marland Kitchen ULORIC 40 MG tablet TAKE 1 TABLET DAILY   No current facility-administered medications on file prior to visit.    Allergies  Allergen Reactions  . Alprazolam Other (See Comments)     Unknown reaction per pt  . Prednisone     Only high doses  . Allopurinol Rash   PMHx:   Past Medical History:  Diagnosis Date  . Allergy   . Contact lens/glasses fitting    wears contacts or glasses  . GERD (gastroesophageal reflux disease)   . Hyperlipidemia   . Hypertension   . Irritable bowel syndrome (IBS)   . Peripheral neuropathy    in feet  . PONV (postoperative nausea and vomiting)    nausea, slow to wake up at times  . Vitamin D deficiency    Immunization History  Administered Date(s) Administered  . PPD Test 01/25/2014, 02/05/2015, 02/27/2016  . Pneumococcal Polysaccharide-23 11/23/2008  . Tdap 11/29/2009   Past Surgical History:  Procedure Laterality Date  . carpel tunnel surgery Right   . CHOLECYSTECTOMY  04/14/2012   Procedure: LAPAROSCOPIC CHOLECYSTECTOMY;  Surgeon: Rolm Bookbinder, MD;  Location: [place];  Service: General;  Laterality: N/A;  . COLONOSCOPY    . EYE SURGERY     at age 28yr  . FOOT SURGERY     both feet - little toes on each foot  . goiter removed fro thyroid    . OPEN REDUCTION INTERNAL FIXATION (ORIF) DISTAL RADIAL FRACTURE Right 07/04/2015   Procedure: OPEN REDUCTION INTERNAL FIXATION (ORIF) RIGHT DISTAL RADIUS FRACTURE;  Surgeon: Iran Planas, MD;  Location: [place];  Service: Orthopedics;  Laterality: Right;  . WISDOM TOOTH EXTRACTION     FHx:    Reviewed / unchanged  SHx:    Reviewed / unchanged  Systems Review:  Constitutional: Denies fever, chills, wt changes,  headaches, insomnia, fatigue, night sweats, change in appetite. Eyes: Denies redness, blurred vision, diplopia, discharge, itchy, watery eyes.  ENT: Denies discharge, congestion, post nasal drip, epistaxis, sore throat, earache, hearing loss, dental pain, tinnitus, vertigo, sinus pain, snoring.  CV: Denies chest pain, palpitations, irregular heartbeat, syncope, dyspnea, diaphoresis, orthopnea, PND, claudication or edema. Respiratory: denies cough, dyspnea, DOE, pleurisy, hoarseness, laryngitis, wheezing.  Gastrointestinal: Denies dysphagia, odynophagia, heartburn, reflux, water brash, abdominal pain or cramps, nausea, vomiting, bloating, diarrhea, constipation, hematemesis, melena, hematochezia  or hemorrhoids. Genitourinary: Denies dysuria, frequency, urgency, nocturia, hesitancy, discharge, hematuria or flank pain. Musculoskeletal: Denies arthralgias, myalgias, stiffness, jt. swelling, pain, limping or strain/sprain.  Skin: Denies pruritus, rash, hives, warts, acne, eczema or change in skin lesion(s). Neuro: No weakness, tremor, incoordination, spasms, paresthesia or pain. Psychiatric: Denies confusion, memory loss or sensory loss. Endo: Denies change in weight, skin or hair change.  Heme/Lymph: No excessive bleeding, bruising or enlarged lymph nodes.  Physical Exam  BP 110/64   Pulse (!) 50   Temp 97.7 F (36.5 C)   Resp 16   Ht 5\' 7"  (1.702 m)   Wt 145 lb 6.4 oz (66 kg)   BMI 22.77 kg/m   Appears well nourished, well groomed  and in no distress.  Eyes: PERRLA, EOMs, conjunctiva no swelling or erythema. Sinuses: No frontal/maxillary tenderness ENT/Mouth: EAC's clear, TM's nl w/o erythema, bulging. Nares clear w/o erythema, swelling, exudates. Oropharynx clear without erythema or exudates. Oral hygiene is good. Tongue normal, non obstructing. Hearing intact.  Neck: Supple. Thyroid nl. Car 2+/2+ without bruits, nodes or JVD. Chest: Respirations nl with BS clear & equal w/o rales,  rhonchi, wheezing or stridor.  Cor: Heart sounds normal w/ regular rate and rhythm without sig. murmurs, gallops, clicks or rubs. Peripheral pulses normal and equal  without edema.  Abdomen: Soft & bowel sounds normal. Non-tender w/o guarding, rebound, hernias, masses or organomegaly.  Lymphatics: Unremarkable.  Musculoskeletal: Full ROM all peripheral extremities, joint stability, 5/5 strength and normal gait.  Skin: Warm, dry without exposed rashes, lesions or ecchymosis apparent.  Neuro: Cranial nerves intact, reflexes equal bilaterally.  Sensation decreased  Monofilament to the toes bilaterally.Tendon reflexes grossly intact.  Pysch: Alert & oriented x 3.  Insight and judgement nl & appropriate. No ideations.  Assessment and Plan:  1. Essential hypertension  - Continue medication, monitor blood pressure at home.  - Continue DASH diet. Reminder to go to the ER if any CP,  SOB, nausea, dizziness, severe HA, changes vision/speech. - CBC with Differential/Platelet - BASIC METABOLIC PANEL WITH GFR - Magnesium - TSH  2. Hyperlipidemia, mixed  - Continue diet/meds, exercise,& lifestyle modifications.  - Continue monitor periodic cholesterol/liver & renal functions  - Hepatic function panel - Lipid panel - TSH  3.  Prediabetes  - Continue diet, exercise, lifestyle modifications.  - Monitor appropriate labs.  - Hemoglobin A1c - Insulin, random  4. Vitamin D deficiency  - Continue supplementation.  - VITAMIN D 25 Hydroxy   5. Hyperuricemia  - Uric acid  6. Gastroesophageal reflux disease   7. Medication management  - CBC with Differential/Platelet - BASIC METABOLIC PANEL WITH GFR - Hepatic function panel - Magnesium - Lipid panel - TSH - Hemoglobin A1c - Insulin, random - VITAMIN D 25 Hydroxy        Discussed  regular exercise, BP monitoring, weight control to achieve/maintain BMI less than 25 and discussed med and SE's. Recommended labs to assess and monitor  clinical status with further disposition pending results of labs. Over 30 minutes of exam, counseling, chart review was performed.

## 2017-01-06 LAB — VITAMIN D 25 HYDROXY (VIT D DEFICIENCY, FRACTURES): VIT D 25 HYDROXY: 82 ng/mL (ref 30–100)

## 2017-01-06 LAB — URIC ACID: URIC ACID, SERUM: 3.7 mg/dL (ref 2.5–7.0)

## 2017-01-06 LAB — HEMOGLOBIN A1C
Hgb A1c MFr Bld: 5.1 % (ref <5.7)
Mean Plasma Glucose: 100 mg/dL

## 2017-01-06 LAB — MAGNESIUM: MAGNESIUM: 2.3 mg/dL (ref 1.5–2.5)

## 2017-01-06 LAB — INSULIN, RANDOM: INSULIN: 9.9 u[IU]/mL (ref 2.0–19.6)

## 2017-01-06 NOTE — Progress Notes (Signed)
Pt aware of lab results & voiced understanding of those results.

## 2017-01-27 ENCOUNTER — Other Ambulatory Visit: Payer: Self-pay | Admitting: Physician Assistant

## 2017-01-28 ENCOUNTER — Other Ambulatory Visit: Payer: Self-pay | Admitting: *Deleted

## 2017-01-28 MED ORDER — BUMETANIDE 2 MG PO TABS: 2.0000 mg | ORAL_TABLET | Freq: Two times a day (BID) | ORAL | 0 refills | Status: DC

## 2017-02-10 ENCOUNTER — Telehealth: Payer: Self-pay | Admitting: Physician Assistant

## 2017-02-10 ENCOUNTER — Other Ambulatory Visit: Payer: Self-pay | Admitting: Physician Assistant

## 2017-02-10 MED ORDER — NYSTATIN 100000 UNIT/ML MT SUSP: OROMUCOSAL | 0 refills | Status: DC

## 2017-02-10 MED ORDER — AZITHROMYCIN 250 MG PO TABS: ORAL_TABLET | ORAL | 1 refills | Status: AC

## 2017-02-10 MED ORDER — PREDNISONE 20 MG PO TABS: ORAL_TABLET | ORAL | 0 refills | Status: DC

## 2017-02-10 NOTE — Telephone Encounter (Signed)
Spoke with pt informing pt that meds have been sent to pharmacy. Pt agreed & hung up.

## 2017-02-10 NOTE — Telephone Encounter (Signed)
Patient called with sinus pain, sinus drainage, cough, HA, now with white bumps on throat, saw UC last week, given amoxicillin and not getting better.   Will send in nystatin wash for possible yeast/thrush in mouth Get on flonase, allergy pill, take prednisone/zpak, if not better needs OV

## 2017-02-12 ENCOUNTER — Encounter: Payer: Self-pay | Admitting: Physician Assistant

## 2017-02-12 ENCOUNTER — Ambulatory Visit (INDEPENDENT_AMBULATORY_CARE_PROVIDER_SITE_OTHER): Payer: 59 | Admitting: Physician Assistant

## 2017-02-12 VITALS — BP 130/76 | Temp 98.6°F | Resp 14 | Ht 67.0 in | Wt 153.0 lb

## 2017-02-12 DIAGNOSIS — R232 Flushing: Secondary | ICD-10-CM | POA: Diagnosis not present

## 2017-02-12 DIAGNOSIS — R197 Diarrhea, unspecified: Secondary | ICD-10-CM | POA: Diagnosis not present

## 2017-02-12 NOTE — Patient Instructions (Addendum)
Get on probiotic Continue flonase Continue mouth wash  Stop zpak and prednisone for now  Stop the magnesium for now until diarrhea is better  If you keep having diarrhea we will bring you in for lab check in 1-2 weeks

## 2017-02-12 NOTE — Progress Notes (Signed)
Subjective:    Patient ID: Katie Cox, female    DOB: [birth year], 61 y.o.   MRN: [id number]  HPI 61 y.o. WF presents with multitude of symptoms. She went to UC and given amoxicillin for cold, not feeling better, had sore throat and "spots on tongue" so given nystatin and zpak and prednisone. She has had diarrhea, flushing, high BP. Did take two prednisone and 2 of the zpak, right now she continues to have diarrhea and will have her stomach "gurgling" at night. No more diarrhea, no nausea, no GERD, no blood in stool, no fever, chills, etc.   Blood pressure 130/76, temperature 98.6 F (37 C), resp. rate 14, height 5\' 7"  (1.702 m), weight 153 lb (69.4 kg).  Medications Current Outpatient Prescriptions on File Prior to Visit  Medication Sig  . AMITIZA 8 MCG capsule   . aspirin 81 MG tablet Take 81 mg by mouth every other day.   . bumetanide (BUMEX) 2 MG tablet Take 1 tablet (2 mg total) by mouth 2 (two) times daily.  . Cholecalciferol (VITAMIN D3) 5000 units CAPS Takes 1 cap daily  . citalopram (CELEXA) 20 MG tablet Take 1 tablet daily for Anxiety & Tension  . KLOR-CON M20 20 MEQ tablet TAKE 1 TABLET TWICE A DAY  . Magnesium 250 MG TABS Take 2 tablets by mouth 2 (two) times daily.  . Multiple Vitamin (MULTIVITAMIN) tablet Take 1 tablet by mouth daily.  . ranitidine (ZANTAC) 300 MG tablet TAKE 1 TABLET DAILY FOR    ACID REFLUX  . topiramate (TOPAMAX) 50 MG tablet Take 1 tablet (50 mg total) by mouth 2 (two) times daily.  Marland Kitchen ULORIC 40 MG tablet TAKE 1 TABLET DAILY  . azithromycin (ZITHROMAX) 250 MG tablet Take 2 tablets (500 mg) on  Day 1,  followed by 1 tablet (250 mg) once daily on Days 2 through 5. (Patient not taking: Reported on 02/12/2017)  . nystatin (MYCOSTATIN) 100000 UNIT/ML suspension 5 ml four times a day, retain in mouth as long as possible (Swish and Spit).  Use for 48 hours after symptoms resolve. (Patient not taking: Reported on 02/12/2017)  . predniSONE (DELTASONE) 20 MG  tablet 2 tablets daily for 3 days, 1 tablet daily for 4 days. (Patient not taking: Reported on 02/12/2017)   No current facility-administered medications on file prior to visit.     Problem list She has POLYP, COLON; Irritable bowel syndrome; GERD (gastroesophageal reflux disease); Hyperlipidemia; Hypertension; Vitamin D deficiency; Peripheral neuropathy; Medication management; Hyperuricemia; Abnormal glucose; Fracture of right distal radius; and Encounter for general adult medical examination with abnormal findings on her problem list.  Review of Systems  Constitutional: Positive for fatigue. Negative for chills, diaphoresis and fever.  HENT: Negative.   Respiratory: Negative.  Negative for cough.   Cardiovascular: Negative.   Gastrointestinal: Positive for abdominal pain, diarrhea and nausea. Negative for abdominal distention, anal bleeding, blood in stool, constipation, rectal pain and vomiting.  Genitourinary: Negative.   Musculoskeletal: Positive for arthralgias and back pain. Negative for gait problem, joint swelling, myalgias, neck pain and neck stiffness.  Skin: Negative.   Neurological: Positive for dizziness. Negative for headaches.       Objective:   Physical Exam  Constitutional: She appears well-developed and well-nourished.  HENT:  Head: Normocephalic and atraumatic.  Right Ear: External ear normal.  Nose: Right sinus exhibits maxillary sinus tenderness. Right sinus exhibits no frontal sinus tenderness. Left sinus exhibits maxillary sinus tenderness. Left sinus exhibits no frontal sinus  tenderness.  Eyes: Conjunctivae and EOM are normal.  Neck: Normal range of motion. Neck supple.  Cardiovascular: Normal rate, regular rhythm, normal heart sounds and intact distal pulses.   Pulmonary/Chest: Effort normal and breath sounds normal. No respiratory distress. She has no wheezes.  Abdominal: Soft. Bowel sounds are normal.  Lymphadenopathy:    She has no cervical adenopathy.   Skin: Skin is warm and dry.          Assessment & Plan:  Diarrhea Improving, better today, no fever, chills, benign AB Get on probiotic, stop mag, zpak, pred Start on probiotic, flonase, mouth wash If not better will get labs and rule out Cdiff and carcinoid with flushing.

## 2017-02-17 ENCOUNTER — Telehealth: Payer: Self-pay | Admitting: Physician Assistant

## 2017-02-17 DIAGNOSIS — A09 Infectious gastroenteritis and colitis, unspecified: Secondary | ICD-10-CM

## 2017-02-17 NOTE — Telephone Encounter (Signed)
Patient still having diarrhea.  Will get stool samples on her, have her come pick them up.  Needs to follow up with GI Can get potassium at lab visit

## 2017-02-18 ENCOUNTER — Ambulatory Visit (INDEPENDENT_AMBULATORY_CARE_PROVIDER_SITE_OTHER): Payer: 59

## 2017-02-18 DIAGNOSIS — R197 Diarrhea, unspecified: Secondary | ICD-10-CM

## 2017-02-18 NOTE — Progress Notes (Signed)
PT PRESENTS FOR LAB WORK FOR : Diarrhea of infectious origin. PT HAD NO OTHER CONCERNS OR QUESTIONS.

## 2017-02-19 LAB — CBC WITH DIFFERENTIAL/PLATELET
Basophils Absolute: 120 cells/uL (ref 0–200)
Basophils Relative: 1.1 %
EOS PCT: 0.7 %
Eosinophils Absolute: 76 cells/uL (ref 15–500)
HCT: 38.1 % (ref 35.0–45.0)
HEMOGLOBIN: 13.2 g/dL (ref 11.7–15.5)
Lymphs Abs: 3684 cells/uL (ref 850–3900)
MCH: 32.7 pg (ref 27.0–33.0)
MCHC: 34.6 g/dL (ref 32.0–36.0)
MCV: 94.3 fL (ref 80.0–100.0)
MONOS PCT: 7.8 %
MPV: 12 fL (ref 7.5–12.5)
Neutro Abs: 6169 cells/uL (ref 1500–7800)
Neutrophils Relative %: 56.6 %
Platelets: 424 10*3/uL — ABNORMAL HIGH (ref 140–400)
RBC: 4.04 10*6/uL (ref 3.80–5.10)
RDW: 12 % (ref 11.0–15.0)
Total Lymphocyte: 33.8 %
WBC mixed population: 850 cells/uL (ref 200–950)
WBC: 10.9 10*3/uL — AB (ref 3.8–10.8)

## 2017-02-19 LAB — BASIC METABOLIC PANEL WITH GFR
BUN/Creatinine Ratio: 18 (calc) (ref 6–22)
BUN: 21 mg/dL (ref 7–25)
CO2: 27 mmol/L (ref 20–32)
Calcium: 9.8 mg/dL (ref 8.6–10.4)
Chloride: 103 mmol/L (ref 98–110)
Creat: 1.2 mg/dL — ABNORMAL HIGH (ref 0.50–0.99)
GFR, Est African American: 56 mL/min/{1.73_m2} — ABNORMAL LOW (ref >=60)
GFR, Est Non African American: 48 mL/min/{1.73_m2} — ABNORMAL LOW (ref >=60)
GLUCOSE: 67 mg/dL (ref 65–99)
POTASSIUM: 3.8 mmol/L (ref 3.5–5.3)
SODIUM: 142 mmol/L (ref 135–146)

## 2017-02-19 LAB — HEPATIC FUNCTION PANEL
AG Ratio: 1.5 (calc) (ref 1.0–2.5)
ALBUMIN MSPROF: 4.2 g/dL (ref 3.6–5.1)
ALKALINE PHOSPHATASE (APISO): 72 U/L (ref 33–130)
ALT: 17 U/L (ref 6–29)
AST: 22 U/L (ref 10–35)
Bilirubin, Direct: 0.1 mg/dL (ref 0.0–0.2)
Globulin: 2.8 g/dL (calc) (ref 1.9–3.7)
Indirect Bilirubin: 0.5 mg/dL (calc) (ref 0.2–1.2)
TOTAL PROTEIN: 7 g/dL (ref 6.1–8.1)
Total Bilirubin: 0.6 mg/dL (ref 0.2–1.2)

## 2017-02-25 ENCOUNTER — Other Ambulatory Visit: Payer: Self-pay

## 2017-02-25 DIAGNOSIS — A09 Infectious gastroenteritis and colitis, unspecified: Secondary | ICD-10-CM

## 2017-02-26 LAB — GASTROINTESTINAL PATHOGEN PANEL PCR
C. DIFFICILE TOX A/B, PCR: NOT DETECTED
CAMPYLOBACTER, PCR: NOT DETECTED
Cryptosporidium, PCR: NOT DETECTED
E COLI (ETEC) LT/ST, PCR: NOT DETECTED
E COLI 0157, PCR: NOT DETECTED
E coli (STEC) stx1/stx2, PCR: NOT DETECTED
GIARDIA LAMBLIA, PCR: NOT DETECTED
NOROVIRUS, PCR: NOT DETECTED
ROTAVIRUS, PCR: NOT DETECTED
SALMONELLA, PCR: NOT DETECTED
Shigella, PCR: NOT DETECTED

## 2017-03-02 ENCOUNTER — Telehealth: Payer: Self-pay | Admitting: *Deleted

## 2017-03-02 NOTE — Telephone Encounter (Signed)
Pt called still having diarrhea asking if she should be doing anything else ? shes taking a probiotic & immodium but really isn't working worried about getting dehydrated. Per [facility] pt should continue what she is doing & push fluids. Call her GI Dr also just in case this continues. If she starts to feel worse or anything pt is to go to the ER. Pt is aware-sb

## 2017-03-04 NOTE — Progress Notes (Signed)
Pt aware of lab results & voiced understanding of those results.

## 2017-03-10 ENCOUNTER — Encounter: Payer: Self-pay | Admitting: Adult Health

## 2017-03-10 ENCOUNTER — Ambulatory Visit (INDEPENDENT_AMBULATORY_CARE_PROVIDER_SITE_OTHER): Payer: 59 | Admitting: Adult Health

## 2017-03-10 VITALS — BP 114/72 | HR 62 | Temp 97.6°F | Resp 14 | Ht 67.0 in | Wt 142.0 lb

## 2017-03-10 DIAGNOSIS — R002 Palpitations: Secondary | ICD-10-CM

## 2017-03-10 DIAGNOSIS — J029 Acute pharyngitis, unspecified: Secondary | ICD-10-CM

## 2017-03-10 DIAGNOSIS — R5383 Other fatigue: Secondary | ICD-10-CM

## 2017-03-10 NOTE — Progress Notes (Signed)
Assessment and Plan: Katie Cox was seen today for acute visit.  Diagnoses and all orders for this visit:  Fatigue, unspecified type -     Sedimentation rate -     CBC with Differential/Platelet -     BASIC METABOLIC PANEL WITH GFR -     Magnesium -     EKG 12-Lead -     Epstein-Barr Virus VCA Antibody Panel -     Urinalysis, Complete (81001)  Palpitations -     EKG 12-Lead  Pharyngitis, unspecified etiology -     Epstein-Barr Virus VCA Antibody Panel   Due to vague ongoing symptoms that include severe fatigue/malaise as well as intermittent palpitations, we will check several labs today to help rule out a variety of infectious/inflammatory sources which appear have not been previously considered. Anticipate possible residual dehydration as cause for palpitations, checking labs to confirm, EKG today to help rule out arrhythmias.   Discussed with patient that on exam, antibiotics do not appear to be indicated and to hold off on taking the doxycycline. She may managed symptoms with OTC agents as well as benzonatate for cough. If she continues to experience "recurrent severe" sinusitis, may consider ENT referral.    Encouraged stress-reduction, hydration, rest.    Further disposition pending results of labs. Discussed med's effects and SE's.   Over 30 minutes of exam, counseling, chart review, and critical decision making was performed.   Future Appointments Date Time Provider [place]  04/14/2017 3:00 PM Unk Pinto, MD [facility] None    ------------------------------------------------------------------------------------------------------------------   HPI BP 114/72   Pulse 62   Temp 97.6 F (36.4 C)   Resp 14   Ht 5\' 7"  (1.702 m)   Wt 142 lb (64.4 kg)   SpO2 98%   BMI 22.24 kg/m   61 y.o.female presents c/o fatigue/malaise, intermittent sinus pressure/sore throat, feeling "dizzy" when she lies down. The patient has been seen multiple times over the past  month at this clinic as well as urgent care facilities and has been prescribed amoxicillin, augmentin, as well as doxycycline most recently 3 days ago for this issue. The patient reports experiencing severe diarrhea with the first two abx, and has not initiated the doxycycline. She was also prescribed benzonatate which she has not taken due to fear of diarrhea. Noted history of IBS- patient states she has not had issues with this recently until taking the abx, and diarrhea has mostly resolved with discontinuation of abx, and initiation of probiotic.   The patient has been taking flonase regularly, as well as saline nasal sprays for her sinus pressure. Reports she does not tolerate systemic steroids well.   The patient denies recent travel, known sick exposures in the past 6 weeks. Denies recent tick/insect bites, animals at home.    Past Medical History:  Diagnosis Date  . Allergy   . Contact lens/glasses fitting    wears contacts or glasses  . GERD (gastroesophageal reflux disease)   . Hyperlipidemia   . Hypertension   . Irritable bowel syndrome (IBS)   . Peripheral neuropathy    in feet  . PONV (postoperative nausea and vomiting)    nausea, slow to wake up at times  . Vitamin D deficiency      Allergies  Allergen Reactions  . Alprazolam Other (See Comments)     Unknown reaction per pt  . Prednisone     Only high doses  . Allopurinol Rash    Current Outpatient Prescriptions on File Prior  to Visit  Medication Sig  . AMITIZA 8 MCG capsule   . aspirin 81 MG tablet Take 81 mg by mouth every other day.   . bumetanide (BUMEX) 2 MG tablet Take 1 tablet (2 mg total) by mouth 2 (two) times daily.  . Cholecalciferol (VITAMIN D3) 5000 units CAPS Takes 1 cap daily  . citalopram (CELEXA) 20 MG tablet Take 1 tablet daily for Anxiety & Tension  . KLOR-CON M20 20 MEQ tablet TAKE 1 TABLET TWICE A DAY  . Magnesium 250 MG TABS Take 2 tablets by mouth 2 (two) times daily.  . Multiple Vitamin  (MULTIVITAMIN) tablet Take 1 tablet by mouth daily.  Marland Kitchen nystatin (MYCOSTATIN) 100000 UNIT/ML suspension 5 ml four times a day, retain in mouth as long as possible (Swish and Spit).  Use for 48 hours after symptoms resolve.  . ranitidine (ZANTAC) 300 MG tablet TAKE 1 TABLET DAILY FOR    ACID REFLUX  . topiramate (TOPAMAX) 50 MG tablet Take 1 tablet (50 mg total) by mouth 2 (two) times daily.  Marland Kitchen ULORIC 40 MG tablet TAKE 1 TABLET DAILY   No current facility-administered medications on file prior to visit.     ROS: Review of Systems  Constitutional: Positive for malaise/fatigue. Negative for chills, diaphoresis, fever and weight loss.  HENT: Negative for congestion, ear discharge, ear pain, hearing loss, sinus pain and sore throat.        Endorses sinus fullness.   Eyes: Negative for blurred vision, double vision and photophobia.  Respiratory: Negative for cough, sputum production, shortness of breath and wheezing.   Cardiovascular: Negative for chest pain, palpitations and leg swelling.  Gastrointestinal: Negative for abdominal pain, blood in stool, constipation, melena, nausea and vomiting.       Previous episodes of diarrhea, has resolved.   Genitourinary: Negative for dysuria, flank pain, frequency, hematuria and urgency.  Musculoskeletal: Negative for falls, joint pain, myalgias and neck pain.  Skin: Negative.  Negative for rash.  Neurological: Negative for dizziness, tingling, sensory change, focal weakness, weakness and headaches.  Endo/Heme/Allergies: Positive for environmental allergies.  Psychiatric/Behavioral:       Increased stress     Physical Exam:  BP 114/72   Pulse 62   Temp 97.6 F (36.4 C)   Resp 14   Ht 5\' 7"  (1.702 m)   Wt 142 lb (64.4 kg)   SpO2 98%   BMI 22.24 kg/m   General Appearance: Well nourished, in no apparent distress. Eyes: PERRLA, EOMs, conjunctiva no swelling or erythema Sinuses: No Frontal/maxillary tenderness ENT/Mouth: Ext aud canals clear,  TMs without erythema, bulging. Posterior pharynx mildly injected; no exudates noted.  Tonsils not enlarged, without exudates, mildly erythematous. Hearing normal.  Neck: Supple, thyroid normal.  Respiratory: Respiratory effort normal, BS equal bilaterally without rales, rhonchi, wheezing or stridor.  Cardio: RRR with no MRGs. Brisk peripheral pulses without edema.  Abdomen: Soft, + BS.  Non tender, no guarding, rebound, hernias, masses. Lymphatics: Non tender without lymphadenopathy.  Musculoskeletal: Full ROM, 5/5 strength, normal gait.  Skin: Warm, dry without rashes, lesions, ecchymosis.  Neuro: Normal muscle tone, no cerebellar symptoms. Sensation intact.  Psych: Awake and oriented X 3, somewhat tearful, Insight and Judgment appropriate.     [name], NP 3:39 PM [facility]

## 2017-03-10 NOTE — Patient Instructions (Addendum)
As discussed, we are checking several possible explanations for your severe fatigue and vague ongoing symptoms. We will not prescribe any medications today; you may continue to take OTC medications for allergies, mucinex for congestions, and start taking the benzonatate for cough if you continue to have this. Drink plenty of fluids, eat as your stomach tolerates- bland diet if needed, then progress to plenty of fresh fruits/vegetables, grains, lean proteins.     Fatigue Fatigue is feeling tired all of the time, a lack of energy, or a lack of motivation. Occasional or mild fatigue is often a normal response to activity or life in general. However, long-lasting (chronic) or extreme fatigue may indicate an underlying medical condition. Follow these instructions at home: Watch your fatigue for any changes. The following actions may help to lessen any discomfort you are feeling:  Talk to your health care provider about how much sleep you need each night. Try to get the required amount every night.  Take medicines only as directed by your health care provider.  Eat a healthy and nutritious diet. Ask your health care provider if you need help changing your diet.  Drink enough fluid to keep your urine clear or pale yellow.  Practice ways of relaxing, such as yoga, meditation, massage therapy, or acupuncture.  Exercise regularly.  Change situations that cause you stress. Try to keep your work and personal routine reasonable.  Do not abuse illegal drugs.  Limit alcohol intake to no more than 1 drink per day for nonpregnant women and 2 drinks per day for men. One drink equals 12 ounces of beer, 5 ounces of wine, or 1 ounces of hard liquor.  Take a multivitamin, if directed by your health care provider.  Contact a health care provider if:  Your fatigue does not get better.  You have a fever.  You have unintentional weight loss or gain.  You have headaches.  You have difficulty: ? Falling  asleep. ? Sleeping throughout the night.  You feel angry, guilty, anxious, or sad.  You are unable to have a bowel movement (constipation).  You skin is dry.  Your legs or another part of your body is swollen. Get help right away if:  You feel confused.  Your vision is blurry.  You feel faint or pass out.  You have a severe headache.  You have severe abdominal, pelvic, or back pain.  You have chest pain, shortness of breath, or an irregular or fast heartbeat.  You are unable to urinate or you urinate less than normal.  You develop abnormal bleeding, such as bleeding from the rectum, vagina, nose, lungs, or nipples.  You vomit blood.  You have thoughts about harming yourself or committing suicide.  You are worried that you might harm someone else. This information is not intended to replace advice given to you by your health care provider. Make sure you discuss any questions you have with your health care provider. Document Released: 04/06/2007 Document Revised: 11/15/2015 Document Reviewed: 10/11/2013 Elsevier Interactive Patient Education  [employer].

## 2017-03-11 LAB — CBC WITH DIFFERENTIAL/PLATELET
BASOS ABS: 103 {cells}/uL (ref 0–200)
Basophils Relative: 0.9 %
EOS ABS: 34 {cells}/uL (ref 15–500)
EOS PCT: 0.3 %
HEMATOCRIT: 40.8 % (ref 35.0–45.0)
Hemoglobin: 14.2 g/dL (ref 11.7–15.5)
LYMPHS ABS: 4024 {cells}/uL — AB (ref 850–3900)
MCH: 32.9 pg (ref 27.0–33.0)
MCHC: 34.8 g/dL (ref 32.0–36.0)
MCV: 94.4 fL (ref 80.0–100.0)
MONOS PCT: 5.3 %
MPV: 12 fL (ref 7.5–12.5)
NEUTROS PCT: 58.2 %
Neutro Abs: 6635 cells/uL (ref 1500–7800)
Platelets: 360 10*3/uL (ref 140–400)
RBC: 4.32 10*6/uL (ref 3.80–5.10)
RDW: 11.9 % (ref 11.0–15.0)
Total Lymphocyte: 35.3 %
WBC mixed population: 604 cells/uL (ref 200–950)
WBC: 11.4 10*3/uL — ABNORMAL HIGH (ref 3.8–10.8)

## 2017-03-11 LAB — BASIC METABOLIC PANEL WITH GFR
BUN / CREAT RATIO: 20 (calc) (ref 6–22)
BUN: 21 mg/dL (ref 7–25)
CO2: 29 mmol/L (ref 20–32)
CREATININE: 1.04 mg/dL — AB (ref 0.50–0.99)
Calcium: 10.1 mg/dL (ref 8.6–10.4)
Chloride: 100 mmol/L (ref 98–110)
GFR, Est African American: 67 mL/min/{1.73_m2} (ref >=60)
GFR, Est Non African American: 58 mL/min/{1.73_m2} — ABNORMAL LOW (ref >=60)
Glucose, Bld: 113 mg/dL — ABNORMAL HIGH (ref 65–99)
POTASSIUM: 3.3 mmol/L — AB (ref 3.5–5.3)
SODIUM: 139 mmol/L (ref 135–146)

## 2017-03-11 LAB — MAGNESIUM: MAGNESIUM: 2 mg/dL (ref 1.5–2.5)

## 2017-03-11 LAB — SEDIMENTATION RATE: Sed Rate: 14 mm/h (ref 0–30)

## 2017-03-11 LAB — URINALYSIS, COMPLETE
BILIRUBIN URINE: NEGATIVE
Bacteria, UA: NONE SEEN /HPF
GLUCOSE, UA: NEGATIVE
Hgb urine dipstick: NEGATIVE
Hyaline Cast: NONE SEEN /LPF
KETONES UR: NEGATIVE
Leukocytes, UA: NEGATIVE
NITRITE: NEGATIVE
PROTEIN: NEGATIVE
RBC / HPF: NONE SEEN /HPF (ref 0–2)
Specific Gravity, Urine: 1.007 (ref 1.001–1.03)
Squamous Epithelial / LPF: NONE SEEN /HPF (ref <=5)
WBC, UA: NONE SEEN /HPF (ref 0–5)
pH: 6.5 (ref 5.0–8.0)

## 2017-03-11 LAB — EPSTEIN-BARR VIRUS VCA ANTIBODY PANEL
EBV NA IgG: 402 U/mL — ABNORMAL HIGH
EBV VCA IGG: 650 U/mL — AB
EBV VCA IgM: <36 U/mL

## 2017-03-16 ENCOUNTER — Telehealth: Payer: Self-pay

## 2017-03-16 NOTE — Progress Notes (Signed)
Pt aware of lab results & voiced understanding of those results.

## 2017-03-16 NOTE — Telephone Encounter (Signed)
-----   Message from Liane Comber, NP sent at 03/16/2017  1:44 PM EDT ----- Regarding: RE: WATER QUESTIONS Yes, she needs to drink the water because she has had so much diarrhea and labs show dehydration- she can alternate water and electrolyte supplemented sports drink for a few days if she likes, and if she can't get 80 oz at least 64 oz.   As discussed double up on potassium for a few days to help get into normal range quicker, but once her diarrhea stops (which it had when I spoke to her) her body should be able to regulate her potassium much better and be able to stay in range. Increased hydration should not throw off her potassium.   ----- Message ----- From: Elenor Quinones, CMA Sent: 03/16/2017  12:35 PM To: Liane Comber, NP Subject: WATER QUESTIONS                                Pt states she has been drinking water but maybe not 80oz a day. So her question is if her potassium is low should she be drinking so much water & would this cause her potassium to be low? She reports she is on a fluid pill as well.  Please advise.

## 2017-03-16 NOTE — Progress Notes (Signed)
LVM for pt to return office call for LAB results.

## 2017-03-16 NOTE — Telephone Encounter (Signed)
Pt has been informed to increase water intake & increase potassium pill for 2 days & then back to normal.

## 2017-03-16 NOTE — Telephone Encounter (Signed)
Lvm for return call about water intake.

## 2017-03-25 ENCOUNTER — Other Ambulatory Visit: Payer: Self-pay | Admitting: Internal Medicine

## 2017-04-07 ENCOUNTER — Other Ambulatory Visit: Payer: Self-pay | Admitting: Internal Medicine

## 2017-04-14 ENCOUNTER — Ambulatory Visit (INDEPENDENT_AMBULATORY_CARE_PROVIDER_SITE_OTHER): Payer: 59 | Admitting: Internal Medicine

## 2017-04-14 ENCOUNTER — Encounter: Payer: Self-pay | Admitting: Internal Medicine

## 2017-04-14 VITALS — BP 118/72 | HR 60 | Temp 97.3°F | Resp 18 | Ht 67.0 in | Wt 137.8 lb

## 2017-04-14 DIAGNOSIS — Z136 Encounter for screening for cardiovascular disorders: Secondary | ICD-10-CM

## 2017-04-14 DIAGNOSIS — Z0000 Encounter for general adult medical examination without abnormal findings: Secondary | ICD-10-CM | POA: Diagnosis not present

## 2017-04-14 DIAGNOSIS — R7303 Prediabetes: Secondary | ICD-10-CM

## 2017-04-14 DIAGNOSIS — I10 Essential (primary) hypertension: Secondary | ICD-10-CM

## 2017-04-14 DIAGNOSIS — Z0001 Encounter for general adult medical examination with abnormal findings: Secondary | ICD-10-CM

## 2017-04-14 DIAGNOSIS — Z111 Encounter for screening for respiratory tuberculosis: Secondary | ICD-10-CM

## 2017-04-14 DIAGNOSIS — R109 Unspecified abdominal pain: Secondary | ICD-10-CM

## 2017-04-14 DIAGNOSIS — E790 Hyperuricemia without signs of inflammatory arthritis and tophaceous disease: Secondary | ICD-10-CM

## 2017-04-14 DIAGNOSIS — R5383 Other fatigue: Secondary | ICD-10-CM

## 2017-04-14 DIAGNOSIS — Z1212 Encounter for screening for malignant neoplasm of rectum: Secondary | ICD-10-CM

## 2017-04-14 DIAGNOSIS — Z1211 Encounter for screening for malignant neoplasm of colon: Secondary | ICD-10-CM

## 2017-04-14 DIAGNOSIS — E782 Mixed hyperlipidemia: Secondary | ICD-10-CM

## 2017-04-14 DIAGNOSIS — Z79899 Other long term (current) drug therapy: Secondary | ICD-10-CM

## 2017-04-14 DIAGNOSIS — J309 Allergic rhinitis, unspecified: Secondary | ICD-10-CM

## 2017-04-14 DIAGNOSIS — E559 Vitamin D deficiency, unspecified: Secondary | ICD-10-CM

## 2017-04-14 DIAGNOSIS — K219 Gastro-esophageal reflux disease without esophagitis: Secondary | ICD-10-CM

## 2017-04-14 DIAGNOSIS — K581 Irritable bowel syndrome with constipation: Secondary | ICD-10-CM

## 2017-04-14 MED ORDER — MONTELUKAST SODIUM 10 MG PO TABS: ORAL_TABLET | ORAL | 3 refills | Status: DC

## 2017-04-14 MED ORDER — FLUTICASONE PROPIONATE 50 MCG/ACT NA SUSP: 1.0000 | Freq: Every day | NASAL | Status: AC

## 2017-04-14 MED ORDER — LORATADINE 10 MG PO TABS: 10.0000 mg | ORAL_TABLET | Freq: Every day | ORAL | Status: DC | PRN

## 2017-04-14 MED ORDER — PANTOPRAZOLE SODIUM 40 MG PO TBEC: DELAYED_RELEASE_TABLET | ORAL | 3 refills | Status: DC

## 2017-04-14 NOTE — Progress Notes (Signed)
[facility] Unk Pinto, M.D.     Katie Cox. Silverio Lay, P.A.-C Liane Comber, [place] [address] [place] [address] Telephone [phone] Telefax [phone] Annual Screening/Preventative Visit & Comprehensive Evaluation &  Examination     This very nice 61 y.o. DWF presents for a Screening/Preventative Visit & comprehensive evaluation and management of multiple medical co-morbidities.  Patient has been followed for HTN, Prediabetes, Hyperlipidemia and Vitamin D Deficiency. Patient has hx/o Gout controlled on Allopurinol. She also has Hx/o IBS-C with vague abdominal pains.       HTN predates since 2000. Patient's BP has been controlled at home and patient denies any cardiac symptoms as chest pain, palpitations, shortness of breath, dizziness or ankle swelling. Today's BP is at goal - 118/72.      Patient's hyperlipidemia is controlled with diet and medications. Patient denies myalgias or other medication SE's. Last lipids were at goal: Lab Results  Component Value Date   CHOL 164 01/05/2017   HDL 60 01/05/2017   LDLCALC 84 01/05/2017   TRIG 100 01/05/2017   CHOLHDL 2.7 01/05/2017      Patient is followed expectantly for  prediabetes as she has long hx/o a peripheral sensory neuropathy and hx/o abnormally elevated glucose in the past. She denies reactive hypoglycemic symptoms, visual blurring, diabetic polys, or paresthesias. Last A1c was at goal: Lab Results  Component Value Date   HGBA1C 5.1 01/05/2017      Finally, patient has history of Vitamin D Deficiency of "26" in 2008 and last Vitamin D was at goal: Lab Results  Component Value Date   VD25OH 82 01/05/2017   Current Outpatient Prescriptions on File Prior to Visit  Medication Sig  . AMITIZA 8 MCG capsule   . aspirin 81 MG tablet Take 81 mg by mouth every other day.   . bumetanide (BUMEX) 2 MG tablet Take 1 tablet (2 mg total) by  mouth 2 (two) times daily.  . Cholecalciferol (VITAMIN D3) 5000 units CAPS Takes 1 cap daily  . KLOR-CON M20 20 MEQ tablet TAKE 1 TABLET TWICE A DAY  . Magnesium 250 MG TABS Take 2 tablets by mouth 2 (two) times daily.  . Multiple Vitamin (MULTIVITAMIN) tablet Take 1 tablet by mouth daily.  Marland Kitchen nystatin (MYCOSTATIN) 100000 UNIT/ML suspension 5 ml four times a day, retain in mouth as long as possible (Swish and Spit).  Use for 48 hours after symptoms resolve.  . ranitidine (ZANTAC) 300 MG tablet TAKE 1 TABLET DAILY FOR    ACID REFLUX  . topiramate (TOPAMAX) 50 MG tablet TAKE 1 TABLET BY MOUTH TWICE A DAY  . ULORIC 40 MG tablet TAKE 1 TABLET DAILY  . citalopram (CELEXA) 20 MG tablet Take 1 tablet daily for Anxiety & Tension   No current facility-administered medications on file prior to visit.    Allergies  Allergen Reactions  . Alprazolam Other (See Comments)     Unknown reaction per pt  . Prednisone     Only high doses  . Allopurinol Rash   Past Medical History:  Diagnosis Date  . Allergy   . Contact lens/glasses fitting    wears contacts or glasses  . GERD (gastroesophageal reflux disease)   . Hyperlipidemia   . Hypertension   . Irritable bowel syndrome (IBS)   . Peripheral neuropathy    in feet  . PONV (postoperative nausea and vomiting)    nausea, slow to wake up at times  .  Vitamin D deficiency    Health Maintenance  Topic Date Due  . INFLUENZA VACCINE  01/21/2017  . PAP SMEAR  03/30/2018  . MAMMOGRAM  12/30/2018  . COLONOSCOPY  08/05/2019  . TETANUS/TDAP  11/30/2019  . Hepatitis C Screening  Completed  . HIV Screening  Completed   Immunization History  Administered Date(s) Administered  . PPD Test 01/25/2014, 02/05/2015, 02/27/2016  . Pneumococcal Polysaccharide-23 11/23/2008  . Tdap 11/29/2009   Past Surgical History:  Procedure Laterality Date  . carpel tunnel surgery Right   . CHOLECYSTECTOMY  04/14/2012   Procedure: LAPAROSCOPIC CHOLECYSTECTOMY;   Surgeon: Rolm Bookbinder, MD;  Location: [facility];  Service: General;  Laterality: N/A;  . COLONOSCOPY    . EYE SURGERY     at age 62yr  . FOOT SURGERY     both feet - little toes on each foot  . goiter removed fro thyroid    . OPEN REDUCTION INTERNAL FIXATION (ORIF) DISTAL RADIAL FRACTURE Right 07/04/2015   Procedure: OPEN REDUCTION INTERNAL FIXATION (ORIF) RIGHT DISTAL RADIUS FRACTURE;  Surgeon: Iran Planas, MD;  Location: [facility];  Service: Orthopedics;  Laterality: Right;  . WISDOM TOOTH EXTRACTION     Family History  Problem Relation Age of Onset  . Colon cancer Father 66  . Heart disease Mother   . Breast cancer Maternal Aunt   . Esophageal cancer Neg Hx   . Rectal cancer Neg Hx   . Stomach cancer Neg Hx    Social History  Substance Use Topics  . Smoking status: Never Smoker  . Smokeless tobacco: Never Used  . Alcohol use No    ROS Constitutional: Denies fever, chills, weight loss/gain, headaches, insomnia,  night sweats, and change in appetite. Does c/o fatigue. Eyes: Denies redness, blurred vision, diplopia, discharge, itchy, watery eyes.  ENT: Denies discharge, congestion, post nasal drip, epistaxis, sore throat, earache, hearing loss, dental pain, Tinnitus, Vertigo, Sinus pain, snoring.  Cardio: Denies chest pain, palpitations, irregular heartbeat, syncope, dyspnea, diaphoresis, orthopnea, PND, claudication, edema Respiratory: denies cough, dyspnea, DOE, pleurisy, hoarseness, laryngitis, wheezing.  Gastrointestinal: Denies dysphagia, heartburn, reflux, water brash, pain, cramps, nausea, vomiting, bloating, diarrhea, constipation, hematemesis, melena, hematochezia, jaundice, hemorrhoids Genitourinary: Denies dysuria, frequency, urgency, nocturia, hesitancy, discharge, hematuria, flank pain Breast: Breast lumps, nipple discharge, bleeding.  Musculoskeletal: Denies arthralgia, myalgia, stiffness, Jt. Swelling, pain, limp, and strain/sprain. Denies  falls. Skin: Denies puritis, rash, hives, warts, acne, eczema, changing in skin lesion Neuro: No weakness, tremor, incoordination, spasms, paresthesia, pain Psychiatric: Denies confusion, memory loss, sensory loss. Denies Depression. Endocrine: Denies change in weight, skin, hair change, nocturia, and paresthesia, diabetic polys, visual blurring, hyper / hypo glycemic episodes.  Heme/Lymph: No excessive bleeding, bruising, enlarged lymph nodes.  Physical Exam  BP 118/72   Pulse 60   Temp (!) 97.3 F (36.3 C)   Resp 18   Ht 5\' 7"  (1.702 m)   Wt 137 lb 12.8 oz (62.5 kg)   BMI 21.58 kg/m   General Appearance: Well nourished, well groomed and in no apparent distress.  Eyes: PERRLA, EOMs, conjunctiva no swelling or erythema, normal fundi and vessels. Sinuses: No frontal/maxillary tenderness ENT/Mouth: EACs patent / TMs  nl. Nares clear without erythema, swelling, mucoid exudates. Oral hygiene is good. No erythema, swelling, or exudate. Tongue normal, non-obstructing. Tonsils not swollen or erythematous. Hearing normal.  Neck: Supple, thyroid normal. No bruits, nodes or JVD. Respiratory: Respiratory effort normal.  BS equal and clear bilateral without rales, rhonci, wheezing or stridor.  Cardio: Heart sounds are normal with regular rate and rhythm and no murmurs, rubs or gallops. Peripheral pulses are normal and equal bilaterally without edema. No aortic or femoral bruits. Chest: symmetric with normal excursions and percussion. Breasts: Symmetric, without lumps, nipple discharge, retractions, or fibrocystic changes.  Abdomen: Flat, soft with bowel sounds active. Nontender, no guarding, rebound, hernias, masses, or organomegaly.  Lymphatics: Non tender without lymphadenopathy.  Genitourinary:  Musculoskeletal: Full ROM all peripheral extremities, joint stability, 5/5 strength, and normal gait. Skin: Warm and dry without rashes, lesions, cyanosis, clubbing or  ecchymosis.  Neuro: Cranial  nerves intact, reflexes equal bilaterally. Normal muscle tone, no cerebellar symptoms. Sensation intact.  Pysch: Alert and oriented X 3, normal affect, Insight and Judgment appropriate.   Assessment and Plan  1. Annual Preventative Screening Examination  1. Encounter for general adult medical examination with abnormal findings   2. Essential hypertension  - EKG 12-Lead - [place], RETROPERITNL ABD,  LTD - Urinalysis, Routine w reflex microscopic - Microalbumin / creatinine urine ratio - BASIC METABOLIC PANEL WITH GFR - Hepatic function panel - Lipid panel - TSH  3. Hyperlipidemia, mixed  - EKG 12-Lead - [place], RETROPERITNL ABD,  LTD - Magnesium - TSH  4. Prediabetes  - EKG 12-Lead - [place], RETROPERITNL ABD,  LTD - Hemoglobin A1c - Insulin, random  5. Vitamin D deficiency  - VITAMIN D 25 Hydroxy   6. Irritable bowel syndrome with constipation   7. Hyperuricemia  - CBC with Differential/Platelet  8. Screening for colorectal cancer  - POC Hemoccult Bld/Stl  9. Screening for ischemic heart disease  - EKG 12-Lead  10. Screening for AAA (aortic abdominal aneurysm)  - [place], RETROPERITNL ABD,  LTD  11. Screening examination for pulmonary tuberculosis   12. Fatigue, unspecified type  - Iron,Total/Total Iron Binding Cap - Vitamin B12 - CBC with Differential/Platelet - TSH  13. Medication management  - Urinalysis, Routine w reflex microscopic - Microalbumin / creatinine urine ratio - Uric acid - CBC with Differential/Platelet - BASIC METABOLIC PANEL WITH GFR - Hepatic function panel - Magnesium - Lipid panel - TSH - Hemoglobin A1c - Insulin, random - VITAMIN D 25 Hydroxy   14. Abdominal pain, unspecified abdominal location       Patient was counseled in prudent diet to achieve/maintain BMI less than 25 for weight control, BP monitoring, regular exercise and medications. Discussed med's effects and SE's. Screening labs and tests as requested with regular  follow-up as recommended. Over 40 minutes of exam, counseling, chart review and high complex critical decision making was performed.

## 2017-04-14 NOTE — Patient Instructions (Signed)
Preventive Care for Adults  A healthy lifestyle and preventive care can promote health and wellness. Preventive health guidelines for women include the following key practices.  A routine yearly physical is a good way to check with your health care provider about your health and preventive screening. It is a chance to share any concerns and updates on your health and to receive a thorough exam.  Visit your dentist for a routine exam and preventive care every 6 months. Brush your teeth twice a day and floss once a day. Good oral hygiene prevents tooth decay and gum disease.  The frequency of eye exams is based on your age, health, family medical history, use of contact lenses, and other factors. Follow your health care provider's recommendations for frequency of eye exams.  Eat a healthy diet. Foods like vegetables, fruits, whole grains, low-fat dairy products, and lean protein foods contain the nutrients you need without too many calories. Decrease your intake of foods high in solid fats, added sugars, and salt. Eat the right amount of calories for you.Get information about a proper diet from your health care provider, if necessary.  Regular physical exercise is one of the most important things you can do for your health. Most adults should get at least 150 minutes of moderate-intensity exercise (any activity that increases your heart rate and causes you to sweat) each week. In addition, most adults need muscle-strengthening exercises on 2 or more days a week.  Maintain a healthy weight. The body mass index (BMI) is a screening tool to identify possible weight problems. It provides an estimate of body fat based on height and weight. Your health care provider can find your BMI and can help you achieve or maintain a healthy weight.For adults 20 years and older:  A BMI below 18.5 is considered underweight.  A BMI of 18.5 to 24.9 is normal.  A BMI of 25 to 29.9 is considered overweight.  A BMI of  30 and above is considered obese.  Maintain normal blood lipids and cholesterol levels by exercising and minimizing your intake of saturated fat. Eat a balanced diet with plenty of fruit and vegetables. Blood tests for lipids and cholesterol should begin at age 20 and be repeated every 5 years. If your lipid or cholesterol levels are high, you are over 50, or you are at high risk for heart disease, you may need your cholesterol levels checked more frequently.Ongoing high lipid and cholesterol levels should be treated with medicines if diet and exercise are not working.  If you smoke, find out from your health care provider how to quit. If you do not use tobacco, do not start.  Lung cancer screening is recommended for adults aged 55-80 years who are at high risk for developing lung cancer because of a history of smoking. A yearly low-dose CT scan of the lungs is recommended for people who have at least a 30-pack-year history of smoking and are a current smoker or have quit within the past 15 years. A pack year of smoking is smoking an average of 1 pack of cigarettes a day for 1 year (for example: 1 pack a day for 30 years or 2 packs a day for 15 years). Yearly screening should continue until the smoker has stopped smoking for at least 15 years. Yearly screening should be stopped for people who develop a health problem that would prevent them from having lung cancer treatment.  High blood pressure causes heart disease and increases the risk of   stroke. Your blood pressure should be checked at least every 1 to 2 years. Ongoing high blood pressure should be treated with medicines if weight loss and exercise do not work.  If you are 55-79 years old, ask your health care provider if you should take aspirin to prevent strokes.  Diabetes screening involves taking a blood sample to check your fasting blood sugar level. This should be done once every 3 years, after age 45, if you are within normal weight and  without risk factors for diabetes. Testing should be considered at a younger age or be carried out more frequently if you are overweight and have at least 1 risk factor for diabetes.  Breast cancer screening is essential preventive care for women. You should practice "breast self-awareness." This means understanding the normal appearance and feel of your breasts and may include breast self-examination. Any changes detected, no matter how small, should be reported to a health care provider. Women in their 20s and 30s should have a clinical breast exam (CBE) by a health care provider as part of a regular health exam every 1 to 3 years. After age 40, women should have a CBE every year. Starting at age 40, women should consider having a mammogram (breast X-ray test) every year. Women who have a family history of breast cancer should talk to their health care provider about genetic screening. Women at a high risk of breast cancer should talk to their health care providers about having an MRI and a mammogram every year.  Breast cancer gene (BRCA)-related cancer risk assessment is recommended for women who have family members with BRCA-related cancers. BRCA-related cancers include breast, ovarian, tubal, and peritoneal cancers. Having family members with these cancers may be associated with an increased risk for harmful changes (mutations) in the breast cancer genes BRCA1 and BRCA2. Results of the assessment will determine the need for genetic counseling and BRCA1 and BRCA2 testing.  Routine pelvic exams to screen for cancer are no longer recommended for nonpregnant women who are considered low risk for cancer of the pelvic organs (ovaries, uterus, and vagina) and who do not have symptoms. Ask your health care provider if a screening pelvic exam is right for you.  If you have had past treatment for cervical cancer or a condition that could lead to cancer, you need Pap tests and screening for cancer for at least 20  years after your treatment. If Pap tests have been discontinued, your risk factors (such as having a new sexual partner) need to be reassessed to determine if screening should be resumed. Some women have medical problems that increase the chance of getting cervical cancer. In these cases, your health care provider may recommend more frequent screening and Pap tests.  Colorectal cancer can be detected and often prevented. Most routine colorectal cancer screening begins at the age of 50 years and continues through age 75 years. However, your health care provider may recommend screening at an earlier age if you have risk factors for colon cancer. On a yearly basis, your health care provider may provide home test kits to check for hidden blood in the stool. Use of a small camera at the end of a tube, to directly examine the colon (sigmoidoscopy or colonoscopy), can detect the earliest forms of colorectal cancer. Talk to your health care provider about this at age 50, when routine screening begins. Direct exam of the colon should be repeated every 5-10 years through age 75 years, unless early forms of pre-cancerous   polyps or small growths are found.  Hepatitis C blood testing is recommended for all people born from 1945 through 1965 and any individual with known risks for hepatitis C.  Pra  Osteoporosis is a disease in which the bones lose minerals and strength with aging. This can result in serious bone fractures or breaks. The risk of osteoporosis can be identified using a bone density scan. Women ages 65 years and over and women at risk for fractures or osteoporosis should discuss screening with their health care providers. Ask your health care provider whether you should take a calcium supplement or vitamin D to reduce the rate of osteoporosis.  Menopause can be associated with physical symptoms and risks. Hormone replacement therapy is available to decrease symptoms and risks. You should talk to your  health care provider about whether hormone replacement therapy is right for you.  Use sunscreen. Apply sunscreen liberally and repeatedly throughout the day. You should seek shade when your shadow is shorter than you. Protect yourself by wearing long sleeves, pants, a wide-brimmed hat, and sunglasses year round, whenever you are outdoors.  Once a month, do a whole body skin exam, using a mirror to look at the skin on your back. Tell your health care provider of new moles, moles that have irregular borders, moles that are larger than a pencil eraser, or moles that have changed in shape or color.  Stay current with required vaccines (immunizations).  Influenza vaccine. All adults should be immunized every year.  Tetanus, diphtheria, and acellular pertussis (Td, Tdap) vaccine. Pregnant women should receive 1 dose of Tdap vaccine during each pregnancy. The dose should be obtained regardless of the length of time since the last dose. Immunization is preferred during the 27th-36th week of gestation. An adult who has not previously received Tdap or who does not know her vaccine status should receive 1 dose of Tdap. This initial dose should be followed by tetanus and diphtheria toxoids (Td) booster doses every 10 years. Adults with an unknown or incomplete history of completing a 3-dose immunization series with Td-containing vaccines should begin or complete a primary immunization series including a Tdap dose. Adults should receive a Td booster every 10 years.  Varicella vaccine. An adult without evidence of immunity to varicella should receive 2 doses or a second dose if she has previously received 1 dose. Pregnant females who do not have evidence of immunity should receive the first dose after pregnancy. This first dose should be obtained before leaving the health care facility. The second dose should be obtained 4-8 weeks after the first dose.  Human papillomavirus (HPV) vaccine. Females aged 13-26 years  who have not received the vaccine previously should obtain the 3-dose series. The vaccine is not recommended for use in pregnant females. However, pregnancy testing is not needed before receiving a dose. If a female is found to be pregnant after receiving a dose, no treatment is needed. In that case, the remaining doses should be delayed until after the pregnancy. Immunization is recommended for any person with an immunocompromised condition through the age of 26 years if she did not get any or all doses earlier. During the 3-dose series, the second dose should be obtained 4-8 weeks after the first dose. The third dose should be obtained 24 weeks after the first dose and 16 weeks after the second dose.  Zoster vaccine. One dose is recommended for adults aged 60 years or older unless certain conditions are present.  Measles, mumps, and rubella (  MMR) vaccine. Adults born before 2 generally are considered immune to measles and mumps. Adults born in [birth year] or later should have 1 or more doses of MMR vaccine unless there is a contraindication to the vaccine or there is laboratory evidence of immunity to each of the three diseases. A routine second dose of MMR vaccine should be obtained at least 28 days after the first dose for students attending postsecondary schools, health care workers, or international travelers. People who received inactivated measles vaccine or an unknown type of measles vaccine during 1963-1967 should receive 2 doses of MMR vaccine. People who received inactivated mumps vaccine or an unknown type of mumps vaccine before 1979 and are at high risk for mumps infection should consider immunization with 2 doses of MMR vaccine. For females of childbearing age, rubella immunity should be determined. If there is no evidence of immunity, females who are not pregnant should be vaccinated. If there is no evidence of immunity, females who are pregnant should delay immunization until after pregnancy.  Unvaccinated health care workers born before [birth year] who lack laboratory evidence of measles, mumps, or rubella immunity or laboratory confirmation of disease should consider measles and mumps immunization with 2 doses of MMR vaccine or rubella immunization with 1 dose of MMR vaccine.  Pneumococcal 13-valent conjugate (PCV13) vaccine. When indicated, a person who is uncertain of her immunization history and has no record of immunization should receive the PCV13 vaccine. An adult aged 32 years or older who has certain medical conditions and has not been previously immunized should receive 1 dose of PCV13 vaccine. This PCV13 should be followed with a dose of pneumococcal polysaccharide (PPSV23) vaccine. The PPSV23 vaccine dose should be obtained at least 8 weeks after the dose of PCV13 vaccine. An adult aged 12 years or older who has certain medical conditions and previously received 1 or more doses of PPSV23 vaccine should receive 1 dose of PCV13. The PCV13 vaccine dose should be obtained 1 or more years after the last PPSV23 vaccine dose.    Pneumococcal polysaccharide (PPSV23) vaccine. When PCV13 is also indicated, PCV13 should be obtained first. All adults aged 33 years and older should be immunized. An adult younger than age 67 years who has certain medical conditions should be immunized. Any person who resides in a nursing home or long-term care facility should be immunized. An adult smoker should be immunized. People with an immunocompromised condition and certain other conditions should receive both PCV13 and PPSV23 vaccines. People with human immunodeficiency virus (HIV) infection should be immunized as soon as possible after diagnosis. Immunization during chemotherapy or radiation therapy should be avoided. Routine use of PPSV23 vaccine is not recommended for American Indians, [place] Natives, or people younger than 65 years unless there are medical conditions that require PPSV23 vaccine. When indicated,  people who have unknown immunization and have no record of immunization should receive PPSV23 vaccine. One-time revaccination 5 years after the first dose of PPSV23 is recommended for people aged 19-64 years who have chronic kidney failure, nephrotic syndrome, asplenia, or immunocompromised conditions. People who received 1-2 doses of PPSV23 before age 40 years should receive another dose of PPSV23 vaccine at age 70 years or later if at least 5 years have passed since the previous dose. Doses of PPSV23 are not needed for people immunized with PPSV23 at or after age 26 years.  Preventive Services / Frequency   Ages 7 to 30 years  Blood pressure check.  Lipid and cholesterol check.  Lung  cancer screening. / Every year if you are aged 29-80 years and have a 30-pack-year history of smoking and currently smoke or have quit within the past 15 years. Yearly screening is stopped once you have quit smoking for at least 15 years or develop a health problem that would prevent you from having lung cancer treatment.  Clinical breast exam.** / Every year after age 27 years.  BRCA-related cancer risk assessment.** / For women who have family members with a BRCA-related cancer (breast, ovarian, tubal, or peritoneal cancers).  Mammogram.** / Every year beginning at age 32 years and continuing for as long as you are in good health. Consult with your health care provider.  Pap test.** / Every 3 years starting at age 66 years through age 30 or 2 years with a history of 3 consecutive normal Pap tests.  HPV screening.** / Every 3 years from ages 66 years through ages 33 to 35 years with a history of 3 consecutive normal Pap tests.  Fecal occult blood test (FOBT) of stool. / Every year beginning at age 27 years and continuing until age 26 years. You may not need to do this test if you get a colonoscopy every 10 years.  Flexible sigmoidoscopy or colonoscopy.** / Every 5 years for a flexible sigmoidoscopy or  every 10 years for a colonoscopy beginning at age 62 years and continuing until age 13 years.  Hepatitis C blood test.** / For all people born from [birth year] through 1965 and any individual with known risks for hepatitis C.  Skin self-exam. / Monthly.  Influenza vaccine. / Every year.  Tetanus, diphtheria, and acellular pertussis (Tdap/Td) vaccine.** / Consult your health care provider. Pregnant women should receive 1 dose of Tdap vaccine during each pregnancy. 1 dose of Td every 10 years.  Varicella vaccine.** / Consult your health care provider. Pregnant females who do not have evidence of immunity should receive the first dose after pregnancy.  Zoster vaccine.** / 1 dose for adults aged 89 years or older.  Pneumococcal 13-valent conjugate (PCV13) vaccine.** / Consult your health care provider.  Pneumococcal polysaccharide (PPSV23) vaccine.** / 1 to 2 doses if you smoke cigarettes or if you have certain conditions.  Meningococcal vaccine.** / Consult your health care provider.  Hepatitis A vaccine.** / Consult your health care provider.  Hepatitis B vaccine.** / Consult your health care provider. Screening for abdominal aortic aneurysm (AAA)  by ultrasound is recommended for people over 50 who have history of high blood pressure or who are current or former smokers. ++++++++++++++++++ Recommend Adult Low Dose Aspirin or  coated  Aspirin 81 mg daily  To reduce risk of Colon Cancer 20 %,  Skin Cancer 26 % ,  Melanoma 46%  and  Pancreatic cancer 60% +++++++++++++++++++ Vitamin D goal  is between 70-100.  Please make sure that you are taking your Vitamin D as directed.  It is very important as a natural anti-inflammatory  helping hair, skin, and nails, as well as reducing stroke and heart attack risk.  It helps your bones and helps with mood. It also decreases numerous cancer risks so please take it as directed.  Low Vit D is associated with a 200-300% higher risk for CANCER  and  200-300% higher risk for HEART   ATTACK  &  STROKE.   .....................................Marland Kitchen It is also associated with higher death rate at younger ages,  autoimmune diseases like Rheumatoid arthritis, Lupus, Multiple Sclerosis.    Also many other serious conditions, like depression,  Alzheimer's Dementia, infertility, muscle aches, fatigue, fibromyalgia - just to name a few. ++++++++++++++++++ Recommend the book "The END of DIETING" by Dr Excell Seltzer  & the book "The END of DIABETES " by Dr Excell Seltzer At [email or web address] - get book & Audio CD's    Being diabetic has a  300% increased risk for heart attack, stroke, cancer, and alzheimer- type vascular dementia. It is very important that you work harder with diet by avoiding all foods that are white. Avoid white rice (brown & wild rice is OK), white potatoes (sweetpotatoes in moderation is OK), White bread or wheat bread or anything made out of white flour like bagels, donuts, rolls, buns, biscuits, cakes, pastries, cookies, pizza crust, and pasta (made from white flour & egg whites) - vegetarian pasta or spinach or wheat pasta is OK. Multigrain breads like Arnold's or Pepper[employer], or multigrain sandwich thins or flatbreads.  Diet, exercise and weight loss can reverse and cure diabetes in the early stages.  Diet, exercise and weight loss is very important in the control and prevention of complications of diabetes which affects every system in your body, ie. Brain - dementia/stroke, eyes - glaucoma/blindness, heart - heart attack/heart failure, kidneys - dialysis, stomach - gastric paralysis, intestines - malabsorption, nerves - severe painful neuritis, circulation - gangrene & loss of a leg(s), and finally cancer and Alzheimers.    I recommend avoid fried & greasy foods,  sweets/candy, white rice (brown or wild rice or Quinoa is OK), white potatoes (sweet potatoes are OK) - anything made from white flour - bagels, doughnuts, rolls, buns, biscuits,white  and wheat breads, pizza crust and traditional pasta made of white flour & egg white(vegetarian pasta or spinach or wheat pasta is OK).  Multi-grain bread is OK - like multi-grain flat bread or sandwich thins. Avoid alcohol in excess. Exercise is also important.    Eat all the vegetables you want - avoid meat, especially red meat and dairy - especially cheese.  Cheese is the most concentrated form of trans-fats which is the worst thing to clog up our arteries. Veggie cheese is OK which can be found in the fresh produce section at [employer] or [employer] or [employer]  ++++++++++++++++++++++ DASH Eating Plan  DASH stands for "Dietary Approaches to Stop Hypertension."   The DASH eating plan is a healthy eating plan that has been shown to reduce high blood pressure (hypertension). Additional health benefits may include reducing the risk of type 2 diabetes mellitus, heart disease, and stroke. The DASH eating plan may also help with weight loss. WHAT DO I NEED TO KNOW ABOUT THE DASH EATING PLAN? For the DASH eating plan, you will follow these general guidelines:  Choose foods with a percent daily value for sodium of less than 5% (as listed on the food label).  Use salt-free seasonings or herbs instead of table salt or sea salt.  Check with your health care provider or pharmacist before using salt substitutes.  Eat lower-sodium products, often labeled as "lower sodium" or "no salt added."  Eat fresh foods.  Eat more vegetables, fruits, and low-fat dairy products.  Choose whole grains. Look for the word "whole" as the first word in the ingredient list.  Choose fish   Limit sweets, desserts, sugars, and sugary drinks.  Choose heart-healthy fats.  Eat veggie cheese   Eat more home-cooked food and less restaurant, buffet, and fast food.  Limit fried foods.  Cook foods using methods other than frying.  Limit canned  vegetables. If you do use them, rinse them well to decrease the  sodium.  When eating at a restaurant, ask that your food be prepared with less salt, or no salt if possible.                      WHAT FOODS CAN I EAT? Read Dr Fara Olden Fuhrman's books on The End of Dieting & The End of Diabetes  Grains Whole grain or whole wheat bread. Brown rice. Whole grain or whole wheat pasta. Quinoa, bulgur, and whole grain cereals. Low-sodium cereals. Corn or whole wheat flour tortillas. Whole grain cornbread. Whole grain crackers. Low-sodium crackers.  Vegetables Fresh or frozen vegetables (raw, steamed, roasted, or grilled). Low-sodium or reduced-sodium tomato and vegetable juices. Low-sodium or reduced-sodium tomato sauce and paste. Low-sodium or reduced-sodium canned vegetables.   Fruits All fresh, canned (in natural juice), or frozen fruits.  Protein Products  All fish and seafood.  Dried beans, peas, or lentils. Unsalted nuts and seeds. Unsalted canned beans.  Dairy Low-fat dairy products, such as skim or 1% milk, 2% or reduced-fat cheeses, low-fat ricotta or cottage cheese, or plain low-fat yogurt. Low-sodium or reduced-sodium cheeses.  Fats and Oils Tub margarines without trans fats. Light or reduced-fat mayonnaise and salad dressings (reduced sodium). Avocado. Safflower, olive, or canola oils. Natural peanut or almond butter.  Other Unsalted popcorn and pretzels. The items listed above may not be a complete list of recommended foods or beverages. Contact your dietitian for more options.  ++++++++++++++++++  WHAT FOODS ARE NOT RECOMMENDED? Grains/ White flour or wheat flour White bread. White pasta. White rice. Refined cornbread. Bagels and croissants. Crackers that contain trans fat.  Vegetables  Creamed or fried vegetables. Vegetables in a . Regular canned vegetables. Regular canned tomato sauce and paste. Regular tomato and vegetable juices.  Fruits Dried fruits. Canned fruit in light or heavy syrup. Fruit juice.  Meat and Other Protein  Products Meat in general - RED meat & White meat.  Fatty cuts of meat. Ribs, chicken wings, all processed meats as bacon, sausage, bologna, salami, fatback, hot dogs, bratwurst and packaged luncheon meats.  Dairy Whole or 2% milk, cream, half-and-half, and cream cheese. Whole-fat or sweetened yogurt. Full-fat cheeses or blue cheese. Non-dairy creamers and whipped toppings. Processed cheese, cheese spreads, or cheese curds.  Condiments Onion and garlic salt, seasoned salt, table salt, and sea salt. Canned and packaged gravies. Worcestershire sauce. Tartar sauce. Barbecue sauce. Teriyaki sauce. Soy sauce, including reduced sodium. Steak sauce. Fish sauce. Oyster sauce. Cocktail sauce. Horseradish. Ketchup and mustard. Meat flavorings and tenderizers. Bouillon cubes. Hot sauce. Tabasco sauce. Marinades. Taco seasonings. Relishes.  Fats and Oils Butter, stick margarine, lard, shortening and bacon fat. Coconut, palm kernel, or palm oils. Regular salad dressings.  Pickles and olives. Salted popcorn and pretzels.  The items listed above may not be a complete list of foods and beverages to avoid.

## 2017-04-15 LAB — CBC WITH DIFFERENTIAL/PLATELET
Basophils Absolute: 114 cells/uL (ref 0–200)
Basophils Relative: 1.1 %
EOS PCT: 1.1 %
Eosinophils Absolute: 114 cells/uL (ref 15–500)
HCT: 41.1 % (ref 35.0–45.0)
Hemoglobin: 14.7 g/dL (ref 11.7–15.5)
Lymphs Abs: 3806 cells/uL (ref 850–3900)
MCH: 33.4 pg — ABNORMAL HIGH (ref 27.0–33.0)
MCHC: 35.8 g/dL (ref 32.0–36.0)
MCV: 93.4 fL (ref 80.0–100.0)
MPV: 11.4 fL (ref 7.5–12.5)
Monocytes Relative: 7.5 %
NEUTROS PCT: 53.7 %
Neutro Abs: 5585 cells/uL (ref 1500–7800)
PLATELETS: 387 10*3/uL (ref 140–400)
RBC: 4.4 10*6/uL (ref 3.80–5.10)
RDW: 11.5 % (ref 11.0–15.0)
TOTAL LYMPHOCYTE: 36.6 %
WBC mixed population: 780 cells/uL (ref 200–950)
WBC: 10.4 10*3/uL (ref 3.8–10.8)

## 2017-04-15 LAB — IRON, TOTAL/TOTAL IRON BINDING CAP
%SAT: 29 % (ref 11–50)
Iron: 117 ug/dL (ref 45–160)
TIBC: 410 ug/dL (ref 250–450)

## 2017-04-15 LAB — URINALYSIS, ROUTINE W REFLEX MICROSCOPIC
BACTERIA UA: NONE SEEN /HPF
BILIRUBIN URINE: NEGATIVE
GLUCOSE, UA: NEGATIVE
HYALINE CAST: NONE SEEN /LPF
Hgb urine dipstick: NEGATIVE
Ketones, ur: NEGATIVE
Nitrite: NEGATIVE
PROTEIN: NEGATIVE
RBC / HPF: NONE SEEN /HPF (ref 0–2)
SPECIFIC GRAVITY, URINE: 1.011 (ref 1.001–1.03)
Squamous Epithelial / LPF: NONE SEEN /HPF (ref <=5)
pH: 6.5 (ref 5.0–8.0)

## 2017-04-15 LAB — HEPATIC FUNCTION PANEL
AG Ratio: 1.5 (calc) (ref 1.0–2.5)
ALT: 19 U/L (ref 6–29)
AST: 28 U/L (ref 10–35)
Albumin: 4.5 g/dL (ref 3.6–5.1)
Alkaline phosphatase (APISO): 68 U/L (ref 33–130)
BILIRUBIN INDIRECT: 0.7 mg/dL (ref 0.2–1.2)
Bilirubin, Direct: 0.2 mg/dL (ref 0.0–0.2)
GLOBULIN: 3.1 g/dL (ref 1.9–3.7)
TOTAL PROTEIN: 7.6 g/dL (ref 6.1–8.1)
Total Bilirubin: 0.9 mg/dL (ref 0.2–1.2)

## 2017-04-15 LAB — VITAMIN B12: VITAMIN B 12: 807 pg/mL (ref 200–1100)

## 2017-04-15 LAB — BASIC METABOLIC PANEL WITH GFR
BUN / CREAT RATIO: 19 (calc) (ref 6–22)
BUN: 23 mg/dL (ref 7–25)
CHLORIDE: 101 mmol/L (ref 98–110)
CO2: 31 mmol/L (ref 20–32)
CREATININE: 1.21 mg/dL — AB (ref 0.50–0.99)
Calcium: 10.5 mg/dL — ABNORMAL HIGH (ref 8.6–10.4)
GFR, Est African American: 56 mL/min/{1.73_m2} — ABNORMAL LOW (ref >=60)
GFR, Est Non African American: 48 mL/min/{1.73_m2} — ABNORMAL LOW (ref >=60)
GLUCOSE: 78 mg/dL (ref 65–99)
POTASSIUM: 3.6 mmol/L (ref 3.5–5.3)
Sodium: 142 mmol/L (ref 135–146)

## 2017-04-15 LAB — HEMOGLOBIN A1C
EAG (MMOL/L): 4.9 (calc)
HEMOGLOBIN A1C: 4.7 %{Hb} (ref <5.7)
MEAN PLASMA GLUCOSE: 88 (calc)

## 2017-04-15 LAB — MICROALBUMIN / CREATININE URINE RATIO
Creatinine, Urine: 73 mg/dL (ref 20–275)
Microalb Creat Ratio: 11 mcg/mg creat (ref <30)
Microalb, Ur: 0.8 mg/dL

## 2017-04-15 LAB — TSH: TSH: 1.33 m[IU]/L (ref 0.40–4.50)

## 2017-04-15 LAB — MAGNESIUM: MAGNESIUM: 2.3 mg/dL (ref 1.5–2.5)

## 2017-04-15 LAB — LIPID PANEL
CHOLESTEROL: 192 mg/dL (ref <200)
HDL: 72 mg/dL (ref >50)
LDL CHOLESTEROL (CALC): 97 mg/dL
Non-HDL Cholesterol (Calc): 120 mg/dL (calc) (ref <130)
Total CHOL/HDL Ratio: 2.7 (calc) (ref <5.0)
Triglycerides: 130 mg/dL (ref <150)

## 2017-04-15 LAB — VITAMIN D 25 HYDROXY (VIT D DEFICIENCY, FRACTURES): VIT D 25 HYDROXY: 86 ng/mL (ref 30–100)

## 2017-04-15 LAB — INSULIN, RANDOM: INSULIN: 10.6 u[IU]/mL (ref 2.0–19.6)

## 2017-04-15 LAB — URIC ACID: URIC ACID, SERUM: 4.1 mg/dL (ref 2.5–7.0)

## 2017-04-28 ENCOUNTER — Other Ambulatory Visit: Payer: Self-pay | Admitting: Internal Medicine

## 2017-06-22 ENCOUNTER — Other Ambulatory Visit: Payer: Self-pay | Admitting: Internal Medicine

## 2017-06-25 ENCOUNTER — Other Ambulatory Visit: Payer: Self-pay | Admitting: *Deleted

## 2017-06-25 ENCOUNTER — Other Ambulatory Visit: Payer: Self-pay

## 2017-06-25 DIAGNOSIS — K219 Gastro-esophageal reflux disease without esophagitis: Secondary | ICD-10-CM

## 2017-06-25 MED ORDER — POTASSIUM CHLORIDE CRYS ER 20 MEQ PO TBCR: 20.0000 meq | EXTENDED_RELEASE_TABLET | Freq: Two times a day (BID) | ORAL | 1 refills | Status: DC

## 2017-06-25 MED ORDER — BUMETANIDE 2 MG PO TABS: 2.0000 mg | ORAL_TABLET | Freq: Two times a day (BID) | ORAL | 1 refills | Status: DC

## 2017-06-25 MED ORDER — TOPIRAMATE 50 MG PO TABS: 50.0000 mg | ORAL_TABLET | Freq: Two times a day (BID) | ORAL | 1 refills | Status: DC

## 2017-06-25 MED ORDER — PANTOPRAZOLE SODIUM 40 MG PO TBEC: DELAYED_RELEASE_TABLET | ORAL | 0 refills | Status: DC

## 2017-06-28 ENCOUNTER — Other Ambulatory Visit: Payer: Self-pay | Admitting: Internal Medicine

## 2017-07-15 NOTE — Progress Notes (Signed)
FOLLOW UP  Assessment and Plan:   Hypertension Well controlled with current medications  Monitor blood pressure at home; patient to call if consistently greater than 130/80 Continue DASH diet.   Reminder to go to the ER if any CP, SOB, nausea, dizziness, severe HA, changes vision/speech, left arm numbness and tingling and jaw pain.  Cholesterol Currently at goal off of medications Continue low cholesterol diet and exercise.  Check lipid panel.   History of abnormal glucose Continue diet and exercise.  Perform daily foot/skin check, notify office of any concerning changes.  Defer A1C; annual check sufficient; check BMP  Vitamin D Def At goal at last visit; continue supplementation to maintain goal of 70-100 Defer Vit D level  GERD Well managed on current medications Discussed diet, avoiding triggers and other lifestyle changes  IBS with both diarrhea and constipation If not on benefiber then add it, decrease stress,  if any worsening symptoms, blood in stool, AB pain, etc call office FODMAP information provided; patient to keep food journal  Continue diet and meds as discussed. Further disposition pending results of labs. Discussed med's effects and SE's.   Over 30 minutes of exam, counseling, chart review, and critical decision making was performed.   Future Appointments  Date Time Provider [place]  10/22/2017  2:30 PM Unk Pinto, MD [facility] None  05/26/2018  3:00 PM Unk Pinto, MD [facility] None    ----------------------------------------------------------------------------------------------------------------------  HPI 62 y.o. female  presents for 3 month follow up on hypertension, cholesterol, history of abnormal glucose, and vitamin D deficiency.  She also has ongoing issues with GI, + hx with GERD, IBS-D with vague abdominal symptoms. She reports symptoms are improving since last visit.   she has a diagnosis of GERD which is currently  managed by protonix 40 mg (previously on ranitidine).  she reports symptoms is currently well controlled, and denies breakthrough reflux, burning in chest, hoarseness or cough.    BMI is Body mass index is 21.93 kg/m., she has been working on diet but not exercise. Wt Readings from Last 3 Encounters:  07/16/17 140 lb (63.5 kg)  04/14/17 137 lb 12.8 oz (62.5 kg)  03/10/17 142 lb (64.4 kg)   Her blood pressure has been controlled at home, today their BP is BP: 110/70  She does not workout. She denies chest pain, shortness of breath, dizziness.   She is not on cholesterol medication and denies myalgias. Her cholesterol is at goal. The cholesterol last visit was:   Lab Results  Component Value Date   CHOL 192 04/14/2017   HDL 72 04/14/2017   LDLCALC 84 01/05/2017   TRIG 130 04/14/2017   CHOLHDL 2.7 04/14/2017    She has been working on diet and exercise for glucose management, and denies increased appetite, nausea, paresthesia of the feet, polydipsia, polyuria, visual disturbances and vomiting. Last A1C in the office was:  Lab Results  Component Value Date   HGBA1C 4.7 04/14/2017   Patient is on Vitamin D supplement and at goal at recent check:   Lab Results  Component Value Date   VD25OH 86 04/14/2017       Current Medications:  Current Outpatient Medications on File Prior to Visit  Medication Sig  . aspirin 81 MG tablet Take 81 mg by mouth every other day.   . bumetanide (BUMEX) 2 MG tablet Take 1 tablet (2 mg total) by mouth 2 (two) times daily.  . Cholecalciferol (VITAMIN D3) 5000 units CAPS Takes 1 cap daily  .  fluticasone (FLONASE) 50 MCG/ACT nasal spray Place 1 spray into both nostrils daily. (Patient taking differently: Place 1 spray into both nostrils as needed. )  . montelukast (SINGULAIR) 10 MG tablet Take 1 tablet daily for allergies  . Multiple Vitamin (MULTIVITAMIN) tablet Take 1 tablet by mouth daily.  . pantoprazole (PROTONIX) 40 MG tablet Take 1 tablet daily  for Acid reflux & Indigestion  . potassium chloride SA (KLOR-CON M20) 20 MEQ tablet Take 1 tablet (20 mEq total) by mouth 2 (two) times daily.  Marland Kitchen topiramate (TOPAMAX) 50 MG tablet Take 1 tablet (50 mg total) by mouth 2 (two) times daily.  Marland Kitchen ULORIC 40 MG tablet TAKE 1 TABLET DAILY  . citalopram (CELEXA) 20 MG tablet Take 1 tablet daily for Anxiety & Tension  . loratadine (CLARITIN) 10 MG tablet Take 1 tablet (10 mg total) by mouth daily as needed for allergies. (Patient not taking: Reported on 07/16/2017)  . Magnesium 250 MG TABS Take 2 tablets by mouth 2 (two) times daily.  Marland Kitchen nystatin (MYCOSTATIN) 100000 UNIT/ML suspension 5 ml four times a day, retain in mouth as long as possible (Swish and Spit).  Use for 48 hours after symptoms resolve. (Patient not taking: Reported on 07/16/2017)  . ranitidine (ZANTAC) 300 MG tablet TAKE 1 TABLET DAILY FOR    ACID REFLUX (Patient not taking: Reported on 07/16/2017)   No current facility-administered medications on file prior to visit.      Allergies:  Allergies  Allergen Reactions  . Alprazolam Other (See Comments)     Unknown reaction per pt  . Prednisone     Only high doses  . Allopurinol Rash     Medical History:  Past Medical History:  Diagnosis Date  . Allergy   . Contact lens/glasses fitting    wears contacts or glasses  . GERD (gastroesophageal reflux disease)   . Hyperlipidemia   . Hypertension   . Irritable bowel syndrome (IBS)   . Peripheral neuropathy    in feet  . PONV (postoperative nausea and vomiting)    nausea, slow to wake up at times  . Vitamin D deficiency    Family history- Reviewed and unchanged Social history- Reviewed and unchanged   Review of Systems:  Review of Systems  Constitutional: Negative for malaise/fatigue and weight loss.  HENT: Negative for hearing loss and tinnitus.   Eyes: Negative for blurred vision and double vision.  Respiratory: Negative for cough, shortness of breath and wheezing.    Cardiovascular: Negative for chest pain, palpitations, orthopnea, claudication and leg swelling.  Gastrointestinal: Positive for constipation and diarrhea. Negative for abdominal pain, blood in stool, heartburn, melena, nausea and vomiting.  Genitourinary: Negative.   Musculoskeletal: Negative for joint pain and myalgias.  Skin: Negative for rash.  Neurological: Negative for dizziness, tingling, sensory change, weakness and headaches.  Endo/Heme/Allergies: Negative for polydipsia.  Psychiatric/Behavioral: Negative.   All other systems reviewed and are negative.     Physical Exam: BP 110/70   Pulse (!) 56   Temp (!) 97.2 F (36.2 C)   Ht 5\' 7"  (1.702 m)   Wt 140 lb (63.5 kg)   SpO2 99%   BMI 21.93 kg/m  Wt Readings from Last 3 Encounters:  07/16/17 140 lb (63.5 kg)  04/14/17 137 lb 12.8 oz (62.5 kg)  03/10/17 142 lb (64.4 kg)   General Appearance: Well nourished, in no apparent distress. Eyes: PERRLA, EOMs, conjunctiva no swelling or erythema Sinuses: No Frontal/maxillary tenderness ENT/Mouth: Ext aud canals clear,  TMs without erythema, bulging. No erythema, swelling, or exudate on post pharynx.  Tonsils not swollen or erythematous. Hearing normal.  Neck: Supple, thyroid normal.  Respiratory: Respiratory effort normal, BS equal bilaterally without rales, rhonchi, wheezing or stridor.  Cardio: RRR with no MRGs. Brisk peripheral pulses without edema.  Abdomen: Soft, + BS.  Non tender, no guarding, rebound, hernias, masses. Lymphatics: Non tender without lymphadenopathy.  Musculoskeletal: Full ROM, 5/5 strength, Normal gait Skin: Warm, dry without rashes, lesions, ecchymosis.  Neuro: Cranial nerves intact. No cerebellar symptoms.  Psych: Awake and oriented X 3, normal affect, Insight and Judgment appropriate.    [name], NP 3:04 PM [facility]

## 2017-07-16 ENCOUNTER — Encounter: Payer: Self-pay | Admitting: Adult Health

## 2017-07-16 ENCOUNTER — Ambulatory Visit: Payer: 59 | Admitting: Adult Health

## 2017-07-16 VITALS — BP 110/70 | HR 56 | Temp 97.2°F | Ht 67.0 in | Wt 140.0 lb

## 2017-07-16 DIAGNOSIS — E559 Vitamin D deficiency, unspecified: Secondary | ICD-10-CM

## 2017-07-16 DIAGNOSIS — K582 Mixed irritable bowel syndrome: Secondary | ICD-10-CM

## 2017-07-16 DIAGNOSIS — Z79899 Other long term (current) drug therapy: Secondary | ICD-10-CM | POA: Diagnosis not present

## 2017-07-16 DIAGNOSIS — K219 Gastro-esophageal reflux disease without esophagitis: Secondary | ICD-10-CM

## 2017-07-16 DIAGNOSIS — R7309 Other abnormal glucose: Secondary | ICD-10-CM

## 2017-07-16 DIAGNOSIS — E782 Mixed hyperlipidemia: Secondary | ICD-10-CM

## 2017-07-16 DIAGNOSIS — I10 Essential (primary) hypertension: Secondary | ICD-10-CM

## 2017-07-16 MED ORDER — ALLOPURINOL 300 MG PO TABS: 150.0000 mg | ORAL_TABLET | Freq: Every day | ORAL | 2 refills | Status: DC

## 2017-07-16 MED ORDER — AMITIZA 8 MCG PO CAPS: 8.0000 ug | ORAL_CAPSULE | Freq: Two times a day (BID) | ORAL | 1 refills | Status: DC | PRN

## 2017-07-16 NOTE — Patient Instructions (Signed)
If diarrhea is severe - 4+ watery diarrhea episodes, take imodium to reduce fluid loss, and start on electrolyte supplemented fluids.   Please go to the ER if you have any severe AB pain, unable to hold down food/water, blood in stool or vomit, chest pain, shortness of breath, or any worsening symptoms.   Consider keeping a food diary- common causes of diarrhea are dairy, certain carbs...  FODMAP stands for fermentable oligo-, di-, mono-saccharides and polyols (1). These are the scientific terms used to classify groups of carbs that are notorious for triggering digestive symptoms like bloating, gas and stomach pain.   FODMAPs are found in a wide range of foods in varying amounts. Some foods contain just one type, while others contain several.  The main dietary sources of the four groups of FODMAPs include:  Oligosaccharides: Wheat, rye, legumes and various fruits and vegetables, such as garlic and onions.  Disaccharides: Milk, yogurt and soft cheese. Lactose is the main carb.  Monosaccharides: Various fruit including figs and mangoes, and sweeteners such as honey and agave nectar. Fructose is the main carb.  Polyols: Certain fruits and vegetables including blackberries and lychee, as well as some low-calorie sweeteners like those in sugar-free gum.   Keep a food diary. This will help you identify foods that cause symptoms. Write down: ? What you eat and when. ? What symptoms you have. ? When symptoms occur in relation to your meals.  Avoid foods that cause symptoms. Talk with your dietitian about other ways to get the same nutrients that are in these foods.  Eat your meals slowly, in a relaxed setting.  Aim to eat 5-6 small meals per day. Do not skip meals.  Drink enough fluids to keep your urine clear or pale yellow.  Ask your health care provider if you should take an over-the-counter probiotic during flare-ups to help restore healthy gut bacteria.  If you have cramping or  diarrhea, try making your meals low in fat and high in carbohydrates. Examples of carbohydrates are pasta, rice, whole grain breads and cereals, fruits, and vegetables.  If dairy products cause your symptoms to flare up, try eating less of them. You might be able to handle yogurt better than other dairy products because it contains bacteria that help with digestion.

## 2017-07-17 ENCOUNTER — Other Ambulatory Visit: Payer: Self-pay

## 2017-07-17 LAB — CBC WITH DIFFERENTIAL/PLATELET
BASOS PCT: 1.2 %
Basophils Absolute: 114 cells/uL (ref 0–200)
EOS ABS: 105 {cells}/uL (ref 15–500)
EOS PCT: 1.1 %
HCT: 38.2 % (ref 35.0–45.0)
HEMOGLOBIN: 13.3 g/dL (ref 11.7–15.5)
LYMPHS ABS: 4285 {cells}/uL — AB (ref 850–3900)
MCH: 32.2 pg (ref 27.0–33.0)
MCHC: 34.8 g/dL (ref 32.0–36.0)
MCV: 92.5 fL (ref 80.0–100.0)
MONOS PCT: 6.3 %
MPV: 11.7 fL (ref 7.5–12.5)
NEUTROS ABS: 4399 {cells}/uL (ref 1500–7800)
Neutrophils Relative %: 46.3 %
Platelets: 366 10*3/uL (ref 140–400)
RBC: 4.13 10*6/uL (ref 3.80–5.10)
RDW: 11.7 % (ref 11.0–15.0)
Total Lymphocyte: 45.1 %
WBC mixed population: 599 cells/uL (ref 200–950)
WBC: 9.5 10*3/uL (ref 3.8–10.8)

## 2017-07-17 LAB — HEPATIC FUNCTION PANEL
AG Ratio: 1.6 (calc) (ref 1.0–2.5)
ALBUMIN MSPROF: 4.4 g/dL (ref 3.6–5.1)
ALT: 14 U/L (ref 6–29)
AST: 24 U/L (ref 10–35)
Alkaline phosphatase (APISO): 71 U/L (ref 33–130)
BILIRUBIN TOTAL: 0.6 mg/dL (ref 0.2–1.2)
Bilirubin, Direct: 0.1 mg/dL (ref 0.0–0.2)
Globulin: 2.8 g/dL (calc) (ref 1.9–3.7)
Indirect Bilirubin: 0.5 mg/dL (calc) (ref 0.2–1.2)
Total Protein: 7.2 g/dL (ref 6.1–8.1)

## 2017-07-17 LAB — BASIC METABOLIC PANEL WITH GFR
BUN/Creatinine Ratio: 17 (calc) (ref 6–22)
BUN: 21 mg/dL (ref 7–25)
CO2: 29 mmol/L (ref 20–32)
CREATININE: 1.21 mg/dL — AB (ref 0.50–0.99)
Calcium: 10.2 mg/dL (ref 8.6–10.4)
Chloride: 107 mmol/L (ref 98–110)
GFR, EST NON AFRICAN AMERICAN: 48 mL/min/{1.73_m2} — AB (ref >=60)
GFR, Est African American: 56 mL/min/{1.73_m2} — ABNORMAL LOW (ref >=60)
Glucose, Bld: 80 mg/dL (ref 65–99)
Potassium: 4.5 mmol/L (ref 3.5–5.3)
Sodium: 145 mmol/L (ref 135–146)

## 2017-07-17 LAB — LIPID PANEL
Cholesterol: 172 mg/dL (ref <200)
HDL: 62 mg/dL (ref >50)
LDL Cholesterol (Calc): 90 mg/dL (calc)
NON-HDL CHOLESTEROL (CALC): 110 mg/dL (ref <130)
TRIGLYCERIDES: 105 mg/dL (ref <150)
Total CHOL/HDL Ratio: 2.8 (calc) (ref <5.0)

## 2017-07-17 LAB — TSH: TSH: 1.39 m[IU]/L (ref 0.40–4.50)

## 2017-07-17 MED ORDER — LUBIPROSTONE 8 MCG PO CAPS: 8.0000 ug | ORAL_CAPSULE | Freq: Two times a day (BID) | ORAL | 2 refills | Status: DC
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
## 2017-07-23 ENCOUNTER — Encounter: Payer: Self-pay | Admitting: Adult Health

## 2017-07-23 ENCOUNTER — Ambulatory Visit: Payer: 59 | Admitting: Adult Health

## 2017-07-23 VITALS — BP 110/72 | HR 58 | Temp 97.5°F | Ht 67.0 in | Wt 138.4 lb

## 2017-07-23 DIAGNOSIS — T148XXA Other injury of unspecified body region, initial encounter: Secondary | ICD-10-CM

## 2017-07-23 MED ORDER — CYCLOBENZAPRINE HCL 5 MG PO TABS: 5.0000 mg | ORAL_TABLET | Freq: Three times a day (TID) | ORAL | 0 refills | Status: DC | PRN
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
## 2017-08-17 ENCOUNTER — Encounter: Payer: Self-pay | Admitting: Physician Assistant

## 2017-08-17 ENCOUNTER — Ambulatory Visit: Payer: 59 | Admitting: Physician Assistant

## 2017-08-17 VITALS — BP 130/80 | HR 58 | Temp 97.7°F | Ht 67.0 in | Wt 140.0 lb

## 2017-08-17 DIAGNOSIS — F419 Anxiety disorder, unspecified: Secondary | ICD-10-CM | POA: Diagnosis not present

## 2017-08-17 DIAGNOSIS — R002 Palpitations: Secondary | ICD-10-CM | POA: Diagnosis not present

## 2017-08-17 DIAGNOSIS — M791 Myalgia, unspecified site: Secondary | ICD-10-CM | POA: Diagnosis not present

## 2017-08-17 DIAGNOSIS — R0600 Dyspnea, unspecified: Secondary | ICD-10-CM

## 2017-08-17 MED ORDER — DIAZEPAM 2 MG PO TABS: ORAL_TABLET | ORAL | 0 refills | Status: DC

## 2017-08-17 NOTE — Patient Instructions (Signed)
Palpitations A palpitation is the feeling that your heartbeat is irregular or is faster than normal. It may feel like your heart is fluttering or skipping a beat. Palpitations are usually not a serious problem. They may be caused by many things, including smoking, caffeine, alcohol, stress, and certain medicines. Although most causes of palpitations are not serious, palpitations can be a sign of a serious medical problem. In some cases, you may need further medical evaluation. Follow these instructions at home: Pay attention to any changes in your symptoms. Take these actions to help with your condition:  Avoid the following: ? Caffeinated coffee, tea, soft drinks, diet pills, and energy drinks. ? Chocolate. ? Alcohol.  Do not use any tobacco products, such as cigarettes, chewing tobacco, and e-cigarettes. If you need help quitting, ask your health care provider.  Try to reduce your stress and anxiety. Things that can help you relax include: ? Yoga. ? Meditation. ? Physical activity, such as swimming, jogging, or walking. ? Biofeedback. This is a method that helps you learn to use your mind to control things in your body, such as your heartbeats.  Get plenty of rest and sleep.  Take over-the-counter and prescription medicines only as told by your health care provider.  Keep all follow-up visits as told by your health care provider. This is important.  Contact a health care provider if:  You continue to have a fast or irregular heartbeat after 24 hours.  Your palpitations occur more often. Get help right away if:  You have chest pain or shortness of breath.  You have a severe headache.  You feel dizzy or you faint. This information is not intended to replace advice given to you by your health care provider. Make sure you discuss any questions you have with your health care provider. Document Released: 06/06/2000 Document Revised: 11/12/2015 Document Reviewed: 02/22/2015 Elsevier  Interactive Patient Education  2018 Elsevier Inc.   Living With Anxiety After being diagnosed with an anxiety disorder, you may be relieved to know why you have felt or behaved a certain way. It is natural to also feel overwhelmed about the treatment ahead and what it will mean for your life. With care and support, you can manage this condition and recover from it. How to cope with anxiety Dealing with stress Stress is your body's reaction to life changes and events, both good and bad. Stress can last just a few hours or it can be ongoing. Stress can play a major role in anxiety, so it is important to learn both how to cope with stress and how to think about it differently. Talk with your health care provider or a counselor to learn more about stress reduction. He or she may suggest some stress reduction techniques, such as:  Music therapy. This can include creating or listening to music that you enjoy and that inspires you.  Mindfulness-based meditation. This involves being aware of your normal breaths, rather than trying to control your breathing. It can be done while sitting or walking.  Centering prayer. This is a kind of meditation that involves focusing on a word, phrase, or sacred image that is meaningful to you and that brings you peace.  Deep breathing. To do this, expand your stomach and inhale slowly through your nose. Hold your breath for 3-5 seconds. Then exhale slowly, allowing your stomach muscles to relax.  Self-talk. This is a skill where you identify thought patterns that lead to anxiety reactions and correct those thoughts.  Muscle   relaxation. This involves tensing muscles then relaxing them.  Choose a stress reduction technique that fits your lifestyle and personality. Stress reduction techniques take time and practice. Set aside 5-15 minutes a day to do them. Therapists can offer training in these techniques. The training may be covered by some insurance plans. Other  things you can do to manage stress include:  Keeping a stress diary. This can help you learn what triggers your stress and ways to control your response.  Thinking about how you respond to certain situations. You may not be able to control everything, but you can control your reaction.  Making time for activities that help you relax, and not feeling guilty about spending your time in this way.  Therapy combined with coping and stress-reduction skills provides the best chance for successful treatment. Medicines Medicines can help ease symptoms. Medicines for anxiety include:  Anti-anxiety drugs.  Antidepressants.  Beta-blockers.  Medicines may be used as the main treatment for anxiety disorder, along with therapy, or if other treatments are not working. Medicines should be prescribed by a health care provider. Relationships Relationships can play a big part in helping you recover. Try to spend more time connecting with trusted friends and family members. Consider going to couples counseling, taking family education classes, or going to family therapy. Therapy can help you and others better understand the condition. How to recognize changes in your condition Everyone has a different response to treatment for anxiety. Recovery from anxiety happens when symptoms decrease and stop interfering with your daily activities at home or work. This may mean that you will start to:  Have better concentration and focus.  Sleep better.  Be less irritable.  Have more energy.  Have improved memory.  It is important to recognize when your condition is getting worse. Contact your health care provider if your symptoms interfere with home or work and you do not feel like your condition is improving. Where to find help and support: You can get help and support from these sources:  Self-help groups.  Online and community organizations.  A trusted spiritual leader.  Couples counseling.  Family  education classes.  Family therapy.  Follow these instructions at home:  Eat a healthy diet that includes plenty of vegetables, fruits, whole grains, low-fat dairy products, and lean protein. Do not eat a lot of foods that are high in solid fats, added sugars, or salt.  Exercise. Most adults should do the following: ? Exercise for at least 150 minutes each week. The exercise should increase your heart rate and make you sweat (moderate-intensity exercise). ? Strengthening exercises at least twice a week.  Cut down on caffeine, tobacco, alcohol, and other potentially harmful substances.  Get the right amount and quality of sleep. Most adults need 7-9 hours of sleep each night.  Make choices that simplify your life.  Take over-the-counter and prescription medicines only as told by your health care provider.  Avoid caffeine, alcohol, and certain over-the-counter cold medicines. These may make you feel worse. Ask your pharmacist which medicines to avoid.  Keep all follow-up visits as told by your health care provider. This is important. Questions to ask your health care provider  Would I benefit from therapy?  How often should I follow up with a health care provider?  How long do I need to take medicine?  Are there any long-term side effects of my medicine?  Are there any alternatives to taking medicine? Contact a health care provider if:  You   have a hard time staying focused or finishing daily tasks.  You spend many hours a day feeling worried about everyday life.  You become exhausted by worry.  You start to have headaches, feel tense, or have nausea.  You urinate more than normal.  You have diarrhea. Get help right away if:  You have a racing heart and shortness of breath.  You have thoughts of hurting yourself or others. If you ever feel like you may hurt yourself or others, or have thoughts about taking your own life, get help right away. You can go to your nearest  emergency department or call:  Your local emergency services (911 in the [place]  A suicide crisis helpline, such as the National Suicide Prevention Lifeline at 1-800-273-8255. This is open 24-hours a day.  Summary  Taking steps to deal with stress can help calm you.  Medicines cannot cure anxiety disorders, but they can help ease symptoms.  Family, friends, and partners can play a big part in helping you recover from an anxiety disorder. This information is not intended to replace advice given to you by your health care provider. Make sure you discuss any questions you have with your health care provider. Document Released: 06/03/2016 Document Revised: 06/03/2016 Document Reviewed: 06/03/2016 Elsevier Interactive Patient Education  2018 Elsevier Inc.  
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
## 2017-08-18 LAB — BASIC METABOLIC PANEL WITH GFR
BUN / CREAT RATIO: 17 (calc) (ref 6–22)
BUN: 22 mg/dL (ref 7–25)
CO2: 28 mmol/L (ref 20–32)
CREATININE: 1.3 mg/dL — AB (ref 0.50–0.99)
Calcium: 10.2 mg/dL (ref 8.6–10.4)
Chloride: 106 mmol/L (ref 98–110)
GFR, Est African American: 51 mL/min/{1.73_m2} — ABNORMAL LOW (ref >=60)
GFR, Est Non African American: 44 mL/min/{1.73_m2} — ABNORMAL LOW (ref >=60)
GLUCOSE: 87 mg/dL (ref 65–99)
Potassium: 4 mmol/L (ref 3.5–5.3)
SODIUM: 145 mmol/L (ref 135–146)

## 2017-08-18 LAB — HEPATIC FUNCTION PANEL
AG RATIO: 1.6 (calc) (ref 1.0–2.5)
ALKALINE PHOSPHATASE (APISO): 62 U/L (ref 33–130)
ALT: 16 U/L (ref 6–29)
AST: 26 U/L (ref 10–35)
Albumin: 4.4 g/dL (ref 3.6–5.1)
BILIRUBIN INDIRECT: 0.7 mg/dL (ref 0.2–1.2)
BILIRUBIN TOTAL: 0.8 mg/dL (ref 0.2–1.2)
Bilirubin, Direct: 0.1 mg/dL (ref 0.0–0.2)
Globulin: 2.7 g/dL (calc) (ref 1.9–3.7)
TOTAL PROTEIN: 7.1 g/dL (ref 6.1–8.1)

## 2017-08-18 LAB — IRON, TOTAL/TOTAL IRON BINDING CAP
%SAT: 34 % (calc) (ref 11–50)
Iron: 112 ug/dL (ref 45–160)
TIBC: 332 mcg/dL (calc) (ref 250–450)

## 2017-08-18 LAB — D-DIMER, QUANTITATIVE: D-Dimer, Quant: 0.3 mcg/mL FEU (ref <0.50)

## 2017-08-18 LAB — CBC WITH DIFFERENTIAL/PLATELET
BASOS ABS: 123 {cells}/uL (ref 0–200)
Basophils Relative: 1.6 %
EOS ABS: 69 {cells}/uL (ref 15–500)
Eosinophils Relative: 0.9 %
HCT: 36.6 % (ref 35.0–45.0)
HEMOGLOBIN: 13.3 g/dL (ref 11.7–15.5)
Lymphs Abs: 2510 cells/uL (ref 850–3900)
MCH: 33.3 pg — AB (ref 27.0–33.0)
MCHC: 36.3 g/dL — AB (ref 32.0–36.0)
MCV: 91.5 fL (ref 80.0–100.0)
MONOS PCT: 8.9 %
MPV: 11.7 fL (ref 7.5–12.5)
NEUTROS ABS: 4312 {cells}/uL (ref 1500–7800)
NEUTROS PCT: 56 %
Platelets: 314 10*3/uL (ref 140–400)
RBC: 4 10*6/uL (ref 3.80–5.10)
RDW: 11.9 % (ref 11.0–15.0)
TOTAL LYMPHOCYTE: 32.6 %
WBC mixed population: 685 cells/uL (ref 200–950)
WBC: 7.7 10*3/uL (ref 3.8–10.8)

## 2017-08-18 LAB — TSH: TSH: 1.99 m[IU]/L (ref 0.40–4.50)

## 2017-08-18 LAB — MAGNESIUM: Magnesium: 2 mg/dL (ref 1.5–2.5)

## 2017-08-19 ENCOUNTER — Telehealth: Payer: Self-pay

## 2017-08-19 NOTE — Telephone Encounter (Signed)
Patient wants to discuss how can she increase fluid & not worry about depleting her potassium?

## 2017-08-20 ENCOUNTER — Other Ambulatory Visit: Payer: Self-pay | Admitting: Physician Assistant

## 2017-08-20 DIAGNOSIS — K219 Gastro-esophageal reflux disease without esophagitis: Secondary | ICD-10-CM

## 2017-08-20 NOTE — Telephone Encounter (Signed)
Mailed out letter with suggestions about potassium enriched foods.

## 2017-09-02 NOTE — Progress Notes (Signed)
Assessment and Plan:  Azaria was seen today for acute visit.  Diagnoses and all orders for this visit:  Pain of first metatarsophalangeal (MTP) joint Appears gout flare resolving; will check labs today - continue with allopurinol, will try adding colchicine. Instructed to start taking with first sign of flare with next event. She declines prednisone taper today.  -     Uric acid -     colchicine 0.6 MG tablet; Take 1 tablet (0.6 mg total) by mouth daily. -     CBC with Differential/Platelet -     BASIC METABOLIC PANEL WITH GFR  Further disposition pending results of labs. Discussed med's effects and SE's.   Over 15 minutes of exam, counseling, chart review, and critical decision making was performed.   Future Appointments  Date Time Provider [place]  09/16/2017  2:00 PM Richardo Priest, MD [facility] None  10/22/2017  3:45 PM Unk Pinto, MD [facility] None  04/21/2018  3:00 PM Unk Pinto, MD [facility] None    ------------------------------------------------------------------------------------------------------------------   HPI BP 122/78   Pulse 77   Temp (!) 97.5 F (36.4 C)   Resp 16   Ht 5\' 7"  (1.702 m)   Wt 140 lb (63.5 kg)   SpO2 96%   BMI 21.93 kg/m   62 y.o.female presents for pain of her left 1st MTP joint which began 4 days ago and was 10/10, sharp/tender pain, non-radiating, inflamed/injected - she reports she pushed fluids and it seems to be resolving - pain today reportedly 5/10. Lateral aspect of joint in question is injected but without notable inflammation. She is not particularly tender to palpation. She denies fever/chills/nausea/weakness/recent injury.  She does not drink alcohol . She presents wearing pointed toe leather flats today.   Patient is on allopurinol for hx of hyperuricemia and recurrent joint pain in "flare" presentation approximately every 2 years.  Lab Results  Component Value Date   LABURIC 4.1 04/14/2017     Past  Medical History:  Diagnosis Date  . Abnormal glucose 02/05/2015  . Allergy   . Contact lens/glasses fitting    wears contacts or glasses  . GERD (gastroesophageal reflux disease)   . Hyperlipidemia   . Hypertension   . Hyperuricemia 08/13/2014  . Irritable bowel syndrome 10/18/2007  . Irritable bowel syndrome (IBS)   . Medication management 12/26/2013  . Peripheral neuropathy    in feet  . POLYP, COLON 10/18/2007  . PONV (postoperative nausea and vomiting)    nausea, slow to wake up at times  . Vitamin D deficiency      Allergies  Allergen Reactions  . Alprazolam Other (See Comments)     Unknown reaction per pt  . Prednisone     Only high doses  . Allopurinol Rash    Current Outpatient Medications on File Prior to Visit  Medication Sig  . allopurinol (ZYLOPRIM) 300 MG tablet Take 0.5 tablets (150 mg total) by mouth daily.  . AMITIZA 8 MCG capsule Take 1 capsule (8 mcg total) by mouth 2 (two) times daily as needed for constipation.  Marland Kitchen aspirin 81 MG tablet Take 81 mg by mouth every other day.   . bumetanide (BUMEX) 2 MG tablet Take 1 tablet (2 mg total) by mouth 2 (two) times daily.  . Cholecalciferol (VITAMIN D3) 5000 units CAPS Takes 1 cap daily  . cyclobenzaprine (FLEXERIL) 5 MG tablet Take 1 tablet (5 mg total) by mouth 3 (three) times daily as needed for muscle spasms.  . diazepam (  VALIUM) 2 MG tablet 1/2-1 tablet as needed every 8 hours for anxiety or muscle spasm.  . fluticasone (FLONASE) 50 MCG/ACT nasal spray Place 1 spray into both nostrils daily. (Patient taking differently: Place 1 spray into both nostrils as needed. )  . loratadine (CLARITIN) 10 MG tablet Take 1 tablet (10 mg total) by mouth daily as needed for allergies.  Marland Kitchen lubiprostone (AMITIZA) 8 MCG capsule Take 1 capsule (8 mcg total) by mouth 2 (two) times daily with a meal.  . Magnesium 250 MG TABS Take 2 tablets by mouth 2 (two) times daily.  . montelukast (SINGULAIR) 10 MG tablet Take 1 tablet daily for  allergies  . Multiple Vitamin (MULTIVITAMIN) tablet Take 1 tablet by mouth daily.  Marland Kitchen nystatin (MYCOSTATIN) 100000 UNIT/ML suspension 5 ml four times a day, retain in mouth as long as possible (Swish and Spit).  Use for 48 hours after symptoms resolve.  . pantoprazole (PROTONIX) 40 MG tablet TAKE 1 TABLET DAILY FOR  ACID REFLUX & INDIGESTION  . potassium chloride SA (KLOR-CON M20) 20 MEQ tablet Take 1 tablet (20 mEq total) by mouth 2 (two) times daily.  . ranitidine (ZANTAC) 300 MG tablet TAKE 1 TABLET DAILY FOR    ACID REFLUX  . topiramate (TOPAMAX) 50 MG tablet Take 1 tablet (50 mg total) by mouth 2 (two) times daily.  . citalopram (CELEXA) 20 MG tablet Take 1 tablet daily for Anxiety & Tension (Patient not taking: Reported on 08/17/2017)   No current facility-administered medications on file prior to visit.     ROS: all negative except above.   Physical Exam:  BP 122/78   Pulse 77   Temp (!) 97.5 F (36.4 C)   Resp 16   Ht 5\' 7"  (1.702 m)   Wt 140 lb (63.5 kg)   SpO2 96%   BMI 21.93 kg/m   General Appearance: Well nourished, in no apparent distress. Neck: Supple.  Respiratory: Respiratory effort normal, BS equal bilaterally without rales, rhonchi, wheezing or stridor.  Cardio: RRR with no MRGs. Brisk peripheral pulses without edema.  Musculoskeletal: Full ROM, 5/5 strength, normal gait. Left 1st MTP joint mildly injected on medial side; joint without effusion/inflammation, no crepitus or bony abnormality, full ROM, not particularly tender to palpation.  Skin: Warm, dry without rashes, lesions, ecchymosis.  Psych: Awake and oriented X 3, normal affect, Insight and Judgment appropriate.    [name], NP 5:00 PM [facility]

## 2017-09-03 ENCOUNTER — Encounter: Payer: Self-pay | Admitting: Adult Health

## 2017-09-03 ENCOUNTER — Ambulatory Visit: Payer: 59 | Admitting: Adult Health

## 2017-09-03 VITALS — BP 122/78 | HR 77 | Temp 97.5°F | Resp 16 | Ht 67.0 in | Wt 140.0 lb

## 2017-09-03 DIAGNOSIS — Z79899 Other long term (current) drug therapy: Secondary | ICD-10-CM

## 2017-09-03 DIAGNOSIS — M25476 Effusion, unspecified foot: Secondary | ICD-10-CM

## 2017-09-03 DIAGNOSIS — E790 Hyperuricemia without signs of inflammatory arthritis and tophaceous disease: Secondary | ICD-10-CM | POA: Diagnosis not present

## 2017-09-03 MED ORDER — COLCHICINE 0.6 MG PO TABS: 0.6000 mg | ORAL_TABLET | Freq: Every day | ORAL | 2 refills | Status: DC

## 2017-09-03 NOTE — Patient Instructions (Signed)
Follow these instructions at home:  Take over-the-counter and prescription medicines only as told by your health care provider.  Do not wear high heels or other restrictive footwear. Wear comfortable, supportive shoes that have a large toe box.  Wear orthotics as told by your health care provider, if this applies.  Put your feet in cold water for 30 seconds, then in warm water for 30 seconds. Alternate between the cold and warm water for 5 minutes. Do this several times a day or as told by your health care provider.  If directed, apply ice to the injured area. ? Put ice in a plastic bag. ? Place a towel between your skin and the bag. ? Leave the ice on for 20 minutes, 2-3 times per day.  Do foot exercises as instructed by your health care provider or a physical therapist.  Keep all follow-up visits as told by your health care provider. This is important. Contact a health care provider if:  You notice bone spurs or growths on or around your big toe.  Your pain does not get better or it gets worse.  You have pain while resting.  You have pain in other parts of your body, such as your back, hip, or knee.  You start to limp. This information is not intended to replace advice given to you by your health care provider. Make sure you discuss any questions you have with your health care provider. Document Released: 06/09/2005 Document Revised: 11/15/2015 Document Reviewed: 02/14/2015 Elsevier Interactive Patient Education  2018 [employer].   Colchicine tablets or capsules What is this medicine? COLCHICINE (KOL chi seen) is for joint pain and swelling due to attacks of acute gouty arthritis. The medicine is also used to treat familial Mediterranean fever. This medicine may be used for other purposes; ask your health care provider or pharmacist if you have questions. COMMON BRAND NAME(S): Colcrys, MITIGARE What should I tell my health care provider before I take this  medicine? They need to know if you have any of these conditions: -anemia -blood disorders like leukemia or lymphoma -heart disease -immune system problems -intestinal disease -kidney disease -liver disease -muscle pain or weakness -take other medicines -stomach problems -an unusual or allergic reaction to colchicine, other medicines, lactose, foods, dyes, or preservatives -pregnant or trying to get pregnant -breast-feeding How should I use this medicine? Take this medicine by mouth with a full glass of water. Follow the directions on the prescription label. You can take it with or without food. If it upsets your stomach, take it with food. Take your medicine at regular intervals. Do not take your medicine more often than directed. A special MedGuide will be given to you by the pharmacist with each prescription and refill. Be sure to read this information carefully each time. Talk to your pediatrician regarding the use of this medicine in children. While this drug may be prescribed for children as young as 96 years old for selected conditions, precautions do apply. Patients over 23 years old may have a stronger reaction and need a smaller dose. Overdosage: If you think you have taken too much of this medicine contact a poison control center or emergency room at once. NOTE: This medicine is only for you. Do not share this medicine with others. What if I miss a dose? If you miss a dose, take it as soon as you can. If it is almost time for your next dose, take only that dose. Do not take double or  extra doses. What may interact with this medicine? Do not take this medicine with any of the following medications: -certain medicines for fungal infections like itraconazole This medicine may also interact with the following medications: -alcohol -certain medicines for cholesterol like atorvastatin -certain medicines for coughs and colds -certain medicines to help you breathe  better -cyclosporine -digoxin -epinephrine -grapefruit or grapefruit juice -methenamine -other medicines for fungal infection -sodium bicarbonate -some antibiotics like clarithromycin, erythromycin, and telithromycin -some medicines for an irregular heartbeat or other heart problems -some medicines for cancer, like lapatinib and tamoxifen -some medicines for HIV This list may not describe all possible interactions. Give your health care provider a list of all the medicines, herbs, non-prescription drugs, or dietary supplements you use. Also tell them if you smoke, drink alcohol, or use illegal drugs. Some items may interact with your medicine. What should I watch for while using this medicine? Visit your doctor or health care professional for regular checks on your progress. You may need periodic blood checks. Alcohol can increase the chance of getting stomach problems and gout attacks. Do not drink alcohol. What side effects may I notice from receiving this medicine? Side effects that you should report to your doctor or health care professional as soon as possible: -allergic reactions like skin rash, itching or hives, swelling of the face, lips, or tongue -fever, chills, or sore throat -muscle tenderness, pain, or weakness -numbness or tingling in hands or feet -unusual bleeding or bruising -unusually weak or tired -vomiting Side effects that usually do not require medical attention (report to your doctor or health care professional if they continue or are bothersome): -diarrhea -hair loss -loss of appetite -stomach pain or nausea This list may not describe all possible side effects. Call your doctor for medical advice about side effects. You may report side effects to FDA at 1-800-FDA-1088. Where should I keep my medicine? Keep out of the reach of children. Store at room temperature between 15 and 30 degrees C (59 and 86 degrees F). Keep container tightly closed. Protect from  light. Throw away any unused medicine after the expiration date. NOTE: This sheet is a summary. It may not cover all possible information. If you have questions about this medicine, talk to your doctor, pharmacist, or health care provider.  2018 Elsevier/[employer] Standard (2012-12-06 16:48:38)

## 2017-09-04 LAB — BASIC METABOLIC PANEL WITH GFR
BUN / CREAT RATIO: 21 (calc) (ref 6–22)
BUN: 22 mg/dL (ref 7–25)
CALCIUM: 9.7 mg/dL (ref 8.6–10.4)
CHLORIDE: 103 mmol/L (ref 98–110)
CO2: 30 mmol/L (ref 20–32)
CREATININE: 1.07 mg/dL — AB (ref 0.50–0.99)
GFR, Est African American: 64 mL/min/{1.73_m2} (ref >=60)
GFR, Est Non African American: 56 mL/min/{1.73_m2} — ABNORMAL LOW (ref >=60)
Glucose, Bld: 83 mg/dL (ref 65–99)
Potassium: 4.5 mmol/L (ref 3.5–5.3)
Sodium: 141 mmol/L (ref 135–146)

## 2017-09-04 LAB — CBC WITH DIFFERENTIAL/PLATELET
BASOS PCT: 1.6 %
Basophils Absolute: 142 cells/uL (ref 0–200)
EOS ABS: 107 {cells}/uL (ref 15–500)
Eosinophils Relative: 1.2 %
HCT: 38.8 % (ref 35.0–45.0)
HEMOGLOBIN: 13.7 g/dL (ref 11.7–15.5)
Lymphs Abs: 3747 cells/uL (ref 850–3900)
MCH: 32.9 pg (ref 27.0–33.0)
MCHC: 35.3 g/dL (ref 32.0–36.0)
MCV: 93.3 fL (ref 80.0–100.0)
MPV: 11.7 fL (ref 7.5–12.5)
Monocytes Relative: 9.2 %
NEUTROS ABS: 4085 {cells}/uL (ref 1500–7800)
Neutrophils Relative %: 45.9 %
PLATELETS: 333 10*3/uL (ref 140–400)
RBC: 4.16 10*6/uL (ref 3.80–5.10)
RDW: 12.4 % (ref 11.0–15.0)
TOTAL LYMPHOCYTE: 42.1 %
WBC: 8.9 10*3/uL (ref 3.8–10.8)
WBCMIX: 819 {cells}/uL (ref 200–950)

## 2017-09-04 LAB — URIC ACID: Uric Acid, Serum: 5.6 mg/dL (ref 2.5–7.0)

## 2017-09-16 ENCOUNTER — Ambulatory Visit: Payer: 59 | Admitting: Cardiology

## 2017-09-24 ENCOUNTER — Other Ambulatory Visit: Payer: Self-pay

## 2017-09-24 MED ORDER — ALLOPURINOL 300 MG PO TABS: 150.0000 mg | ORAL_TABLET | Freq: Every day | ORAL | 0 refills | Status: DC

## 2017-10-01 ENCOUNTER — Telehealth: Payer: Self-pay | Admitting: *Deleted

## 2017-10-01 NOTE — Telephone Encounter (Signed)
Patient called and complained of ringing in her ears. She has an appointment on 10/06/2017 and asked what she can until her appointment next week. Per Dr Melford Aase, try OTC decongestant. such as Phenylephrine.  A message was left to inform the patient.

## 2017-10-06 ENCOUNTER — Ambulatory Visit: Payer: 59 | Admitting: Internal Medicine

## 2017-10-06 ENCOUNTER — Encounter: Payer: Self-pay | Admitting: Internal Medicine

## 2017-10-06 VITALS — BP 124/82 | HR 60 | Temp 97.2°F | Resp 16 | Ht 67.0 in | Wt 144.2 lb

## 2017-10-06 DIAGNOSIS — E782 Mixed hyperlipidemia: Secondary | ICD-10-CM | POA: Diagnosis not present

## 2017-10-06 DIAGNOSIS — E790 Hyperuricemia without signs of inflammatory arthritis and tophaceous disease: Secondary | ICD-10-CM | POA: Diagnosis not present

## 2017-10-06 DIAGNOSIS — H6593 Unspecified nonsuppurative otitis media, bilateral: Secondary | ICD-10-CM

## 2017-10-06 DIAGNOSIS — R7309 Other abnormal glucose: Secondary | ICD-10-CM

## 2017-10-06 DIAGNOSIS — E559 Vitamin D deficiency, unspecified: Secondary | ICD-10-CM | POA: Diagnosis not present

## 2017-10-06 DIAGNOSIS — I10 Essential (primary) hypertension: Secondary | ICD-10-CM

## 2017-10-06 DIAGNOSIS — R42 Dizziness and giddiness: Secondary | ICD-10-CM

## 2017-10-06 DIAGNOSIS — Z79899 Other long term (current) drug therapy: Secondary | ICD-10-CM

## 2017-10-06 MED ORDER — DEXAMETHASONE 0.5 MG PO TABS: ORAL_TABLET | ORAL | 0 refills | Status: DC

## 2017-10-06 NOTE — Patient Instructions (Addendum)
Tinnitus Tinnitus refers to hearing a sound when there is no actual source for that sound. This is often described as ringing in the ears. However, people with this condition may hear a variety of noises. A person may hear the sound in one ear or in both ears. The sounds of tinnitus can be soft, loud, or somewhere in between. Tinnitus can last for a few seconds or can be constant for days. It may go away without treatment and come back at various times. When tinnitus is constant or happens often, it can lead to other problems, such as trouble sleeping and trouble concentrating. Almost everyone experiences tinnitus at some point. Tinnitus that is long-lasting (chronic) or comes back often is a problem that may require medical attention. What are the causes? The cause of tinnitus is often not known. In some cases, it can result from other problems or conditions, including:  Exposure to loud noises from machinery, music, or other sources.  Hearing loss.  Ear or sinus infections.  Earwax buildup.  A foreign object in the ear.  Use of certain medicines.  Use of alcohol and caffeine.  High blood pressure.  Heart diseases.  Anemia.  Allergies.  Meniere disease.  Thyroid problems.  Tumors.  An enlarged part of a weakened blood vessel (aneurysm).  What are the signs or symptoms? The main symptom of tinnitus is hearing a sound when there is no source for that sound. It may sound like:  Buzzing.  Roaring.  Ringing.  Blowing air, similar to the sound heard when you listen to a seashell.  Hissing.  Whistling.  Sizzling.  Humming.  Running water.  A sustained musical note.  How is this diagnosed? Tinnitus is diagnosed based on your symptoms. Your health care provider will do a physical exam. A comprehensive hearing exam (audiologic exam) will be done if your tinnitus:  Affects only one ear (unilateral).  Causes hearing difficulties.  Lasts 6 months or  longer.  You may also need to see a health care provider who specializes in hearing disorders (audiologist). You may be asked to complete a questionnaire to determine the severity of your tinnitus. Tests may be done to help determine the cause and to rule out other conditions. These can include:  Imaging studies of your head and brain, such as: ? A CT scan. ? An MRI.  An imaging study of your blood vessels (angiogram).  How is this treated? Treating an underlying medical condition can sometimes make tinnitus go away. If your tinnitus continues, other treatments may include:  Medicines, such as certain antidepressants or sleeping aids.  Sound generators to mask the tinnitus. These include: ? Tabletop sound machines that play relaxing sounds to help you fall asleep. ? Wearable devices that fit in your ear and play sounds or music. ? A small device that uses headphones to deliver a signal embedded in music (acoustic neural stimulation). In time, this may change the pathways of your brain and make you less sensitive to tinnitus. This device is used for very severe cases when no other treatment is working.  Therapy and counseling to help you manage the stress of living with tinnitus.  Using hearing aids or cochlear implants, if your tinnitus is related to hearing loss.  Follow these instructions at home:  When possible, avoid being in loud places and being exposed to loud sounds.  Wear hearing protection, such as earplugs, when you are exposed to loud noises.  Do not take stimulants, such as nicotine,   alcohol, or caffeine.  Practice techniques for reducing stress, such as meditation, yoga, or deep breathing.  Use a white noise machine, a humidifier, or other devices to mask the sound of tinnitus.  Sleep with your head slightly raised. This may reduce the impact of tinnitus.  Try to get plenty of rest each night. Contact a health care provider if:  You have tinnitus in just one  ear.  Your tinnitus continues for 3 weeks or longer without stopping.  Home care measures are not helping.  You have tinnitus after a head injury.  You have tinnitus along with any of the following: ? Dizziness. ? Loss of balance. ? Nausea and vomiting. This information is not intended to replace advice given to you by your health care provider. Make sure you discuss any questions you have with your health care provider. Document Released: 06/09/2005 Document Revised: 02/10/2016 Document Reviewed: 11/09/2013 Elsevier Interactive Patient Education  2018 [employer].  +++++++++++++++++++++++++++++++ Vertigo Vertigo is the feeling that you or your surroundings are moving when they are not. Vertigo can be dangerous if it occurs while you are doing something that could endanger you or others, such as driving. What are the causes? This condition is caused by a disturbance in the signals that are sent by your body's sensory systems to your brain. Different causes of a disturbance can lead to vertigo, including:  Infections, especially in the inner ear.  A bad reaction to a drug, or misuse of alcohol and medicines.  Withdrawal from drugs or alcohol.  Quickly changing positions, as when lying down or rolling over in bed.  Migraine headaches.  Decreased blood flow to the brain.  Decreased blood pressure.  Increased pressure in the brain from a head or neck injury, stroke, infection, tumor, or bleeding.  Central nervous system disorders.  What are the signs or symptoms? Symptoms of this condition usually occur when you move your head or your eyes in different directions. Symptoms may start suddenly, and they usually last for less than a minute. Symptoms may include:  Loss of balance and falling.  Feeling like you are spinning or moving.  Feeling like your surroundings are spinning or moving.  Nausea and vomiting.  Blurred vision or double vision.  Difficulty  hearing.  Slurred speech.  Dizziness.  Involuntary eye movement (nystagmus).  Symptoms can be mild and cause only slight annoyance, or they can be severe and interfere with daily life. Episodes of vertigo may return (recur) over time, and they are often triggered by certain movements. Symptoms may improve over time. How is this diagnosed? This condition may be diagnosed based on medical history and the quality of your nystagmus. Your health care provider may test your eye movements by asking you to quickly change positions to trigger the nystagmus. This may be called the Dix-Hallpike test, head thrust test, or roll test. You may be referred to a health care provider who specializes in ear, nose, and throat (ENT) problems (otolaryngologist) or a provider who specializes in disorders of the central nervous system (neurologist). You may have additional testing, including:  A physical exam.  Blood tests.  MRI.  A CT scan.  An electrocardiogram (ECG). This records electrical activity in your heart.  An electroencephalogram (EEG). This records electrical activity in your brain.  Hearing tests.  How is this treated? Treatment for this condition depends on the cause and the severity of the symptoms. Treatment options include:  Medicines to treat nausea or vertigo. These are usually  used for severe cases. Some medicines that are used to treat other conditions may also reduce or eliminate vertigo symptoms. These include: ? Medicines that control allergies (antihistamines). ? Medicines that control seizures (anticonvulsants). ? Medicines that relieve depression (antidepressants). ? Medicines that relieve anxiety (sedatives).  Head movements to adjust your inner ear back to normal. If your vertigo is caused by an ear problem, your health care provider may recommend certain movements to correct the problem.  Surgery. This is rare.  Follow these instructions at home: Safety  Move  slowly.Avoid sudden body or head movements.  Avoid driving.  Avoid operating heavy machinery.  Avoid doing any tasks that would cause danger to you or others if you would have a vertigo episode during the task.  If you have trouble walking or keeping your balance, try using a cane for stability. If you feel dizzy or unstable, sit down right away.  Return to your normal activities as told by your health care provider. Ask your health care provider what activities are safe for you. General instructions  Take over-the-counter and prescription medicines only as told by your health care provider.  Avoid certain positions or movements as told by your health care provider.  Drink enough fluid to keep your urine clear or pale yellow.  Keep all follow-up visits as told by your health care provider. This is important. Contact a health care provider if:  Your medicines do not relieve your vertigo or they make it worse.  You have a fever.  Your condition gets worse or you develop new symptoms.  Your family or friends notice any behavioral changes.  Your nausea or vomiting gets worse.  You have numbness or a "pins and needles" sensation in part of your body. Get help right away if:  You have difficulty moving or speaking.  You are always dizzy.  You faint.  You develop severe headaches.  You have weakness in your hands, arms, or legs.  You have changes in your hearing or vision.  You develop a stiff neck.  You develop sensitivity to light. This information is not intended to replace advice given to you by your health care provider. Make sure you discuss any questions you have with your health care provider. Document Released: 03/19/2005 Document Revised: 11/21/2015 Document Reviewed: 10/02/2014 Elsevier Interactive Patient Education  2018 [employer].  ++++++++++++++++++++++++++ How to Perform the Epley Maneuver The Epley maneuver is an exercise that relieves symptoms of  vertigo. Vertigo is the feeling that you or your surroundings are moving when they are not. When you feel vertigo, you may feel like the room is spinning and have trouble walking. Dizziness is a little different than vertigo. When you are dizzy, you may feel unsteady or light-headed. You can do this maneuver at home whenever you have symptoms of vertigo. You can do it up to 3 times a day until your symptoms go away. Even though the Epley maneuver may relieve your vertigo for a few weeks, it is possible that your symptoms will return. This maneuver relieves vertigo, but it does not relieve dizziness. What are the risks? If it is done correctly, the Epley maneuver is considered safe. Sometimes it can lead to dizziness or nausea that goes away after a short time. If you develop other symptoms, such as changes in vision, weakness, or numbness, stop doing the maneuver and call your health care provider. How to perform the Epley maneuver 1. Sit on the edge of a bed or table with  your back straight and your legs extended or hanging over the edge of the bed or table. 2. Turn your head halfway toward the affected ear or side. 3. Lie backward quickly with your head turned until you are lying flat on your back. You may want to position a pillow under your shoulders. 4. Hold this position for 30 seconds. You may experience an attack of vertigo. This is normal. 5. Turn your head to the opposite direction until your unaffected ear is facing the floor. 6. Hold this position for 30 seconds. You may experience an attack of vertigo. This is normal. Hold this position until the vertigo stops. 7. Turn your whole body to the same side as your head. Hold for another 30 seconds. 8. Sit back up. You can repeat this exercise up to 3 times a day. Follow these instructions at home:  After doing the Epley maneuver, you can return to your normal activities.  Ask your health care provider if there is anything you should do at  home to prevent vertigo. He or she may recommend that you: ? Keep your head raised (elevated) with two or more pillows while you sleep. ? Do not sleep on the side of your affected ear. ? Get up slowly from bed. ? Avoid sudden movements during the day. ? Avoid extreme head movement, like looking up or bending over. Contact a health care provider if:  Your vertigo gets worse.  You have other symptoms, including: ? Nausea. ? Vomiting. ? Headache. Get help right away if:  You have vision changes.  You have a severe or worsening headache or neck pain.  You cannot stop vomiting.  You have new numbness or weakness in any part of your body. Summary  Vertigo is the feeling that you or your surroundings are moving when they are not.  The Epley maneuver is an exercise that relieves symptoms of vertigo.  If the Epley maneuver is done correctly, it is considered safe. You can do it up to 3 times a day. This information is not intended to replace advice given to you by your health care provider. Make sure you discuss any questions you have with your health care provider. Document Released: 06/14/2013 Document Revised: 04/29/2016 Document Reviewed: 04/29/2016 Elsevier Interactive Patient Education  2017 [place].  ++++++++++++++++++++++++++ Recommend Adult Low Dose Aspirin or  coated  Aspirin 81 mg daily  To reduce risk of Colon Cancer 20 %,  Skin Cancer 26 % ,  Melanoma 46%  and  Pancreatic cancer 60% +++++++++++++++++++++++++ Vitamin D goal  is between 70-100.  Please make sure that you are taking your Vitamin D as directed.  It is very important as a natural anti-inflammatory  helping hair, skin, and nails, as well as reducing stroke and heart attack risk.  It helps your bones and helps with mood. It also decreases numerous cancer risks so please take it as directed.  Low Vit D is associated with a 200-300% higher risk for CANCER  and 200-300% higher risk for HEART    ATTACK  &  STROKE.   .....................................Marland Kitchen It is also associated with higher death rate at younger ages,  autoimmune diseases like Rheumatoid arthritis, Lupus, Multiple Sclerosis.    Also many other serious conditions, like depression, Alzheimer's Dementia, infertility, muscle aches, fatigue, fibromyalgia - just to name a few. ++++++++++++++++++++ Recommend the book "The END of DIETING" by Dr Excell Seltzer  & the book "The END of DIABETES " by Dr Excell Seltzer At  [email or web address] - get book & Audio CD's    Being diabetic has a  300% increased risk for heart attack, stroke, cancer, and alzheimer- type vascular dementia. It is very important that you work harder with diet by avoiding all foods that are white. Avoid white rice (brown & wild rice is OK), white potatoes (sweetpotatoes in moderation is OK), White bread or wheat bread or anything made out of white flour like bagels, donuts, rolls, buns, biscuits, cakes, pastries, cookies, pizza crust, and pasta (made from white flour & egg whites) - vegetarian pasta or spinach or wheat pasta is OK. Multigrain breads like Arnold's or [employer], or multigrain sandwich thins or flatbreads.  Diet, exercise and weight loss can reverse and cure diabetes in the early stages.  Diet, exercise and weight loss is very important in the control and prevention of complications of diabetes which affects every system in your body, ie. Brain - dementia/stroke, eyes - glaucoma/blindness, heart - heart attack/heart failure, kidneys - dialysis, stomach - gastric paralysis, intestines - malabsorption, nerves - severe painful neuritis, circulation - gangrene & loss of a leg(s), and finally cancer and Alzheimers.    I recommend avoid fried & greasy foods,  sweets/candy, white rice (brown or wild rice or Quinoa is OK), white potatoes (sweet potatoes are OK) - anything made from white flour - bagels, doughnuts, rolls, buns, biscuits,white and wheat breads, pizza crust  and traditional pasta made of white flour & egg white(vegetarian pasta or spinach or wheat pasta is OK).  Multi-grain bread is OK - like multi-grain flat bread or sandwich thins. Avoid alcohol in excess. Exercise is also important.    Eat all the vegetables you want - avoid meat, especially red meat and dairy - especially cheese.  Cheese is the most concentrated form of trans-fats which is the worst thing to clog up our arteries. Veggie cheese is OK which can be found in the fresh produce section at [employer] or [employer] or [employer]  +++++++++++++++++++++ DASH Eating Plan  DASH stands for "Dietary Approaches to Stop Hypertension."   The DASH eating plan is a healthy eating plan that has been shown to reduce high blood pressure (hypertension). Additional health benefits may include reducing the risk of type 2 diabetes mellitus, heart disease, and stroke. The DASH eating plan may also help with weight loss. WHAT DO I NEED TO KNOW ABOUT THE DASH EATING PLAN? For the DASH eating plan, you will follow these general guidelines:  Choose foods with a percent daily value for sodium of less than 5% (as listed on the food label).  Use salt-free seasonings or herbs instead of table salt or sea salt.  Check with your health care provider or pharmacist before using salt substitutes.  Eat lower-sodium products, often labeled as "lower sodium" or "no salt added."  Eat fresh foods.  Eat more vegetables, fruits, and low-fat dairy products.  Choose whole grains. Look for the word "whole" as the first word in the ingredient list.  Choose fish   Limit sweets, desserts, sugars, and sugary drinks.  Choose heart-healthy fats.  Eat veggie cheese   Eat more home-cooked food and less restaurant, buffet, and fast food.  Limit fried foods.  Cook foods using methods other than frying.  Limit canned vegetables. If you do use them, rinse them well to decrease the sodium.  When eating at a  restaurant, ask that your food be prepared with less salt, or no salt if possible.  WHAT FOODS CAN I EAT? Read Dr Fara Olden Fuhrman's books on The End of Dieting & The End of Diabetes  Grains Whole grain or whole wheat bread. Brown rice. Whole grain or whole wheat pasta. Quinoa, bulgur, and whole grain cereals. Low-sodium cereals. Corn or whole wheat flour tortillas. Whole grain cornbread. Whole grain crackers. Low-sodium crackers.  Vegetables Fresh or frozen vegetables (raw, steamed, roasted, or grilled). Low-sodium or reduced-sodium tomato and vegetable juices. Low-sodium or reduced-sodium tomato sauce and paste. Low-sodium or reduced-sodium canned vegetables.   Fruits All fresh, canned (in natural juice), or frozen fruits.  Protein Products  All fish and seafood.  Dried beans, peas, or lentils. Unsalted nuts and seeds. Unsalted canned beans.  Dairy Low-fat dairy products, such as skim or 1% milk, 2% or reduced-fat cheeses, low-fat ricotta or cottage cheese, or plain low-fat yogurt. Low-sodium or reduced-sodium cheeses.  Fats and Oils Tub margarines without trans fats. Light or reduced-fat mayonnaise and salad dressings (reduced sodium). Avocado. Safflower, olive, or canola oils. Natural peanut or almond butter.  Other Unsalted popcorn and pretzels. The items listed above may not be a complete list of recommended foods or beverages. Contact your dietitian for more options.  +++++++++++++++  WHAT FOODS ARE NOT RECOMMENDED? Grains/ White flour or wheat flour White bread. White pasta. White rice. Refined cornbread. Bagels and croissants. Crackers that contain trans fat.  Vegetables  Creamed or fried vegetables. Vegetables in a . Regular canned vegetables. Regular canned tomato sauce and paste. Regular tomato and vegetable juices.  Fruits Dried fruits. Canned fruit in light or heavy syrup. Fruit juice.  Meat and Other Protein Products Meat in general - RED meat &  White meat.  Fatty cuts of meat. Ribs, chicken wings, all processed meats as bacon, sausage, bologna, salami, fatback, hot dogs, bratwurst and packaged luncheon meats.  Dairy Whole or 2% milk, cream, half-and-half, and cream cheese. Whole-fat or sweetened yogurt. Full-fat cheeses or blue cheese. Non-dairy creamers and whipped toppings. Processed cheese, cheese spreads, or cheese curds.  Condiments Onion and garlic salt, seasoned salt, table salt, and sea salt. Canned and packaged gravies. Worcestershire sauce. Tartar sauce. Barbecue sauce. Teriyaki sauce. Soy sauce, including reduced sodium. Steak sauce. Fish sauce. Oyster sauce. Cocktail sauce. Horseradish. Ketchup and mustard. Meat flavorings and tenderizers. Bouillon cubes. Hot sauce. Tabasco sauce. Marinades. Taco seasonings. Relishes.  Fats and Oils Butter, stick margarine, lard, shortening and bacon fat. Coconut, palm kernel, or palm oils. Regular salad dressings.  Pickles and olives. Salted popcorn and pretzels.  The items listed above may not be a complete list of foods and beverages to avoid.

## 2017-10-06 NOTE — Progress Notes (Signed)
This very nice 62 y.o. DWF presents for 6 month follow up with HTN, HLD, Pre-Diabetes and Vitamin D Deficiency.  She has hx/o Gout controlled on meds. Today she's also c/o 3-4 week hx/o intermittent high pitched tinnitus and also is describing positional vertigo with recumbency. Has used decongestants sporadically.      Patient is treated for HTN (2000) & BP has been controlled at home. Today's BP is at goal - 124/82. Patient has had no complaints of any cardiac type chest pain, palpitations, dyspnea / orthopnea / PND, dizziness, claudication, or dependent edema. Patient's Gout is controlled on Allopurinol.      Hyperlipidemia is controlled with diet & meds. Patient denies myalgias or other med SE's. Last Lipids were at goal: Lab Results  Component Value Date   CHOL 187 10/06/2017   HDL 67 10/06/2017   LDLCALC 100 (H) 10/06/2017   TRIG 100 10/06/2017   CHOLHDL 2.8 10/06/2017      Also, the patient has history of  Abnormal Glucose and is followed expectantly for PreDiabetes. She has long hx/o a peripheral sensory neuropathy, but has had no symptoms of reactive hypoglycemia, diabetic polys or visual blurring.  Last A1c was Normal & at goal: Lab Results  Component Value Date   HGBA1C 4.7 04/14/2017      Further, the patient also has history of Vitamin D Deficiency ("26"/2008)  and supplements vitamin D without any suspected side-effects. Last vitamin D was at goal:   Lab Results  Component Value Date   VD25OH 86 04/14/2017   Current Outpatient Medications on File Prior to Visit  Medication Sig  . allopurinol (ZYLOPRIM) 300 MG tablet Take 0.5 tablets (150 mg total) by mouth daily.  . AMITIZA 8 MCG capsule Take 1 capsule (8 mcg total) by mouth 2 (two) times daily as needed for constipation.  Marland Kitchen aspirin 81 MG tablet Take 81 mg by mouth every other day.   . bumetanide (BUMEX) 2 MG tablet Take 1 tablet (2 mg total) by mouth 2 (two) times daily.  . Cholecalciferol (VITAMIN D3) 5000 units  CAPS Takes 1 cap daily  . colchicine 0.6 MG tablet Take 1 tablet (0.6 mg total) by mouth daily.  . fluticasone (FLONASE) 50 MCG/ACT nasal spray Place 1 spray into both nostrils daily. (Patient taking differently: Place 1 spray into both nostrils as needed. )  . loratadine (CLARITIN) 10 MG tablet Take 1 tablet (10 mg total) by mouth daily as needed for allergies.  Marland Kitchen lubiprostone (AMITIZA) 8 MCG capsule Take 1 capsule (8 mcg total) by mouth 2 (two) times daily with a meal.  . Magnesium 250 MG TABS Take 2 tablets by mouth 2 (two) times daily.  . montelukast (SINGULAIR) 10 MG tablet Take 1 tablet daily for allergies  . Multiple Vitamin (MULTIVITAMIN) tablet Take 1 tablet by mouth daily.  . pantoprazole (PROTONIX) 40 MG tablet TAKE 1 TABLET DAILY FOR  ACID REFLUX & INDIGESTION  . potassium chloride SA (KLOR-CON M20) 20 MEQ tablet Take 1 tablet (20 mEq total) by mouth 2 (two) times daily.  . ranitidine (ZANTAC) 300 MG tablet TAKE 1 TABLET DAILY FOR    ACID REFLUX  . topiramate (TOPAMAX) 50 MG tablet Take 1 tablet (50 mg total) by mouth 2 (two) times daily.   No current facility-administered medications on file prior to visit.    Allergies  Allergen Reactions  . Alprazolam Other (See Comments)     Unknown reaction per pt  . Prednisone  Only high doses  . Allopurinol Rash   PMHx:   Past Medical History:  Diagnosis Date  . Abnormal glucose 02/05/2015  . Allergy   . Contact lens/glasses fitting    wears contacts or glasses  . GERD (gastroesophageal reflux disease)   . Hyperlipidemia   . Hypertension   . Hyperuricemia 08/13/2014  . Irritable bowel syndrome 10/18/2007  . Irritable bowel syndrome (IBS)   . Medication management 12/26/2013  . Peripheral neuropathy    in feet  . POLYP, COLON 10/18/2007  . PONV (postoperative nausea and vomiting)    nausea, slow to wake up at times  . Vitamin D deficiency    Immunization History  Administered Date(s) Administered  . PPD Test 01/25/2014,  02/05/2015, 02/27/2016  . Pneumococcal Polysaccharide-23 11/23/2008  . Tdap 11/29/2009   Past Surgical History:  Procedure Laterality Date  . carpel tunnel surgery Right   . CHOLECYSTECTOMY  04/14/2012   Procedure: LAPAROSCOPIC CHOLECYSTECTOMY;  Surgeon: Rolm Bookbinder, MD;  Location: [place];  Service: General;  Laterality: N/A;  . COLONOSCOPY    . EYE SURGERY     at age 57yr  . FOOT SURGERY     both feet - little toes on each foot  . goiter removed fro thyroid    . OPEN REDUCTION INTERNAL FIXATION (ORIF) DISTAL RADIAL FRACTURE Right 07/04/2015   Procedure: OPEN REDUCTION INTERNAL FIXATION (ORIF) RIGHT DISTAL RADIUS FRACTURE;  Surgeon: Iran Planas, MD;  Location: [place];  Service: Orthopedics;  Laterality: Right;  . WISDOM TOOTH EXTRACTION     FHx:    Reviewed / unchanged  SHx:    Reviewed / unchanged  Systems Review:  Constitutional: Denies fever, chills, wt changes, headaches, insomnia, fatigue, night sweats, change in appetite. Eyes: Denies redness, blurred vision, diplopia, discharge, itchy, watery eyes.  ENT: Denies discharge, congestion, post nasal drip, epistaxis, sore throat, earache, hearing loss, dental pain, tinnitus, vertigo, sinus pain, snoring.  CV: Denies chest pain, palpitations, irregular heartbeat, syncope, dyspnea, diaphoresis, orthopnea, PND, claudication or edema. Respiratory: denies cough, dyspnea, DOE, pleurisy, hoarseness, laryngitis, wheezing.  Gastrointestinal: Denies dysphagia, odynophagia, heartburn, reflux, water brash, abdominal pain or cramps, nausea, vomiting, bloating, diarrhea, constipation, hematemesis, melena, hematochezia  or hemorrhoids. Genitourinary: Denies dysuria, frequency, urgency, nocturia, hesitancy, discharge, hematuria or flank pain. Musculoskeletal: Denies arthralgias, myalgias, stiffness, jt. swelling, pain, limping or strain/sprain.  Skin: Denies pruritus, rash, hives, warts, acne, eczema or change in skin  lesion(s). Neuro: No weakness, tremor, incoordination, spasms, paresthesia or pain. Psychiatric: Denies confusion, memory loss or sensory loss. Endo: Denies change in weight, skin or hair change.  Heme/Lymph: No excessive bleeding, bruising or enlarged lymph nodes.  Physical Exam  BP 124/82   Pulse 60   Temp (!) 97.2 F (36.2 C)   Resp 16   Ht 5\' 7"  (1.702 m)   Wt 144 lb 3.2 oz (65.4 kg)   BMI 22.58 kg/m   Appears  well nourished, well groomed  and in no distress.  Eyes: PERRLA, EOMs, conjunctiva no swelling or erythema. Sinuses: No frontal/maxillary tenderness ENT/Mouth: EAC's clear, TM's sl. retracted w/o erythema, bulging. Nares clear w/o erythema, swelling, exudates. Oropharynx clear without erythema or exudates. Oral hygiene is good. Tongue normal, non obstructing. Hearing intact.  Neck: Supple. Thyroid not palpable. Car 2+/2+ without bruits, nodes or JVD. Chest: Respirations nl with BS clear & equal w/o rales, rhonchi, wheezing or stridor.  Cor: Heart sounds normal w/ regular rate and rhythm without sig. murmurs, gallops, clicks or rubs.  Peripheral pulses normal and equal  without edema.  Abdomen: Soft & bowel sounds normal. Non-tender w/o guarding, rebound, hernias, masses or organomegaly.  Lymphatics: Unremarkable.  Musculoskeletal: Full ROM all peripheral extremities, joint stability, 5/5 strength and normal gait.  Skin: Warm, dry without exposed rashes, lesions or ecchymosis apparent.  Neuro: Cranial nerves intact, reflexes equal bilaterally. Sensory-motor testing grossly intact. Tendon reflexes grossly intact.  Pysch: Alert & oriented x 3.  Insight and judgement nl & appropriate. No ideations.  Assessment and Plan:  1. Essential hypertension  - Continue medication, monitor blood pressure at home.  - Continue DASH diet.  Reminder to go to the ER if any CP,  SOB, nausea, dizziness, severe HA, changes vision/speech.  - CBC with Differential/Platelet - BASIC METABOLIC  PANEL WITH GFR - Magnesium - TSH  2. Hyperlipidemia, mixed  - Continue diet/meds, exercise,& lifestyle modifications.  - Continue monitor periodic cholesterol/liver & renal functions   - Hepatic function panel - Lipid panel - TSH  3. Abnormal glucose  - Continue diet, exercise, lifestyle modifications.  - Monitor appropriate labs.  - Hemoglobin A1c - Insulin, random  4. Vitamin D deficiency  - Continue supplementation.   - VITAMIN D 25 Hydroxyl  5. Hyperuricemia  - Uric acid  6. Medication management  - CBC with Differential/Platelet - BASIC METABOLIC PANEL WITH GFR - Hepatic function panel - Magnesium - Lipid panel - TSH - Hemoglobin A1c - Insulin, random - VITAMIN D 25 Hydroxyl - Uric acid  7. Vertigo  - dexamethasone (DECADRON) 0.5 MG tablet; Take 1 tab 3 x day - 3 days, then 2 x day - 3 days, then 1 tab daily  Dispense: 20 tablet; Refill: 0  8. Bilateral serous otitis media  - dexamethasone (DECADRON) 0.5 MG tablet; Take 1 tab 3 x day - 3 days, then 2 x day - 3 days, then 1 tab daily  Dispense: 20 tablet; Refill: 0          Discussed  regular exercise, BP monitoring, weight control to achieve/maintain BMI less than 25 and discussed med and SE's. Recommended labs to assess and monitor clinical status with further disposition pending results of labs. Over 30 minutes of exam, counseling, chart review was performed.

## 2017-10-07 LAB — BASIC METABOLIC PANEL WITH GFR
BUN / CREAT RATIO: 19 (calc) (ref 6–22)
BUN: 21 mg/dL (ref 7–25)
CHLORIDE: 102 mmol/L (ref 98–110)
CO2: 32 mmol/L (ref 20–32)
Calcium: 10 mg/dL (ref 8.6–10.4)
Creat: 1.11 mg/dL — ABNORMAL HIGH (ref 0.50–0.99)
GFR, EST AFRICAN AMERICAN: 62 mL/min/{1.73_m2} (ref >=60)
GFR, Est Non African American: 53 mL/min/{1.73_m2} — ABNORMAL LOW (ref >=60)
Glucose, Bld: 83 mg/dL (ref 65–99)
Potassium: 3.5 mmol/L (ref 3.5–5.3)
Sodium: 141 mmol/L (ref 135–146)

## 2017-10-07 LAB — HEPATIC FUNCTION PANEL
AG RATIO: 1.7 (calc) (ref 1.0–2.5)
ALT: 21 U/L (ref 6–29)
AST: 30 U/L (ref 10–35)
Albumin: 4.7 g/dL (ref 3.6–5.1)
Alkaline phosphatase (APISO): 68 U/L (ref 33–130)
BILIRUBIN DIRECT: 0.2 mg/dL (ref 0.0–0.2)
BILIRUBIN INDIRECT: 0.5 mg/dL (ref 0.2–1.2)
Globulin: 2.7 g/dL (calc) (ref 1.9–3.7)
Total Bilirubin: 0.7 mg/dL (ref 0.2–1.2)
Total Protein: 7.4 g/dL (ref 6.1–8.1)

## 2017-10-07 LAB — LIPID PANEL
CHOL/HDL RATIO: 2.8 (calc) (ref <5.0)
Cholesterol: 187 mg/dL (ref <200)
HDL: 67 mg/dL (ref >50)
LDL CHOLESTEROL (CALC): 100 mg/dL — AB
Non-HDL Cholesterol (Calc): 120 mg/dL (calc) (ref <130)
Triglycerides: 100 mg/dL (ref <150)

## 2017-10-07 LAB — CBC WITH DIFFERENTIAL/PLATELET
BASOS ABS: 101 {cells}/uL (ref 0–200)
BASOS PCT: 1.3 %
EOS ABS: 78 {cells}/uL (ref 15–500)
Eosinophils Relative: 1 %
HCT: 40.1 % (ref 35.0–45.0)
Hemoglobin: 14.2 g/dL (ref 11.7–15.5)
Lymphs Abs: 3081 cells/uL (ref 850–3900)
MCH: 32.9 pg (ref 27.0–33.0)
MCHC: 35.4 g/dL (ref 32.0–36.0)
MCV: 92.8 fL (ref 80.0–100.0)
MPV: 11.7 fL (ref 7.5–12.5)
Monocytes Relative: 7 %
NEUTROS PCT: 51.2 %
Neutro Abs: 3994 cells/uL (ref 1500–7800)
PLATELETS: 363 10*3/uL (ref 140–400)
RBC: 4.32 10*6/uL (ref 3.80–5.10)
RDW: 12.1 % (ref 11.0–15.0)
TOTAL LYMPHOCYTE: 39.5 %
WBC: 7.8 10*3/uL (ref 3.8–10.8)
WBCMIX: 546 {cells}/uL (ref 200–950)

## 2017-10-07 LAB — HEMOGLOBIN A1C
Hgb A1c MFr Bld: 4.9 % of total Hgb (ref <5.7)
Mean Plasma Glucose: 94 (calc)
eAG (mmol/L): 5.2 (calc)

## 2017-10-07 LAB — URIC ACID: URIC ACID, SERUM: 5.3 mg/dL (ref 2.5–7.0)

## 2017-10-07 LAB — MAGNESIUM: MAGNESIUM: 2.3 mg/dL (ref 1.5–2.5)

## 2017-10-07 LAB — VITAMIN D 25 HYDROXY (VIT D DEFICIENCY, FRACTURES): Vit D, 25-Hydroxy: 93 ng/mL (ref 30–100)

## 2017-10-07 LAB — TSH: TSH: 1.76 m[IU]/L (ref 0.40–4.50)

## 2017-10-07 LAB — INSULIN, RANDOM: Insulin: 7.9 u[IU]/mL (ref 2.0–19.6)

## 2017-10-12 ENCOUNTER — Other Ambulatory Visit: Payer: Self-pay | Admitting: Physician Assistant

## 2017-10-22 ENCOUNTER — Ambulatory Visit: Payer: Self-pay | Admitting: Internal Medicine

## 2017-11-11 ENCOUNTER — Other Ambulatory Visit: Payer: Self-pay | Admitting: Internal Medicine

## 2017-12-02 ENCOUNTER — Other Ambulatory Visit: Payer: Self-pay | Admitting: Obstetrics and Gynecology

## 2017-12-02 DIAGNOSIS — Z1231 Encounter for screening mammogram for malignant neoplasm of breast: Secondary | ICD-10-CM

## 2018-01-04 ENCOUNTER — Ambulatory Visit
Admission: RE | Admit: 2018-01-04 | Discharge: 2018-01-04 | Disposition: A | Discharge status: Home / Self Care | Payer: 59 | Source: Ambulatory Visit | Attending: Obstetrics and Gynecology | Admitting: Obstetrics and Gynecology

## 2018-01-04 DIAGNOSIS — Z1231 Encounter for screening mammogram for malignant neoplasm of breast: Secondary | ICD-10-CM

## 2018-01-06 NOTE — Progress Notes (Signed)
FOLLOW UP  Assessment and Plan:   Hypertension Well controlled with current medications  Monitor blood pressure at home; patient to call if consistently greater than 130/80 Continue DASH diet.   Reminder to go to the ER if any CP, SOB, nausea, dizziness, severe HA, changes vision/speech, left arm numbness and tingling and jaw pain.  Cholesterol Currently at goal off of medications Continue low cholesterol diet and exercise.  Defer lipid panel.   History of abnormal glucose Continue diet and exercise.  Perform daily foot/skin check, notify office of any concerning changes.  Defer A1C; annual check sufficient; check CMP  Vitamin D Def At goal at last visit; continue supplementation to maintain goal of 70-100 Defer Vit D level  GERD Well managed on current medications  Discussed diet, avoiding triggers and other lifestyle changes  Gout Continue allopurinol Diet discussed Check uric acid as needed  IBS with both diarrhea and constipation If not on benefiber then add it, decrease stress,  if any worsening symptoms, blood in stool, AB pain, etc call office FODMAP information provided; patient to keep food journal  Continue diet and meds as discussed. Further disposition pending results of labs. Discussed med's effects and SE's.   Over 30 minutes of exam, counseling, chart review, and critical decision making was performed.   Future Appointments  Date Time Provider [place]  04/21/2018  3:00 PM Unk Pinto, MD [facility] None    ----------------------------------------------------------------------------------------------------------------------  HPI 62 y.o. female  presents for 3 month follow up on hypertension, cholesterol, history of abnormal glucose, and vitamin D deficiency. She also has ongoing issues with GI, + hx with GERD, IBS-D with vague abdominal symptoms. She reports symptoms are improving since last visit, typically has constipation but recently  improved. She has sennakot at home but hasn't felt she needed it.   She reports she is having brief dizziness when she turns over in bed at night.   she has a diagnosis of GERD which is currently managed by protonix 40 mg (previously on ranitidine).  she reports symptoms is currently well controlled, and denies breakthrough reflux, burning in chest, hoarseness or cough.    BMI is Body mass index is 22.08 kg/m., she has been working on diet but not exercise. Wt Readings from Last 3 Encounters:  01/07/18 141 lb (64 kg)  10/06/17 144 lb 3.2 oz (65.4 kg)  09/03/17 140 lb (63.5 kg)   Her blood pressure has been controlled at home, today their BP is BP: 120/72  She does not workout. She denies chest pain, shortness of breath, dizziness.   She is not on cholesterol medication and denies myalgias. Her cholesterol is at goal. The cholesterol last visit was:   Lab Results  Component Value Date   CHOL 187 10/06/2017   HDL 67 10/06/2017   LDLCALC 100 (H) 10/06/2017   TRIG 100 10/06/2017   CHOLHDL 2.8 10/06/2017    She has been working on diet and exercise for glucose management, and denies increased appetite, nausea, paresthesia of the feet, polydipsia, polyuria, visual disturbances and vomiting. Last A1C in the office was:  Lab Results  Component Value Date   HGBA1C 4.9 10/06/2017   Patient is on Vitamin D supplement and at goal at recent check:   Lab Results  Component Value Date   VD25OH 93 10/06/2017     Patient is on allopurinol for gout and does not report a recent flare.  Lab Results  Component Value Date   LABURIC 5.3 10/06/2017  Current Medications:  Current Outpatient Medications on File Prior to Visit  Medication Sig  . allopurinol (ZYLOPRIM) 300 MG tablet Take 0.5 tablets (150 mg total) by mouth daily.  Marland Kitchen aspirin 81 MG tablet Take 81 mg by mouth every other day.   . bumetanide (BUMEX) 2 MG tablet TAKE 1 TABLET BY MOUTH TWO  TIMES DAILY  . Cholecalciferol (VITAMIN  D3) 5000 units CAPS Takes 1 cap daily  . fluticasone (FLONASE) 50 MCG/ACT nasal spray Place 1 spray into both nostrils daily. (Patient taking differently: Place 1 spray into both nostrils as needed. )  . loratadine (CLARITIN) 10 MG tablet Take 1 tablet (10 mg total) by mouth daily as needed for allergies.  . Magnesium 250 MG TABS Take 2 tablets by mouth 2 (two) times daily.  . montelukast (SINGULAIR) 10 MG tablet Take 1 tablet daily for allergies  . Multiple Vitamin (MULTIVITAMIN) tablet Take 1 tablet by mouth daily.  . pantoprazole (PROTONIX) 40 MG tablet TAKE 1 TABLET DAILY FOR  ACID REFLUX & INDIGESTION  . potassium chloride SA (K-DUR,KLOR-CON) 20 MEQ tablet TAKE 1 TABLET BY MOUTH TWO  TIMES DAILY  . topiramate (TOPAMAX) 50 MG tablet TAKE 1 TABLET BY MOUTH TWO  TIMES DAILY   No current facility-administered medications on file prior to visit.      Allergies:  Allergies  Allergen Reactions  . Alprazolam Other (See Comments)     Unknown reaction per pt  . Prednisone     Only high doses     Medical History:  Past Medical History:  Diagnosis Date  . Abnormal glucose 02/05/2015  . Allergy   . Contact lens/glasses fitting    wears contacts or glasses  . GERD (gastroesophageal reflux disease)   . Hyperlipidemia   . Hypertension   . Hyperuricemia 08/13/2014  . Irritable bowel syndrome 10/18/2007  . Irritable bowel syndrome (IBS)   . Medication management 12/26/2013  . Peripheral neuropathy    in feet  . POLYP, COLON 10/18/2007  . PONV (postoperative nausea and vomiting)    nausea, slow to wake up at times  . Vitamin D deficiency    Family history- Reviewed and unchanged Social history- Reviewed and unchanged   Review of Systems:  Review of Systems  Constitutional: Negative for malaise/fatigue and weight loss.  HENT: Negative for hearing loss and tinnitus.   Eyes: Negative for blurred vision and double vision.  Respiratory: Negative for cough, shortness of breath and  wheezing.   Cardiovascular: Negative for chest pain, palpitations, orthopnea, claudication and leg swelling.  Gastrointestinal: Positive for constipation. Negative for abdominal pain, blood in stool, diarrhea, heartburn, melena, nausea and vomiting.  Genitourinary: Negative.   Musculoskeletal: Negative for joint pain and myalgias.  Skin: Negative for rash.  Neurological: Negative for dizziness, tingling, sensory change, weakness and headaches.  Endo/Heme/Allergies: Negative for polydipsia.  Psychiatric/Behavioral: Negative.   All other systems reviewed and are negative.   Physical Exam: BP 120/72   Pulse (!) 54   Temp (!) 97.3 F (36.3 C)   Ht 5\' 7"  (1.702 m)   Wt 141 lb (64 kg)   SpO2 99%   BMI 22.08 kg/m  Wt Readings from Last 3 Encounters:  01/07/18 141 lb (64 kg)  10/06/17 144 lb 3.2 oz (65.4 kg)  09/03/17 140 lb (63.5 kg)   General Appearance: Well nourished, in no apparent distress. Eyes: PERRLA, EOMs, conjunctiva no swelling or erythema Sinuses: No Frontal/maxillary tenderness ENT/Mouth: Ext aud canals clear, TMs without erythema, bulging.  No erythema, swelling, or exudate on post pharynx.  Tonsils not swollen or erythematous. Hearing normal.  Neck: Supple, thyroid normal.  Respiratory: Respiratory effort normal, BS equal bilaterally without rales, rhonchi, wheezing or stridor.  Cardio: RRR with no MRGs. Brisk peripheral pulses without edema.  Abdomen: Soft, + BS.  Non tender, no guarding, rebound, hernias, masses. Lymphatics: Non tender without lymphadenopathy.  Musculoskeletal: Full ROM, 5/5 strength, Normal gait Skin: Warm, dry without rashes, lesions, ecchymosis.  Neuro: Cranial nerves intact. No cerebellar symptoms.  Psych: Awake and oriented X 3, normal affect, Insight and Judgment appropriate.    [name], NP 4:13 PM [facility]

## 2018-01-07 ENCOUNTER — Encounter: Payer: Self-pay | Admitting: Adult Health

## 2018-01-07 ENCOUNTER — Ambulatory Visit: Payer: 59 | Admitting: Adult Health

## 2018-01-07 VITALS — BP 120/72 | HR 54 | Temp 97.3°F | Ht 67.0 in | Wt 141.0 lb

## 2018-01-07 DIAGNOSIS — K582 Mixed irritable bowel syndrome: Secondary | ICD-10-CM

## 2018-01-07 DIAGNOSIS — E782 Mixed hyperlipidemia: Secondary | ICD-10-CM

## 2018-01-07 DIAGNOSIS — Z6822 Body mass index (BMI) 22.0-22.9, adult: Secondary | ICD-10-CM

## 2018-01-07 DIAGNOSIS — E559 Vitamin D deficiency, unspecified: Secondary | ICD-10-CM

## 2018-01-07 DIAGNOSIS — Z79899 Other long term (current) drug therapy: Secondary | ICD-10-CM | POA: Diagnosis not present

## 2018-01-07 DIAGNOSIS — K219 Gastro-esophageal reflux disease without esophagitis: Secondary | ICD-10-CM | POA: Diagnosis not present

## 2018-01-07 DIAGNOSIS — I10 Essential (primary) hypertension: Secondary | ICD-10-CM

## 2018-01-07 DIAGNOSIS — E790 Hyperuricemia without signs of inflammatory arthritis and tophaceous disease: Secondary | ICD-10-CM

## 2018-01-07 NOTE — Patient Instructions (Signed)
Aim for 7+ servings of fruits and vegetables daily  80+ fluid ounces of water or unsweet tea for healthy kidneys  Limit animal fats in diet for cholesterol and heart health - choose grass fed whenever available  Aim for low stress - take time to unwind and care for your mental health  Aim for 150 min of moderate intensity exercise weekly for heart health, and weights twice weekly for bone health  Aim for 7-9 hours of sleep daily   Benign Positional Vertigo Vertigo is the feeling that you or your surroundings are moving when they are not. Benign positional vertigo is the most common form of vertigo. The cause of this condition is not serious (is benign). This condition is triggered by certain movements and positions (is positional). This condition can be dangerous if it occurs while you are doing something that could endanger you or others, such as driving. What are the causes? In many cases, the cause of this condition is not known. It may be caused by a disturbance in an area of the inner ear that helps your brain to sense movement and balance. This disturbance can be caused by a viral infection (labyrinthitis), head injury, or repetitive motion. What increases the risk? This condition is more likely to develop in:  Women.  People who are 31 years of age or older.  What are the signs or symptoms? Symptoms of this condition usually happen when you move your head or your eyes in different directions. Symptoms may start suddenly, and they usually last for less than a minute. Symptoms may include:  Loss of balance and falling.  Feeling like you are spinning or moving.  Feeling like your surroundings are spinning or moving.  Nausea and vomiting.  Blurred vision.  Dizziness.  Involuntary eye movement (nystagmus).  Symptoms can be mild and cause only slight annoyance, or they can be severe and interfere with daily life. Episodes of benign positional vertigo may return (recur) over  time, and they may be triggered by certain movements. Symptoms may improve over time. How is this diagnosed? This condition is usually diagnosed by medical history and a physical exam of the head, neck, and ears. You may be referred to a health care provider who specializes in ear, nose, and throat (ENT) problems (otolaryngologist) or a provider who specializes in disorders of the nervous system (neurologist). You may have additional testing, including:  MRI.  A CT scan.  Eye movement tests. Your health care provider may ask you to change positions quickly while he or she watches you for symptoms of benign positional vertigo, such as nystagmus. Eye movement may be tested with an electronystagmogram (ENG), caloric stimulation, the Dix-Hallpike test, or the roll test.  An electroencephalogram (EEG). This records electrical activity in your brain.  Hearing tests.  How is this treated? Usually, your health care provider will treat this by moving your head in specific positions to adjust your inner ear back to normal. Surgery may be needed in severe cases, but this is rare. In some cases, benign positional vertigo may resolve on its own in 2-4 weeks. Follow these instructions at home: Safety  Move slowly.Avoid sudden body or head movements.  Avoid driving.  Avoid operating heavy machinery.  Avoid doing any tasks that would be dangerous to you or others if a vertigo episode would occur.  If you have trouble walking or keeping your balance, try using a cane for stability. If you feel dizzy or unstable, sit down right away.  Return to your normal activities as told by your health care provider. Ask your health care provider what activities are safe for you. General instructions  Take over-the-counter and prescription medicines only as told by your health care provider.  Avoid certain positions or movements as told by your health care provider.  Drink enough fluid to keep your urine clear  or pale yellow.  Keep all follow-up visits as told by your health care provider. This is important. Contact a health care provider if:  You have a fever.  Your condition gets worse or you develop new symptoms.  Your family or friends notice any behavioral changes.  Your nausea or vomiting gets worse.  You have numbness or a "pins and needles" sensation. Get help right away if:  You have difficulty speaking or moving.  You are always dizzy.  You faint.  You develop severe headaches.  You have weakness in your legs or arms.  You have changes in your hearing or vision.  You develop a stiff neck.  You develop sensitivity to light. This information is not intended to replace advice given to you by your health care provider. Make sure you discuss any questions you have with your health care provider. Document Released: 03/17/2006 Document Revised: 11/15/2015 Document Reviewed: 10/02/2014 Elsevier Interactive Patient Education  [employer].

## 2018-01-08 LAB — COMPLETE METABOLIC PANEL WITH GFR
AG RATIO: 1.7 (calc) (ref 1.0–2.5)
ALKALINE PHOSPHATASE (APISO): 79 U/L (ref 33–130)
ALT: 18 U/L (ref 6–29)
AST: 29 U/L (ref 10–35)
Albumin: 4.7 g/dL (ref 3.6–5.1)
BUN/Creatinine Ratio: 22 (calc) (ref 6–22)
BUN: 28 mg/dL — ABNORMAL HIGH (ref 7–25)
CO2: 32 mmol/L (ref 20–32)
Calcium: 10.2 mg/dL (ref 8.6–10.4)
Chloride: 99 mmol/L (ref 98–110)
Creat: 1.26 mg/dL — ABNORMAL HIGH (ref 0.50–0.99)
GFR, EST NON AFRICAN AMERICAN: 45 mL/min/{1.73_m2} — AB (ref >=60)
GFR, Est African American: 53 mL/min/{1.73_m2} — ABNORMAL LOW (ref >=60)
GLOBULIN: 2.8 g/dL (ref 1.9–3.7)
Glucose, Bld: 77 mg/dL (ref 65–99)
POTASSIUM: 3.9 mmol/L (ref 3.5–5.3)
SODIUM: 139 mmol/L (ref 135–146)
Total Bilirubin: 0.8 mg/dL (ref 0.2–1.2)
Total Protein: 7.5 g/dL (ref 6.1–8.1)

## 2018-01-08 LAB — CBC WITH DIFFERENTIAL/PLATELET
BASOS ABS: 113 {cells}/uL (ref 0–200)
Basophils Relative: 1.2 %
EOS ABS: 113 {cells}/uL (ref 15–500)
EOS PCT: 1.2 %
HEMATOCRIT: 40.3 % (ref 35.0–45.0)
Hemoglobin: 14.2 g/dL (ref 11.7–15.5)
LYMPHS ABS: 3713 {cells}/uL (ref 850–3900)
MCH: 32.4 pg (ref 27.0–33.0)
MCHC: 35.2 g/dL (ref 32.0–36.0)
MCV: 92 fL (ref 80.0–100.0)
MONOS PCT: 7.2 %
MPV: 11.6 fL (ref 7.5–12.5)
NEUTROS PCT: 50.9 %
Neutro Abs: 4785 cells/uL (ref 1500–7800)
Platelets: 365 10*3/uL (ref 140–400)
RBC: 4.38 10*6/uL (ref 3.80–5.10)
RDW: 12.1 % (ref 11.0–15.0)
Total Lymphocyte: 39.5 %
WBC: 9.4 10*3/uL (ref 3.8–10.8)
WBCMIX: 677 {cells}/uL (ref 200–950)

## 2018-01-08 LAB — TSH: TSH: 1.8 m[IU]/L (ref 0.40–4.50)

## 2018-01-08 LAB — MAGNESIUM: Magnesium: 2.3 mg/dL (ref 1.5–2.5)

## 2018-02-11 ENCOUNTER — Other Ambulatory Visit: Payer: Self-pay | Admitting: Adult Health

## 2018-03-18 LAB — PSA: PSA: NEGATIVE

## 2018-03-23 ENCOUNTER — Encounter: Payer: Self-pay | Admitting: *Deleted

## 2018-03-31 ENCOUNTER — Other Ambulatory Visit: Payer: Self-pay | Admitting: Internal Medicine

## 2018-04-20 ENCOUNTER — Encounter: Payer: Self-pay | Admitting: Internal Medicine

## 2018-04-20 NOTE — Patient Instructions (Signed)
Preventive Care for Adults  A healthy lifestyle and preventive care can promote health and wellness. Preventive health guidelines for women include the following key practices.  A routine yearly physical is a good way to check with your health care provider about your health and preventive screening. It is a chance to share any concerns and updates on your health and to receive a thorough exam.  Visit your dentist for a routine exam and preventive care every 6 months. Brush your teeth twice a day and floss once a day. Good oral hygiene prevents tooth decay and gum disease.  The frequency of eye exams is based on your age, health, family medical history, use of contact lenses, and other factors. Follow your health care provider's recommendations for frequency of eye exams.  Eat a healthy diet. Foods like vegetables, fruits, whole grains, low-fat dairy products, and lean protein foods contain the nutrients you need without too many calories. Decrease your intake of foods high in solid fats, added sugars, and salt. Eat the right amount of calories for you. Get information about a proper diet from your health care provider, if necessary.  Regular physical exercise is one of the most important things you can do for your health. Most adults should get at least 150 minutes of moderate-intensity exercise (any activity that increases your heart rate and causes you to sweat) each week. In addition, most adults need muscle-strengthening exercises on 2 or more days a week.  Maintain a healthy weight. The body mass index (BMI) is a screening tool to identify possible weight problems. It provides an estimate of body fat based on height and weight. Your health care provider can find your BMI and can help you achieve or maintain a healthy weight. For adults 20 years and older:  A BMI below 18.5 is considered underweight.  A BMI of 18.5 to 24.9 is normal.  A BMI of 25 to 29.9 is considered overweight.  A BMI of  30 and above is considered obese.  Maintain normal blood lipids and cholesterol levels by exercising and minimizing your intake of saturated fat. Eat a balanced diet with plenty of fruit and vegetables. Blood tests for lipids and cholesterol should begin at age 53 and be repeated every 5 years. If your lipid or cholesterol levels are high, you are over 50, or you are at high risk for heart disease, you may need your cholesterol levels checked more frequently. Ongoing high lipid and cholesterol levels should be treated with medicines if diet and exercise are not working.  If you smoke, find out from your health care provider how to quit. If you do not use tobacco, do not start.  Lung cancer screening is recommended for adults aged 48-80 years who are at high risk for developing lung cancer because of a history of smoking. A yearly low-dose CT scan of the lungs is recommended for people who have at least a 30-pack-year history of smoking and are a current smoker or have quit within the past 15 years. A pack year of smoking is smoking an average of 1 pack of cigarettes a day for 1 year (for example: 1 pack a day for 30 years or 2 packs a day for 15 years). Yearly screening should continue until the smoker has stopped smoking for at least 15 years. Yearly screening should be stopped for people who develop a health problem that would prevent them from having lung cancer treatment.  High blood pressure causes heart disease and increases  the risk of stroke. Your blood pressure should be checked at least every 1 to 2 years. Ongoing high blood pressure should be treated with medicines if weight loss and exercise do not work.  If you are 63-27 years old, ask your health care provider if you should take aspirin to prevent strokes.  Diabetes screening involves taking a blood sample to check your fasting blood sugar level. This should be done once every 3 years, after age 54, if you are within normal weight and  without risk factors for diabetes. Testing should be considered at a younger age or be carried out more frequently if you are overweight and have at least 1 risk factor for diabetes.  Breast cancer screening is essential preventive care for women. You should practice "breast self-awareness." This means understanding the normal appearance and feel of your breasts and may include breast self-examination. Any changes detected, no matter how small, should be reported to a health care provider. Women in their 26s and 30s should have a clinical breast exam (CBE) by a health care provider as part of a regular health exam every 1 to 3 years. After age 84, women should have a CBE every year. Starting at age 56, women should consider having a mammogram (breast X-ray test) every year. Women who have a family history of breast cancer should talk to their health care provider about genetic screening. Women at a high risk of breast cancer should talk to their health care providers about having an MRI and a mammogram every year.  Breast cancer gene (BRCA)-related cancer risk assessment is recommended for women who have family members with BRCA-related cancers. BRCA-related cancers include breast, ovarian, tubal, and peritoneal cancers. Having family members with these cancers may be associated with an increased risk for harmful changes (mutations) in the breast cancer genes BRCA1 and BRCA2. Results of the assessment will determine the need for genetic counseling and BRCA1 and BRCA2 testing.  Routine pelvic exams to screen for cancer are no longer recommended for nonpregnant women who are considered low risk for cancer of the pelvic organs (ovaries, uterus, and vagina) and who do not have symptoms. Ask your health care provider if a screening pelvic exam is right for you.  If you have had past treatment for cervical cancer or a condition that could lead to cancer, you need Pap tests and screening for cancer for at least 20  years after your treatment. If Pap tests have been discontinued, your risk factors (such as having a new sexual partner) need to be reassessed to determine if screening should be resumed. Some women have medical problems that increase the chance of getting cervical cancer. In these cases, your health care provider may recommend more frequent screening and Pap tests.  Colorectal cancer can be detected and often prevented. Most routine colorectal cancer screening begins at the age of 63 years and continues through age 44 years. However, your health care provider may recommend screening at an earlier age if you have risk factors for colon cancer. On a yearly basis, your health care provider may provide home test kits to check for hidden blood in the stool. Use of a small camera at the end of a tube, to directly examine the colon (sigmoidoscopy or colonoscopy), can detect the earliest forms of colorectal cancer. Talk to your health care provider about this at age 68, when routine screening begins.  Direct exam of the colon should be repeated every 5-10 years through age 44 years, unless  early forms of pre-cancerous polyps or small growths are found.  Hepatitis C blood testing is recommended for all people born from 1945 through 1965 and any individual with known risks for hepatitis C.  Pra  Osteoporosis is a disease in which the bones lose minerals and strength with aging. This can result in serious bone fractures or breaks. The risk of osteoporosis can be identified using a bone density scan. Women ages 65 years and over and women at risk for fractures or osteoporosis should discuss screening with their health care providers. Ask your health care provider whether you should take a calcium supplement or vitamin D to reduce the rate of osteoporosis.  Menopause can be associated with physical symptoms and risks. Hormone replacement therapy is available to decrease symptoms and risks. You should talk to your  health care provider about whether hormone replacement therapy is right for you.  Use sunscreen. Apply sunscreen liberally and repeatedly throughout the day. You should seek shade when your shadow is shorter than you. Protect yourself by wearing long sleeves, pants, a wide-brimmed hat, and sunglasses year round, whenever you are outdoors.  Once a month, do a whole body skin exam, using a mirror to look at the skin on your back. Tell your health care provider of new moles, moles that have irregular borders, moles that are larger than a pencil eraser, or moles that have changed in shape or color.  Stay current with required vaccines (immunizations).  Influenza vaccine. All adults should be immunized every year.  Tetanus, diphtheria, and acellular pertussis (Td, Tdap) vaccine. Pregnant women should receive 1 dose of Tdap vaccine during each pregnancy. The dose should be obtained regardless of the length of time since the last dose. Immunization is preferred during the 27th-36th week of gestation. An adult who has not previously received Tdap or who does not know her vaccine status should receive 1 dose of Tdap. This initial dose should be followed by tetanus and diphtheria toxoids (Td) booster doses every 10 years. Adults with an unknown or incomplete history of completing a 3-dose immunization series with Td-containing vaccines should begin or complete a primary immunization series including a Tdap dose. Adults should receive a Td booster every 10 years.  Varicella vaccine. An adult without evidence of immunity to varicella should receive 2 doses or a second dose if she has previously received 1 dose. Pregnant females who do not have evidence of immunity should receive the first dose after pregnancy. This first dose should be obtained before leaving the health care facility. The second dose should be obtained 4-8 weeks after the first dose.  Human papillomavirus (HPV) vaccine. Females aged 13-26 years  who have not received the vaccine previously should obtain the 3-dose series. The vaccine is not recommended for use in pregnant females. However, pregnancy testing is not needed before receiving a dose. If a female is found to be pregnant after receiving a dose, no treatment is needed. In that case, the remaining doses should be delayed until after the pregnancy. Immunization is recommended for any person with an immunocompromised condition through the age of 26 years if she did not get any or all doses earlier. During the 3-dose series, the second dose should be obtained 4-8 weeks after the first dose. The third dose should be obtained 24 weeks after the first dose and 16 weeks after the second dose.  Zoster vaccine. One dose is recommended for adults aged 60 years or older unless certain conditions are present.    Measles, mumps, and rubella (MMR) vaccine. Adults born before [birth year] generally are considered immune to measles and mumps. Adults born in [birth year] or later should have 1 or more doses of MMR vaccine unless there is a contraindication to the vaccine or there is laboratory evidence of immunity to each of the three diseases. A routine second dose of MMR vaccine should be obtained at least 28 days after the first dose for students attending postsecondary schools, health care workers, or international travelers. People who received inactivated measles vaccine or an unknown type of measles vaccine during 1963-1967 should receive 2 doses of MMR vaccine. People who received inactivated mumps vaccine or an unknown type of mumps vaccine before 1979 and are at high risk for mumps infection should consider immunization with 2 doses of MMR vaccine. For females of childbearing age, rubella immunity should be determined. If there is no evidence of immunity, females who are not pregnant should be vaccinated. If there is no evidence of immunity, females who are pregnant should delay immunization until after pregnancy.  Unvaccinated health care workers born before [birth year] who lack laboratory evidence of measles, mumps, or rubella immunity or laboratory confirmation of disease should consider measles and mumps immunization with 2 doses of MMR vaccine or rubella immunization with 1 dose of MMR vaccine.  Pneumococcal 13-valent conjugate (PCV13) vaccine. When indicated, a person who is uncertain of her immunization history and has no record of immunization should receive the PCV13 vaccine. An adult aged 42 years or older who has certain medical conditions and has not been previously immunized should receive 1 dose of PCV13 vaccine. This PCV13 should be followed with a dose of pneumococcal polysaccharide (PPSV23) vaccine. The PPSV23 vaccine dose should be obtained at least 1 or more year(s) after the dose of PCV13 vaccine. An adult aged 46 years or older who has certain medical conditions and previously received 1 or more doses of PPSV23 vaccine should receive 1 dose of PCV13. The PCV13 vaccine dose should be obtained 1 or more years after the last PPSV23 vaccine dose.    Pneumococcal polysaccharide (PPSV23) vaccine. When PCV13 is also indicated, PCV13 should be obtained first. All adults aged 58 years and older should be immunized. An adult younger than age 54 years who has certain medical conditions should be immunized. Any person who resides in a nursing home or long-term care facility should be immunized. An adult smoker should be immunized. People with an immunocompromised condition and certain other conditions should receive both PCV13 and PPSV23 vaccines. People with human immunodeficiency virus (HIV) infection should be immunized as soon as possible after diagnosis. Immunization during chemotherapy or radiation therapy should be avoided. Routine use of PPSV23 vaccine is not recommended for American Indians, [place] Natives, or people younger than 65 years unless there are medical conditions that require PPSV23 vaccine. When  indicated, people who have unknown immunization and have no record of immunization should receive PPSV23 vaccine. One-time revaccination 5 years after the first dose of PPSV23 is recommended for people aged 19-64 years who have chronic kidney failure, nephrotic syndrome, asplenia, or immunocompromised conditions. People who received 1-2 doses of PPSV23 before age 97 years should receive another dose of PPSV23 vaccine at age 79 years or later if at least 5 years have passed since the previous dose. Doses of PPSV23 are not needed for people immunized with PPSV23 at or after age 39 years.  Preventive Services / Frequency   Ages 68 to 35 years  Blood pressure check.  Lipid and cholesterol check.  Lung cancer screening. / Every year if you are aged 63-80 years and have a 30-pack-year history of smoking and currently smoke or have quit within the past 15 years. Yearly screening is stopped once you have quit smoking for at least 15 years or develop a health problem that would prevent you from having lung cancer treatment.  Clinical breast exam.** / Every year after age 50 years.   BRCA-related cancer risk assessment.** / For women who have family members with a BRCA-related cancer (breast, ovarian, tubal, or peritoneal cancers).  Mammogram.** / Every year beginning at age 37 years and continuing for as long as you are in good health. Consult with your health care provider.  Pap test.** / Every 3 years starting at age 65 years through age 67 or 22 years with a history of 3 consecutive normal Pap tests.  HPV screening.** / Every 3 years from ages 62 years through ages 99 to 25 years with a history of 3 consecutive normal Pap tests.  Fecal occult blood test (FOBT) of stool. / Every year beginning at age 41 years and continuing until age 39 years. You may not need to do this test if you get a colonoscopy every 10 years.  Flexible sigmoidoscopy or colonoscopy.** / Every 5 years for a flexible  sigmoidoscopy or every 10 years for a colonoscopy beginning at age 38 years and continuing until age 68 years.  Hepatitis C blood test.** / For all people born from [birth year] through 1965 and any individual with known risks for hepatitis C.  Skin self-exam. / Monthly.  Influenza vaccine. / Every year.  Tetanus, diphtheria, and acellular pertussis (Tdap/Td) vaccine.** / Consult your health care provider. Pregnant women should receive 1 dose of Tdap vaccine during each pregnancy. 1 dose of Td every 10 years.  Varicella vaccine.** / Consult your health care provider. Pregnant females who do not have evidence of immunity should receive the first dose after pregnancy.  Zoster vaccine.** / 1 dose for adults aged 60 years or older.  Pneumococcal 13-valent conjugate (PCV13) vaccine.** / Consult your health care provider.  Pneumococcal polysaccharide (PPSV23) vaccine.** / 1 to 2 doses if you smoke cigarettes or if you have certain conditions.  Meningococcal vaccine.** / Consult your health care provider.  Hepatitis A vaccine.** / Consult your health care provider.  Hepatitis B vaccine.** / Consult your health care provider. Screening for abdominal aortic aneurysm (AAA)  by ultrasound is recommended for people over 50 who have history of high blood pressure or who are current or former smokers. ++++++++++++++++++ Recommend Adult Low Dose Aspirin or  coated  Aspirin 81 mg daily  To reduce risk of Colon Cancer 20 %,  Skin Cancer 26 % ,  Melanoma 46%  and  Pancreatic cancer 60% +++++++++++++++++++ Vitamin D goal  is between 70-100.  Please make sure that you are taking your Vitamin D as directed.  It is very important as a natural anti-inflammatory  helping hair, skin, and nails, as well as reducing stroke and heart attack risk.  It helps your bones and helps with mood. It also decreases numerous cancer risks so please take it as directed.  Low Vit D is associated with a 200-300% higher risk  for CANCER  and 200-300% higher risk for HEART   ATTACK  &  STROKE.   .....................................Marland Kitchen It is also associated with higher death rate at younger ages,  autoimmune diseases like Rheumatoid arthritis, Lupus, Multiple Sclerosis.  Also many other serious conditions, like depression, Alzheimer's Dementia, infertility, muscle aches, fatigue, fibromyalgia - just to name a few. ++++++++++++++++++ Recommend the book "The END of DIETING" by Dr Excell Seltzer  & the book "The END of DIABETES " by Dr Excell Seltzer At [email or web address] - get book & Audio CD's    Being diabetic has a  300% increased risk for heart attack, stroke, cancer, and alzheimer- type vascular dementia. It is very important that you work harder with diet by avoiding all foods that are white. Avoid white rice (brown & wild rice is OK), white potatoes (sweetpotatoes in moderation is OK), White bread or wheat bread or anything made out of white flour like bagels, donuts, rolls, buns, biscuits, cakes, pastries, cookies, pizza crust, and pasta (made from white flour & egg whites) - vegetarian pasta or spinach or wheat pasta is OK. Multigrain breads like Arnold's or Pepperidge [employer], or multigrain sandwich thins or flatbreads.  Diet, exercise and weight loss can reverse and cure diabetes in the early stages.  Diet, exercise and weight loss is very important in the control and prevention of complications of diabetes which affects every system in your body, ie. Brain - dementia/stroke, eyes - glaucoma/blindness, heart - heart attack/heart failure, kidneys - dialysis, stomach - gastric paralysis, intestines - malabsorption, nerves - severe painful neuritis, circulation - gangrene & loss of a leg(s), and finally cancer and Alzheimers.    I recommend avoid fried & greasy foods,  sweets/candy, white rice (brown or wild rice or Quinoa is OK), white potatoes (sweet potatoes are OK) - anything made from white flour - bagels, doughnuts, rolls, buns,  biscuits,white and wheat breads, pizza crust and traditional pasta made of white flour & egg white(vegetarian pasta or spinach or wheat pasta is OK).  Multi-grain bread is OK - like multi-grain flat bread or sandwich thins. Avoid alcohol in excess. Exercise is also important.    Eat all the vegetables you want - avoid meat, especially red meat and dairy - especially cheese.  Cheese is the most concentrated form of trans-fats which is the worst thing to clog up our arteries. Veggie cheese is OK which can be found in the fresh produce section at [employer] or [employer] or [employer]  ++++++++++++++++++++++ DASH Eating Plan  DASH stands for "Dietary Approaches to Stop Hypertension."   The DASH eating plan is a healthy eating plan that has been shown to reduce high blood pressure (hypertension). Additional health benefits may include reducing the risk of type 2 diabetes mellitus, heart disease, and stroke. The DASH eating plan may also help with weight loss. WHAT DO I NEED TO KNOW ABOUT THE DASH EATING PLAN? For the DASH eating plan, you will follow these general guidelines:  Choose foods with a percent daily value for sodium of less than 5% (as listed on the food label).  Use salt-free seasonings or herbs instead of table salt or sea salt.  Check with your health care provider or pharmacist before using salt substitutes.  Eat lower-sodium products, often labeled as "lower sodium" or "no salt added."  Eat fresh foods.  Eat more vegetables, fruits, and low-fat dairy products.  Choose whole grains. Look for the word "whole" as the first word in the ingredient list.  Choose fish   Limit sweets, desserts, sugars, and sugary drinks.  Choose heart-healthy fats.  Eat veggie cheese   Eat more home-cooked food and less restaurant, buffet, and fast food.  Limit fried foods.  Cook foods using  methods other than frying.  Limit canned vegetables. If you do use them, rinse them well to  decrease the sodium.  When eating at a restaurant, ask that your food be prepared with less salt, or no salt if possible.                      WHAT FOODS CAN I EAT? Read Dr Fara Olden Fuhrman's books on The End of Dieting & The End of Diabetes  Grains Whole grain or whole wheat bread. Brown rice. Whole grain or whole wheat pasta. Quinoa, bulgur, and whole grain cereals. Low-sodium cereals. Corn or whole wheat flour tortillas. Whole grain cornbread. Whole grain crackers. Low-sodium crackers.  Vegetables Fresh or frozen vegetables (raw, steamed, roasted, or grilled). Low-sodium or reduced-sodium tomato and vegetable juices. Low-sodium or reduced-sodium tomato sauce and paste. Low-sodium or reduced-sodium canned vegetables.   Fruits All fresh, canned (in natural juice), or frozen fruits.  Protein Products  All fish and seafood.  Dried beans, peas, or lentils. Unsalted nuts and seeds. Unsalted canned beans.  Dairy Low-fat dairy products, such as skim or 1% milk, 2% or reduced-fat cheeses, low-fat ricotta or cottage cheese, or plain low-fat yogurt. Low-sodium or reduced-sodium cheeses.  Fats and Oils Tub margarines without trans fats. Light or reduced-fat mayonnaise and salad dressings (reduced sodium). Avocado. Safflower, olive, or canola oils. Natural peanut or almond butter.  Other Unsalted popcorn and pretzels. The items listed above may not be a complete list of recommended foods or beverages. Contact your dietitian for more options.  ++++++++++++++++++  WHAT FOODS ARE NOT RECOMMENDED? Grains/ White flour or wheat flour White bread. White pasta. White rice. Refined cornbread. Bagels and croissants. Crackers that contain trans fat.  Vegetables  Creamed or fried vegetables. Vegetables in a . Regular canned vegetables. Regular canned tomato sauce and paste. Regular tomato and vegetable juices.  Fruits Dried fruits. Canned fruit in light or heavy syrup. Fruit juice.  Meat and Other  Protein Products Meat in general - RED meat & White meat.  Fatty cuts of meat. Ribs, chicken wings, all processed meats as bacon, sausage, bologna, salami, fatback, hot dogs, bratwurst and packaged luncheon meats.  Dairy Whole or 2% milk, cream, half-and-half, and cream cheese. Whole-fat or sweetened yogurt. Full-fat cheeses or blue cheese. Non-dairy creamers and whipped toppings. Processed cheese, cheese spreads, or cheese curds.  Condiments Onion and garlic salt, seasoned salt, table salt, and sea salt. Canned and packaged gravies. Worcestershire sauce. Tartar sauce. Barbecue sauce. Teriyaki sauce. Soy sauce, including reduced sodium. Steak sauce. Fish sauce. Oyster sauce. Cocktail sauce. Horseradish. Ketchup and mustard. Meat flavorings and tenderizers. Bouillon cubes. Hot sauce. Tabasco sauce. Marinades. Taco seasonings. Relishes.  Fats and Oils Butter, stick margarine, lard, shortening and bacon fat. Coconut, palm kernel, or palm oils. Regular salad dressings.  Pickles and olives. Salted popcorn and pretzels.  The items listed above may not be a complete list of foods and beverages to avoid.

## 2018-04-20 NOTE — Progress Notes (Addendum)
[facility] Unk Pinto, M.D.     Uvaldo Bristle. Silverio Lay, P.A.-C Liane Comber, [place] [address] [place], [place] [address] Telephone [phone] Telefax [phone] Annual Screening/Preventative Visit & Comprehensive Evaluation &  Examination     This very nice 62 y.o. DWF presents for a Screening /Preventative Visit & comprehensive evaluation and management of multiple medical co-morbidities.  Patient has been followed for HTN, HLD, Prediabetes  and Vitamin D Deficiency. She has Hyperuricemia controlled on Allopurinol.  Other problems include x/o IBS-C.       HTN predates circa 2000. Patient's BP has been controlled at home and patient denies any cardiac symptoms as chest pain, palpitations, shortness of breath, dizziness or ankle swelling. Today's BP is at goal -  124/78. Patient has hx/o GERD and IBS-C. She has hyperuricemia controlled on her meds.      Patient's hyperlipidemia is controlled with diet and medications. Patient denies myalgias or other medication SE's. Last lipids were at goal: Lab Results  Component Value Date   CHOL 199 04/21/2018   HDL 80 04/21/2018   LDLCALC 102 (H) 04/21/2018   TRIG 78 04/21/2018   CHOLHDL 2.5 04/21/2018      Patient has hx/o mild peripheral sensory and also elevated random glucose and has been followed expectantly for prediabetes / Diabetes.  Patient denies reactive hypoglycemic symptoms, visual blurring or  diabetic polys. Last A1c was Normal & at goal: Lab Results  Component Value Date   HGBA1C 5.2 04/21/2018      Finally, patient has history of Vitamin D Deficiency "(50" / 2008) and last Vitamin D was at goal: Lab Results  Component Value Date   VD25OH 89 04/21/2018   Current Outpatient Medications on File Prior to Visit  Medication Sig  . allopurinol (ZYLOPRIM) 300 MG tablet TAKE ONE-HALF TABLET BY  MOUTH DAILY  . aspirin 81 MG tablet Take 81 mg by  mouth every other day.   . bumetanide (BUMEX) 2 MG tablet TAKE 1 TABLET BY MOUTH TWO  TIMES DAILY  . Cholecalciferol (VITAMIN D3) 5000 units CAPS Takes 1 cap daily  . loratadine (CLARITIN) 10 MG tablet Take 1 tablet (10 mg total) by mouth daily as needed for allergies.  . Magnesium 250 MG TABS Take 2 tablets by mouth 2 (two) times daily.  . Multiple Vitamin (MULTIVITAMIN) tablet Take 1 tablet by mouth daily.  . potassium chloride SA (K-DUR,KLOR-CON) 20 MEQ tablet TAKE 1 TABLET BY MOUTH TWO  TIMES DAILY  . fluticasone (FLONASE) 50 MCG/ACT nasal spray Place 1 spray into both nostrils daily. (Patient taking differently: Place 1 spray into both nostrils as needed. )  . montelukast (SINGULAIR) 10 MG tablet Take 1 tablet daily for allergies   No current facility-administered medications on file prior to visit.    Allergies  Allergen Reactions  . Alprazolam Other (See Comments)     Unknown reaction per pt  . Prednisone     Only high doses   Past Medical History:  Diagnosis Date  . Abnormal glucose 02/05/2015  . Allergy   . Contact lens/glasses fitting    wears contacts or glasses  . GERD (gastroesophageal reflux disease)   . Hyperlipidemia   . Hypertension   . Hyperuricemia 08/13/2014  . Irritable bowel syndrome 10/18/2007  . Irritable bowel syndrome (IBS)   . Medication management 12/26/2013  . Peripheral neuropathy    in feet  . POLYP, COLON 10/18/2007  . PONV (postoperative nausea  and vomiting)    nausea, slow to wake up at times  . Vitamin D deficiency    Health Maintenance  Topic Date Due  . MAMMOGRAM  01/05/2019  . COLONOSCOPY  08/05/2019  . TETANUS/TDAP  11/30/2019  . PAP SMEAR  03/17/2021  . INFLUENZA VACCINE  Completed  . Hepatitis C Screening  Completed  . HIV Screening  Completed   Immunization History  Administered Date(s) Administered  . Influenza Inj Mdck Quad With Preservative 04/21/2018  . PPD Test 01/25/2014, 02/05/2015, 02/27/2016, 04/21/2018  . Pneumococcal  Polysaccharide-23 11/23/2008  . Tdap 11/29/2009   Last Colon - 08/04/2016 - Dr Hilarie Fredrickson - recc f/u 3 years - due Feb 2021  Last MGM - 01/04/2018  Past Surgical History:  Procedure Laterality Date  . carpel tunnel surgery Right   . CHOLECYSTECTOMY  04/14/2012   Procedure: LAPAROSCOPIC CHOLECYSTECTOMY;  Surgeon: Rolm Bookbinder, MD;  Location: [facility];  Service: General;  Laterality: N/A;  . COLONOSCOPY    . EYE SURGERY     at age 38yr  . FOOT SURGERY     both feet - little toes on each foot  . goiter removed fro thyroid    . OPEN REDUCTION INTERNAL FIXATION (ORIF) DISTAL RADIAL FRACTURE Right 07/04/2015   Procedure: OPEN REDUCTION INTERNAL FIXATION (ORIF) RIGHT DISTAL RADIUS FRACTURE;  Surgeon: Iran Planas, MD;  Location: [facility];  Service: Orthopedics;  Laterality: Right;  . WISDOM TOOTH EXTRACTION     Family History  Problem Relation Age of Onset  . Colon cancer Father 59  . Heart disease Mother   . Breast cancer Maternal Aunt   . Esophageal cancer Neg Hx   . Rectal cancer Neg Hx   . Stomach cancer Neg Hx    Social History   Tobacco Use  . Smoking status: Never Smoker  . Smokeless tobacco: Never Used  Substance Use Topics  . Alcohol use: No  . Drug use: No    ROS Constitutional: Denies fever, chills, weight loss/gain, headaches, insomnia,  night sweats, and change in appetite. Does c/o fatigue. Eyes: Denies redness, blurred vision, diplopia, discharge, itchy, watery eyes.  ENT: Denies discharge, congestion, post nasal drip, epistaxis, sore throat, earache, hearing loss, dental pain, Tinnitus, Vertigo, Sinus pain, snoring.  Cardio: Denies chest pain, palpitations, irregular heartbeat, syncope, dyspnea, diaphoresis, orthopnea, PND, claudication, edema Respiratory: denies cough, dyspnea, DOE, pleurisy, hoarseness, laryngitis, wheezing.  Gastrointestinal: Denies dysphagia, heartburn, reflux, water brash, pain, cramps, nausea, vomiting, bloating, diarrhea,  constipation, hematemesis, melena, hematochezia, jaundice, hemorrhoids Genitourinary: Denies dysuria, frequency, urgency, nocturia, hesitancy, discharge, hematuria, flank pain Breast: Breast lumps, nipple discharge, bleeding.  Musculoskeletal: Denies arthralgia, myalgia, stiffness, Jt. Swelling, pain, limp, and strain/sprain. Denies falls. Skin: Denies puritis, rash, hives, warts, acne, eczema, changing in skin lesion Neuro: No weakness, tremor, incoordination, spasms, paresthesia, pain Psychiatric: Denies confusion, memory loss, sensory loss. Denies Depression. Endocrine: Denies change in weight, skin, hair change, nocturia, and paresthesia, diabetic polys, visual blurring, hyper / hypo glycemic episodes.  Heme/Lymph: No excessive bleeding, bruising, enlarged lymph nodes.  Physical Exam  BP 124/78   Pulse (!) 52   Temp (!) 97.1 F (36.2 C)   Resp 16   Ht 5\' 7"  (1.702 m)   Wt 141 lb (64 kg)   BMI 22.08 kg/m   General Appearance: Well nourished, well groomed and in no apparent distress.  Eyes: PERRLA, EOMs, conjunctiva no swelling or erythema, normal fundi and vessels. Sinuses: No frontal/maxillary tenderness ENT/Mouth: EACs patent / TMs  nl. Nares clear without erythema, swelling, mucoid exudates. Oral hygiene is good. No erythema, swelling, or exudate. Tongue normal, non-obstructing. Tonsils not swollen or erythematous. Hearing normal.  Neck: Supple, thyroid not palpable. No bruits, nodes or JVD. Respiratory: Respiratory effort normal.  BS equal and clear bilateral without rales, rhonci, wheezing or stridor. Cardio: Heart sounds are normal with regular rate and rhythm and no murmurs, rubs or gallops. Peripheral pulses are normal and equal bilaterally without edema. No aortic or femoral bruits. Chest: symmetric with normal excursions and percussion. Breasts: Symmetric, without lumps, nipple discharge, retractions, or fibrocystic changes.  Abdomen: Flat, soft with bowel sounds active.  Nontender, no guarding, rebound, hernias, masses, or organomegaly.  Lymphatics: Non tender without lymphadenopathy.  Genitourinary:  Musculoskeletal: Full ROM all peripheral extremities, joint stability, 5/5 strength, and normal gait. Skin: Warm and dry without rashes, lesions, cyanosis, clubbing or  ecchymosis.  Neuro: Cranial nerves intact, reflexes equal bilaterally. Normal muscle tone, no cerebellar symptoms. Sensation intact.  Pysch: Alert and oriented X 3, normal affect, Insight and Judgment appropriate.   Assessment and Plan  1. Annual Preventative Screening Examination  2. Essential hypertension  - EKG 12-Lead - [place], RETROPERITNL ABD,  LTD - Urinalysis, Routine w reflex microscopic - Microalbumin / creatinine urine ratio - CBC with Differential/Platelet - COMPLETE METABOLIC PANEL WITH GFR - Magnesium - TSH  3. Hyperlipidemia, mixed  - EKG 12-Lead - [place], RETROPERITNL ABD,  LTD - Lipid panel - TSH  4. Abnormal glucose  - EKG 12-Lead - [place], RETROPERITNL ABD,  LTD - Hemoglobin A1c - Insulin, random  5. Vitamin D deficiency  - VITAMIN D 25 Hydroxy  6. Prediabetes  - EKG 12-Lead - [place], RETROPERITNL ABD,  LTD - Hemoglobin A1c - Insulin, random  7. Gastroesophageal reflux disease  - famotidine (PEPCID) 40 MG tablet; Take 1 tablet at bedtime for Acid Reflux  Dispense: 90 tablet; Refill: 3  8. Hyperuricemia  - Uric acid  9. Screening for colorectal cancer  - POC Hemoccult Bld/Stle  10. Screening for ischemic heart disease  - EKG 12-Lead  11. FHx: heart disease  - EKG 12-Lead - [place], RETROPERITNL ABD,  LTD  12. Screening for AAA (aortic abdominal aneurysm)  - [place], RETROPERITNL ABD,  LTD  13. Screening examination for pulmonary tuberculosis  - Iron,Total/Total Iron Binding Cap - Vitamin B12 - PPD  14. Need for prophylactic vaccination and inoculation against influenza  - FLU VACCINE MDCK QUAD W/Preservative  15. Fatigue, unspecified type  -  CBC with Differential/Platelet - TSH  16. Medication management  - Urinalysis, Routine w reflex microscopic - Microalbumin / creatinine urine ratio - CBC with Differential/Platelet - COMPLETE METABOLIC PANEL WITH GFR - Magnesium - Lipid panel - TSH - Hemoglobin A1c - Insulin, random - VITAMIN D 25 Hydroxy  - Uric acid     Patient was counseled in prudent diet to achieve/maintain BMI less than 25 for weight control, BP monitoring, regular exercise and medications. Discussed med's effects and SE's. Screening labs and tests as requested with regular follow-up as recommended. Over 40 minutes of exam, counseling, chart review and high complex critical decision making was performed.

## 2018-04-21 ENCOUNTER — Encounter: Payer: Self-pay | Admitting: Internal Medicine

## 2018-04-21 ENCOUNTER — Ambulatory Visit: Payer: 59 | Admitting: Internal Medicine

## 2018-04-21 VITALS — BP 124/78 | HR 52 | Temp 97.1°F | Resp 16 | Ht 67.0 in | Wt 141.0 lb

## 2018-04-21 DIAGNOSIS — R7303 Prediabetes: Secondary | ICD-10-CM | POA: Insufficient documentation

## 2018-04-21 DIAGNOSIS — Z23 Encounter for immunization: Secondary | ICD-10-CM | POA: Diagnosis not present

## 2018-04-21 DIAGNOSIS — Z0000 Encounter for general adult medical examination without abnormal findings: Secondary | ICD-10-CM | POA: Diagnosis not present

## 2018-04-21 DIAGNOSIS — R5383 Other fatigue: Secondary | ICD-10-CM

## 2018-04-21 DIAGNOSIS — Z136 Encounter for screening for cardiovascular disorders: Secondary | ICD-10-CM

## 2018-04-21 DIAGNOSIS — E559 Vitamin D deficiency, unspecified: Secondary | ICD-10-CM

## 2018-04-21 DIAGNOSIS — I10 Essential (primary) hypertension: Secondary | ICD-10-CM | POA: Diagnosis not present

## 2018-04-21 DIAGNOSIS — Z111 Encounter for screening for respiratory tuberculosis: Secondary | ICD-10-CM | POA: Diagnosis not present

## 2018-04-21 DIAGNOSIS — Z79899 Other long term (current) drug therapy: Secondary | ICD-10-CM

## 2018-04-21 DIAGNOSIS — R7309 Other abnormal glucose: Secondary | ICD-10-CM

## 2018-04-21 DIAGNOSIS — Z1211 Encounter for screening for malignant neoplasm of colon: Secondary | ICD-10-CM

## 2018-04-21 DIAGNOSIS — Z0001 Encounter for general adult medical examination with abnormal findings: Secondary | ICD-10-CM

## 2018-04-21 DIAGNOSIS — K219 Gastro-esophageal reflux disease without esophagitis: Secondary | ICD-10-CM

## 2018-04-21 DIAGNOSIS — Z8249 Family history of ischemic heart disease and other diseases of the circulatory system: Secondary | ICD-10-CM

## 2018-04-21 DIAGNOSIS — E782 Mixed hyperlipidemia: Secondary | ICD-10-CM

## 2018-04-21 DIAGNOSIS — Z1212 Encounter for screening for malignant neoplasm of rectum: Secondary | ICD-10-CM

## 2018-04-21 DIAGNOSIS — E790 Hyperuricemia without signs of inflammatory arthritis and tophaceous disease: Secondary | ICD-10-CM

## 2018-04-21 MED ORDER — FAMOTIDINE 40 MG PO TABS: ORAL_TABLET | ORAL | 3 refills | Status: DC

## 2018-04-22 LAB — CBC WITH DIFFERENTIAL/PLATELET
BASOS PCT: 1.2 %
Basophils Absolute: 103 cells/uL (ref 0–200)
EOS ABS: 77 {cells}/uL (ref 15–500)
Eosinophils Relative: 0.9 %
HCT: 43.1 % (ref 35.0–45.0)
Hemoglobin: 15 g/dL (ref 11.7–15.5)
Lymphs Abs: 3844 cells/uL (ref 850–3900)
MCH: 32.3 pg (ref 27.0–33.0)
MCHC: 34.8 g/dL (ref 32.0–36.0)
MCV: 92.7 fL (ref 80.0–100.0)
MPV: 11.9 fL (ref 7.5–12.5)
Monocytes Relative: 5.6 %
Neutro Abs: 4094 cells/uL (ref 1500–7800)
Neutrophils Relative %: 47.6 %
PLATELETS: 352 10*3/uL (ref 140–400)
RBC: 4.65 10*6/uL (ref 3.80–5.10)
RDW: 11.9 % (ref 11.0–15.0)
TOTAL LYMPHOCYTE: 44.7 %
WBC mixed population: 482 cells/uL (ref 200–950)
WBC: 8.6 10*3/uL (ref 3.8–10.8)

## 2018-04-22 LAB — COMPLETE METABOLIC PANEL WITH GFR
AG RATIO: 2 (calc) (ref 1.0–2.5)
ALBUMIN MSPROF: 5 g/dL (ref 3.6–5.1)
ALT: 18 U/L (ref 6–29)
AST: 30 U/L (ref 10–35)
Alkaline phosphatase (APISO): 81 U/L (ref 33–130)
BUN/Creatinine Ratio: 16 (calc) (ref 6–22)
BUN: 20 mg/dL (ref 7–25)
CALCIUM: 10.4 mg/dL (ref 8.6–10.4)
CO2: 32 mmol/L (ref 20–32)
CREATININE: 1.25 mg/dL — AB (ref 0.50–0.99)
Chloride: 100 mmol/L (ref 98–110)
GFR, EST AFRICAN AMERICAN: 53 mL/min/{1.73_m2} — AB (ref >=60)
GFR, EST NON AFRICAN AMERICAN: 46 mL/min/{1.73_m2} — AB (ref >=60)
GLOBULIN: 2.5 g/dL (ref 1.9–3.7)
GLUCOSE: 75 mg/dL (ref 65–99)
POTASSIUM: 4 mmol/L (ref 3.5–5.3)
SODIUM: 141 mmol/L (ref 135–146)
TOTAL PROTEIN: 7.5 g/dL (ref 6.1–8.1)
Total Bilirubin: 1 mg/dL (ref 0.2–1.2)

## 2018-04-22 LAB — URINALYSIS, ROUTINE W REFLEX MICROSCOPIC
BACTERIA UA: NONE SEEN /HPF
BILIRUBIN URINE: NEGATIVE
Glucose, UA: NEGATIVE
HGB URINE DIPSTICK: NEGATIVE
Hyaline Cast: NONE SEEN /LPF
KETONES UR: NEGATIVE
NITRITE: NEGATIVE
PH: 7.5 (ref 5.0–8.0)
Protein, ur: NEGATIVE
RBC / HPF: NONE SEEN /HPF (ref 0–2)
SQUAMOUS EPITHELIAL / LPF: NONE SEEN /HPF (ref <=5)
Specific Gravity, Urine: 1.016 (ref 1.001–1.03)

## 2018-04-22 LAB — HEMOGLOBIN A1C
EAG (MMOL/L): 5.7 (calc)
Hgb A1c MFr Bld: 5.2 % of total Hgb (ref <5.7)
MEAN PLASMA GLUCOSE: 103 (calc)

## 2018-04-22 LAB — IRON, TOTAL/TOTAL IRON BINDING CAP
%SAT: 25 % (ref 16–45)
Iron: 97 ug/dL (ref 45–160)
TIBC: 381 ug/dL (ref 250–450)

## 2018-04-22 LAB — LIPID PANEL
CHOL/HDL RATIO: 2.5 (calc) (ref <5.0)
CHOLESTEROL: 199 mg/dL (ref <200)
HDL: 80 mg/dL (ref >50)
LDL Cholesterol (Calc): 102 mg/dL (calc) — ABNORMAL HIGH
NON-HDL CHOLESTEROL (CALC): 119 mg/dL (ref <130)
Triglycerides: 78 mg/dL (ref <150)

## 2018-04-22 LAB — MICROALBUMIN / CREATININE URINE RATIO
Creatinine, Urine: 116 mg/dL (ref 20–275)
MICROALB UR: 1 mg/dL
Microalb Creat Ratio: 9 mcg/mg creat (ref <30)

## 2018-04-22 LAB — TSH: TSH: 2.53 mIU/L (ref 0.40–4.50)

## 2018-04-22 LAB — URIC ACID: URIC ACID, SERUM: 4.8 mg/dL (ref 2.5–7.0)

## 2018-04-22 LAB — VITAMIN B12: Vitamin B-12: 1049 pg/mL (ref 200–1100)

## 2018-04-22 LAB — MAGNESIUM: Magnesium: 2.5 mg/dL (ref 1.5–2.5)

## 2018-04-22 LAB — VITAMIN D 25 HYDROXY (VIT D DEFICIENCY, FRACTURES): Vit D, 25-Hydroxy: 89 ng/mL (ref 30–100)

## 2018-04-22 LAB — INSULIN, RANDOM: Insulin: 4 u[IU]/mL (ref 2.0–19.6)

## 2018-04-25 ENCOUNTER — Encounter: Payer: Self-pay | Admitting: Internal Medicine

## 2018-05-26 ENCOUNTER — Encounter: Payer: Self-pay | Admitting: Internal Medicine

## 2018-07-27 ENCOUNTER — Ambulatory Visit: Payer: Self-pay | Admitting: Adult Health

## 2018-07-27 ENCOUNTER — Ambulatory Visit: Payer: Self-pay | Admitting: Adult Health Nurse Practitioner

## 2018-08-03 ENCOUNTER — Other Ambulatory Visit: Payer: Self-pay

## 2018-08-03 MED ORDER — BUMETANIDE 2 MG PO TABS: 2.0000 mg | ORAL_TABLET | Freq: Two times a day (BID) | ORAL | 1 refills | Status: DC

## 2018-08-03 MED ORDER — POTASSIUM CHLORIDE CRYS ER 20 MEQ PO TBCR: 20.0000 meq | EXTENDED_RELEASE_TABLET | Freq: Two times a day (BID) | ORAL | 1 refills | Status: DC

## 2018-08-03 NOTE — Progress Notes (Signed)
Patient requesting Bumetanide and Potassium be sent to [facility] to use Good [facility]

## 2018-09-05 ENCOUNTER — Other Ambulatory Visit: Payer: Self-pay | Admitting: Adult Health

## 2018-10-11 ENCOUNTER — Other Ambulatory Visit: Payer: Self-pay | Admitting: Internal Medicine

## 2018-10-28 ENCOUNTER — Ambulatory Visit: Payer: Self-pay | Admitting: Internal Medicine

## 2018-11-02 ENCOUNTER — Ambulatory Visit: Payer: Self-pay | Admitting: Internal Medicine

## 2018-11-17 ENCOUNTER — Ambulatory Visit: Payer: Self-pay | Admitting: Internal Medicine

## 2018-12-14 ENCOUNTER — Other Ambulatory Visit: Payer: Self-pay | Admitting: Obstetrics and Gynecology

## 2018-12-14 DIAGNOSIS — Z1231 Encounter for screening mammogram for malignant neoplasm of breast: Secondary | ICD-10-CM

## 2018-12-19 ENCOUNTER — Encounter: Payer: Self-pay | Admitting: Internal Medicine

## 2018-12-19 NOTE — Progress Notes (Signed)
History of Present Illness:      This very nice 63 y.o. DWF presents for 6 month follow up with HTN, HLD, Pre-Diabetes and Vitamin D Deficiency. Patient has hx/o Gout controlled on Allopurinol.      Todayshe's also c/o sensation of pressure in her cheeks & forehead area with a sensation of Post nasal drainage (which is clear). Has tried Claritin , Cetirizine, Singulair & Flonase w/o benefit.       Patient is treated for HTN (2000) & BP has been controlled on diuretic therapy. Today's BP is at goal - 124/80. Patient has had no complaints of any cardiac type chest pain, palpitations, dyspnea / orthopnea / PND, dizziness, claudication, or dependent edema.      Hyperlipidemia is controlled with diet. Last Lipids were near goal: Lab Results  Component Value Date   CHOL 199 04/21/2018   HDL 80 04/21/2018   LDLCALC 102 (H) 04/21/2018   TRIG 78 04/21/2018   CHOLHDL 2.5 04/21/2018       Also, the patient has history of a mild peripheral sensory neuropathy and is monitored expectantly for PreDiabetes. She has had no symptoms of reactive hypoglycemia, diabetic polys or visual blurring, but does have hx/o paresthesias.  Last A1c was Normal & at goal: Lab Results  Component Value Date   HGBA1C 5.2 04/21/2018      Further, the patient also has history of Vitamin D Deficiency  ("26" / 2008)  and supplements vitamin D without any suspected side-effects. Last vitamin D was at goal: Lab Results  Component Value Date   VD25OH 89 04/21/2018   Current Outpatient Medications on File Prior to Visit  Medication Sig  . allopurinol (ZYLOPRIM) 300 MG tablet Take 1/2 to 1 tablet Daily to Prevent Gout  . aspirin 81 MG tablet Take 81 mg by mouth every other day.   . bumetanide (BUMEX) 2 MG tablet TAKE 1 TABLET BY MOUTH TWO  TIMES DAILY  . Cholecalciferol (VITAMIN D3) 5000 units CAPS Takes 1 cap daily  . famotidine (PEPCID) 40 MG tablet Take 1 tablet at bedtime for Acid Reflux  . Magnesium 250 MG TABS Take 2  tablets by mouth 2 (two) times daily.  . Multiple Vitamin (MULTIVITAMIN) tablet Take 1 tablet by mouth daily.  . potassium chloride SA (K-DUR,KLOR-CON) 20 MEQ tablet Take 1 tablet (20 mEq total) by mouth 2 (two) times daily.  . fluticasone (FLONASE) 50 MCG/ACT nasal spray Place 1 spray into both nostrils daily. (Patient taking differently: Place 1 spray into both nostrils as needed. )  . montelukast (SINGULAIR) 10 MG tablet Take 1 tablet daily for allergies   No current facility-administered medications on file prior to visit.    Allergies  Allergen Reactions  . Alprazolam Other (See Comments)     Unknown reaction per pt  . Prednisone     Only high doses   PMHx:   Past Medical History:  Diagnosis Date  . Abnormal glucose 02/05/2015  . Allergy   . Contact lens/glasses fitting    wears contacts or glasses  . GERD (gastroesophageal reflux disease)   . Hyperlipidemia   . Hypertension   . Hyperuricemia 08/13/2014  . Irritable bowel syndrome 10/18/2007  . Irritable bowel syndrome (IBS)   . Medication management 12/26/2013  . Peripheral neuropathy    in feet  . POLYP, COLON 10/18/2007  . PONV (postoperative nausea and vomiting)    nausea, slow to wake up at times  . Vitamin D deficiency  Immunization History  Administered Date(s) Administered  . Influenza Inj Mdck Quad With Preservative 04/21/2018  . PPD Test 01/25/2014, 02/05/2015, 02/27/2016, 04/21/2018  . Pneumococcal Polysaccharide-23 11/23/2008  . Tdap 11/29/2009   Past Surgical History:  Procedure Laterality Date  . carpel tunnel surgery Right   . CHOLECYSTECTOMY  04/14/2012   Procedure: LAPAROSCOPIC CHOLECYSTECTOMY;  Surgeon: Rolm Bookbinder, MD;  Location: [facility];  Service: General;  Laterality: N/A;  . COLONOSCOPY    . EYE SURGERY     at age 22yr  . FOOT SURGERY     both feet - little toes on each foot  . goiter removed fro thyroid    . OPEN REDUCTION INTERNAL FIXATION (ORIF) DISTAL RADIAL  FRACTURE Right 07/04/2015   Procedure: OPEN REDUCTION INTERNAL FIXATION (ORIF) RIGHT DISTAL RADIUS FRACTURE;  Surgeon: Iran Planas, MD;  Location: [facility];  Service: Orthopedics;  Laterality: Right;  . WISDOM TOOTH EXTRACTION     FHx:    Reviewed / unchanged  SHx:    Reviewed / unchanged   Systems Review:  Constitutional: Denies fever, chills, wt changes, headaches, insomnia, fatigue, night sweats, change in appetite. Eyes: Denies redness, blurred vision, diplopia, discharge, itchy, watery eyes.  ENT: Denies discharge, congestion, post nasal drip, epistaxis, sore throat, earache, hearing loss, dental pain, tinnitus, vertigo, sinus pain, snoring.  CV: Denies chest pain, palpitations, irregular heartbeat, syncope, dyspnea, diaphoresis, orthopnea, PND, claudication or edema. Respiratory: denies cough, dyspnea, DOE, pleurisy, hoarseness, laryngitis, wheezing.  Gastrointestinal: Denies dysphagia, odynophagia, heartburn, reflux, water brash, abdominal pain or cramps, nausea, vomiting, bloating, diarrhea, constipation, hematemesis, melena, hematochezia  or hemorrhoids. Genitourinary: Denies dysuria, frequency, urgency, nocturia, hesitancy, discharge, hematuria or flank pain. Musculoskeletal: Denies arthralgias, myalgias, stiffness, jt. swelling, pain, limping or strain/sprain.  Skin: Denies pruritus, rash, hives, warts, acne, eczema or change in skin lesion(s). Neuro: No weakness, tremor, incoordination, spasms, paresthesia or pain. Psychiatric: Denies confusion, memory loss or sensory loss. Endo: Denies change in weight, skin or hair change.  Heme/Lymph: No excessive bleeding, bruising or enlarged lymph nodes.  Physical Exam  BP 124/80   Pulse (!) 52   Temp (!) 97 F (36.1 C)   Resp 16   Ht 5\' 7"  (1.702 m)   Wt 144 lb (65.3 kg)   BMI 22.55 kg/m   Appears  well nourished, well groomed  and in no distress.  Eyes: PERRLA, EOMs, conjunctiva no swelling or erythema. Sinuses: No  frontal/maxillary tenderness ENT/Mouth: EAC's clear, TM's nl w/o erythema, bulging. Nares clear w/o erythema, swelling, exudates. Oropharynx clear without erythema or exudates. Oral hygiene is good. Tongue normal, non obstructing. Hearing intact.  Neck: Supple. Thyroid not palpable. Car 2+/2+ without bruits, nodes or JVD. Chest: Respirations nl with BS clear & equal w/o rales, rhonchi, wheezing or stridor.  Cor: Heart sounds normal w/ regular rate and rhythm without sig. murmurs, gallops, clicks or rubs. Peripheral pulses normal and equal  without edema.  Abdomen: Soft & bowel sounds normal. Non-tender w/o guarding, rebound, hernias, masses or organomegaly.  Lymphatics: Unremarkable.  Musculoskeletal: Full ROM all peripheral extremities, joint stability, 5/5 strength and normal gait.  Skin: Warm, dry without exposed rashes, lesions or ecchymosis apparent.  Neuro: Cranial nerves intact, reflexes equal bilaterally. Sensory-motor testing grossly intact. Tendon reflexes grossly intact.  Pysch: Alert & oriented x 3.  Insight and judgement nl & appropriate. No ideations.  Assessment and Plan:  1. Essential hypertension  - Continue medication, monitor blood pressure at home.  - Continue DASH diet.  Reminder to go to the ER if any CP,  SOB, nausea, dizziness, severe HA, changes vision/speech.  - CBC with Differential/Platelet - COMPLETE METABOLIC PANEL WITH GFR - Magnesium - TSH  2. Hyperlipidemia, mixed  - Continue diet/meds, exercise,& lifestyle modifications.  - Continue monitor periodic cholesterol/liver & renal functions   - Lipid panel - TSH  3. Abnormal glucose  - Insulin, random - Fructosamine  4. Vitamin D deficiency  - VITAMIN D 25 Hydroxyl  5. Hyperuricemia  - CBC with Differential/Platelet - COMPLETE METABOLIC PANEL WITH GFR - Magnesium - Lipid panel - TSH - Insulin, random - VITAMIN D 25 Hydroxyl - Fructosamine - Continue diet, exercise  - Lifestyle  modifications.  - Monitor appropriate labs. - Continue supplementation.       Discussed  regular exercise, BP monitoring, weight control to achieve/maintain BMI less than 25 and discussed med and SE's. Recommended labs to assess and monitor clinical status with further disposition pending results of labs.  I discussed the assessment and treatment plan with the patient. The patient was provided an opportunity to ask questions and all were answered. The patient agreed with the plan and demonstrated an understanding of the instructions.  I provided over 30 minutes of exam, counseling, chart review and  complex critical decision making.         The patient was advised to call back or seek an in-person evaluation if the symptoms worsen or if the condition fails to improve as anticipated.   Kirtland Bouchard, MD

## 2018-12-19 NOTE — Patient Instructions (Signed)

## 2018-12-20 ENCOUNTER — Ambulatory Visit: Payer: No Typology Code available for payment source | Admitting: Internal Medicine

## 2018-12-20 ENCOUNTER — Encounter: Payer: Self-pay | Admitting: Internal Medicine

## 2018-12-20 ENCOUNTER — Other Ambulatory Visit: Payer: Self-pay

## 2018-12-20 VITALS — BP 124/80 | HR 52 | Temp 97.0°F | Resp 16 | Ht 67.0 in | Wt 144.0 lb

## 2018-12-20 DIAGNOSIS — E782 Mixed hyperlipidemia: Secondary | ICD-10-CM

## 2018-12-20 DIAGNOSIS — I10 Essential (primary) hypertension: Secondary | ICD-10-CM | POA: Diagnosis not present

## 2018-12-20 DIAGNOSIS — R7309 Other abnormal glucose: Secondary | ICD-10-CM

## 2018-12-20 DIAGNOSIS — E790 Hyperuricemia without signs of inflammatory arthritis and tophaceous disease: Secondary | ICD-10-CM

## 2018-12-20 DIAGNOSIS — E559 Vitamin D deficiency, unspecified: Secondary | ICD-10-CM | POA: Diagnosis not present

## 2018-12-20 MED ORDER — DEXAMETHASONE 0.5 MG PO TABS
ORAL_TABLET | ORAL | 0 refills | Status: DC
Start: 2018-12-20 — End: 2019-04-27

## 2018-12-21 ENCOUNTER — Other Ambulatory Visit: Payer: Self-pay | Admitting: Internal Medicine

## 2018-12-21 MED ORDER — ROSUVASTATIN CALCIUM 20 MG PO TABS: ORAL_TABLET | ORAL | 3 refills | Status: DC

## 2018-12-24 LAB — CBC WITH DIFFERENTIAL/PLATELET
Absolute Monocytes: 466 cells/uL (ref 200–950)
Basophils Absolute: 97 cells/uL (ref 0–200)
Basophils Relative: 1.1 %
Eosinophils Absolute: 282 cells/uL (ref 15–500)
Eosinophils Relative: 3.2 %
HCT: 41.2 % (ref 35.0–45.0)
Hemoglobin: 14.3 g/dL (ref 11.7–15.5)
Lymphs Abs: 3793 cells/uL (ref 850–3900)
MCH: 32.7 pg (ref 27.0–33.0)
MCHC: 34.7 g/dL (ref 32.0–36.0)
MCV: 94.3 fL (ref 80.0–100.0)
MPV: 12.4 fL (ref 7.5–12.5)
Monocytes Relative: 5.3 %
Neutro Abs: 4162 cells/uL (ref 1500–7800)
Neutrophils Relative %: 47.3 %
Platelets: 339 10*3/uL (ref 140–400)
RBC: 4.37 10*6/uL (ref 3.80–5.10)
RDW: 12.4 % (ref 11.0–15.0)
Total Lymphocyte: 43.1 %
WBC: 8.8 10*3/uL (ref 3.8–10.8)

## 2018-12-24 LAB — TSH: TSH: 1.75 mIU/L (ref 0.40–4.50)

## 2018-12-24 LAB — COMPLETE METABOLIC PANEL WITH GFR
AG Ratio: 1.9 (calc) (ref 1.0–2.5)
ALT: 22 U/L (ref 6–29)
AST: 31 U/L (ref 10–35)
Albumin: 4.7 g/dL (ref 3.6–5.1)
Alkaline phosphatase (APISO): 87 U/L (ref 37–153)
BUN: 23 mg/dL (ref 7–25)
CO2: 32 mmol/L (ref 20–32)
Calcium: 10.2 mg/dL (ref 8.6–10.4)
Chloride: 103 mmol/L (ref 98–110)
Creat: 0.87 mg/dL (ref 0.50–0.99)
GFR, Est African American: 82 mL/min/{1.73_m2} (ref >=60)
GFR, Est Non African American: 70 mL/min/{1.73_m2} (ref >=60)
Globulin: 2.5 g/dL (calc) (ref 1.9–3.7)
Glucose, Bld: 73 mg/dL (ref 65–99)
Potassium: 3.7 mmol/L (ref 3.5–5.3)
Sodium: 138 mmol/L (ref 135–146)
Total Bilirubin: 0.7 mg/dL (ref 0.2–1.2)
Total Protein: 7.2 g/dL (ref 6.1–8.1)

## 2018-12-24 LAB — VITAMIN D 25 HYDROXY (VIT D DEFICIENCY, FRACTURES): Vit D, 25-Hydroxy: 91 ng/mL (ref 30–100)

## 2018-12-24 LAB — LIPID PANEL
Cholesterol: 200 mg/dL — ABNORMAL HIGH (ref <200)
HDL: 65 mg/dL (ref >=50)
LDL Cholesterol (Calc): 110 mg/dL (calc) — ABNORMAL HIGH
Non-HDL Cholesterol (Calc): 135 mg/dL (calc) — ABNORMAL HIGH (ref <130)
Total CHOL/HDL Ratio: 3.1 (calc) (ref <5.0)
Triglycerides: 142 mg/dL (ref <150)

## 2018-12-24 LAB — MAGNESIUM: Magnesium: 2.2 mg/dL (ref 1.5–2.5)

## 2018-12-24 LAB — INSULIN, RANDOM: Insulin: 3.7 u[IU]/mL

## 2018-12-24 LAB — FRUCTOSAMINE: Fructosamine: 273 umol/L (ref 205–285)

## 2018-12-28 ENCOUNTER — Telehealth: Payer: Self-pay | Admitting: *Deleted

## 2018-12-28 NOTE — Telephone Encounter (Signed)
Patient called and asked about taking vitamins to build her immunity.  Per Dr Melford Aase, she should take Vitamin C, Vitamin D and Zinc.

## 2019-01-26 ENCOUNTER — Ambulatory Visit: Payer: 59

## 2019-03-04 ENCOUNTER — Other Ambulatory Visit: Payer: Self-pay

## 2019-03-04 ENCOUNTER — Ambulatory Visit
Admission: RE | Admit: 2019-03-04 | Discharge: 2019-03-04 | Disposition: A | Discharge status: Home / Self Care | Payer: No Typology Code available for payment source | Source: Ambulatory Visit | Attending: Obstetrics and Gynecology | Admitting: Obstetrics and Gynecology

## 2019-03-04 DIAGNOSIS — Z1231 Encounter for screening mammogram for malignant neoplasm of breast: Secondary | ICD-10-CM

## 2019-04-11 ENCOUNTER — Other Ambulatory Visit: Payer: Self-pay | Admitting: Internal Medicine

## 2019-04-11 MED ORDER — ROSUVASTATIN CALCIUM 20 MG PO TABS: ORAL_TABLET | ORAL | 3 refills | Status: DC

## 2019-04-13 ENCOUNTER — Other Ambulatory Visit: Payer: Self-pay | Admitting: Internal Medicine

## 2019-04-24 NOTE — Progress Notes (Signed)
Subjective:    Patient ID: Katie Cox, female    DOB: Nov 30, 1955, 63 y.o.   MRN: [id number]  HPI   Patient is a very nice 63 yo DWF who presents with c/o a painful abscess of her middle mid to upper back.   Medication Sig  . allopurinol (ZYLOPRIM) 300 MG tablet Take 1/2 to 1 tablet Daily to Prevent Gout  . aspirin 81 MG tablet Take 81 mg by mouth every other day.   . bumetanide (BUMEX) 2 MG tablet TAKE 1 TABLET BY MOUTH TWO  TIMES DAILY  . Cholecalciferol (VITAMIN D3) 5000 units CAPS Takes 1 cap daily  . famotidine (PEPCID) 40 MG tablet Take 1 tablet at bedtime for Acid Reflux  . fluticasone (FLONASE) 50 MCG/ACT nasal spray Place 1 spray into both nostrils daily.  . Magnesium 250 MG TABS Take 2 tablets by mouth 2 (two) times daily.  . Multiple Vitamin (MULTIVITAMIN) tablet Take 1 tablet by mouth daily.  . potassium chloride SA (KLOR-CON) 20 MEQ tablet Take 1 tablet 2 x /day for Potassium  . rosuvastatin (CRESTOR) 20 MG tablet Take 1 tablet daily for Cholesterol  . dexamethasone (DECADRON) 0.5 MG tablet Take 1 tab 3 x day - 3 days, then 2 x day - 3 days, then 1 tab daily  . montelukast (SINGULAIR) 10 MG tablet Take 1 tablet daily for allergies   No facility-administered medications prior to visit.    Allergies  Allergen Reactions  . Alprazolam Other (See Comments)     Unknown reaction per pt  . Prednisone     Only high doses   Past Medical History:  Diagnosis Date  . Abnormal glucose 02/05/2015  . Allergy   . Contact lens/glasses fitting    wears contacts or glasses  . GERD (gastroesophageal reflux disease)   . Hyperlipidemia   . Hypertension   . Hyperuricemia 08/13/2014  . Irritable bowel syndrome 10/18/2007  . Irritable bowel syndrome (IBS)   . Medication management 12/26/2013  . Peripheral neuropathy    in feet  . POLYP, COLON 10/18/2007  . PONV (postoperative nausea and vomiting)    nausea, slow to wake up at times  . Vitamin D deficiency    Review of Systems   10  point systems review negative except as above.    Objective:   Physical Exam  BP 110/70   Pulse (!) 53   Temp (!) 96.8 F (36 C)   Ht 5\' 7"  (1.702 m)   Wt 146 lb 9.6 oz (66.5 kg)   SpO2 98%   BMI 22.96 kg/m   HEENT - WNL. Neck - supple.  Chest - Clear equal BS. Cor - Nl HS. RRR w/o sig MGR. MS- FROM w/o deformities.  Gait Nl. Neuro -  Nl w/o focal abnormalities. Skin - There is a fluctuant  multi-loculated draining abscess of the upper middle back - mid scapular level which measures 5 cm cross diameter.  Procedure ( CPT - [id number] )      After informed consent and aseptic prep with alcohol, the areas was anesthetized with 2 cc Marcaine 0.5%,  sub-cut & intradermal. Then wit a #10 scalpel a 3 cm transverse incision was made down to the level of the firm loculated tissue which was sharply excised and explanted along with a moderate amount of necrotic putrid cyst material and purulent detritus.  Then the wound cavity was irrigated x 3 with hydrogen peroxide and then x 1 with Betadine solution. Next the  cavity was tightly packed with iodoform gauze and edges approximated with steri-strips and covered with a 4" gauze and then with a 4" x 6 " Tegaderm.   Patient was instructed in post-op wound care.    Assessment & Plan:    1. Abscess of upper back excluding scapular region  - cephALEXin500 MG caps; Take 1 capsule 4 x  /day   Disp: 60 caps  - traMADol  50 MG tab; Take 1 tablet every 4 hours as needed for Pain  Disp: 30 tablet  Recommend ROV 2 days to advance packing.

## 2019-04-25 ENCOUNTER — Other Ambulatory Visit: Payer: Self-pay

## 2019-04-25 ENCOUNTER — Ambulatory Visit: Payer: No Typology Code available for payment source | Admitting: Internal Medicine

## 2019-04-25 ENCOUNTER — Encounter: Payer: Self-pay | Admitting: Internal Medicine

## 2019-04-25 VITALS — BP 110/70 | HR 53 | Temp 96.8°F | Ht 67.0 in | Wt 146.6 lb

## 2019-04-25 DIAGNOSIS — L02212 Cutaneous abscess of back [any part, except buttock]: Secondary | ICD-10-CM

## 2019-04-25 MED ORDER — TRAMADOL HCL 50 MG PO TABS: ORAL_TABLET | ORAL | 0 refills | Status: DC

## 2019-04-25 MED ORDER — CEPHALEXIN 500 MG PO CAPS: ORAL_CAPSULE | ORAL | 0 refills | Status: DC

## 2019-04-26 NOTE — Progress Notes (Signed)
   Subjective:    Patient ID: Katie Cox, female    DOB: November 25, 1955, 63 y.o.   MRN: [id number]  HPI   Patient is a very nice 63 yo DWF who had large multi-loculated infected sebaceous cyst I&D'd of her upper mid back 2 days ago. Returns for recheck / Dressing change.  Review of Systems   10 point systems review negative except as above.    Objective:   Physical Exam  BP 106/66   Pulse 60   Temp (!) 97.1 F (36.2 C)   Resp 16   Ht 5\' 7"  (1.702 m)   Wt 145 lb (65.8 kg)   BMI 22.71 kg/m   Skin - Wound site less swollen. 20" of iodoform packing removed . Wound cavity rinsed with Hydrogen Peroxide. Then wound edges anesthetized with 2 ml of Marcaine 0.5% sq & intradermal. . Then # 2 vertical mattress sutures were applied to loosely approximate the wound edges to allow healing by 2sd intention & sterile guaze & 4" x 6" Tegaderm applied    Assessment & Plan:   1. Abscess of upper back excluding scapular region  - Advised wound care & continue Abx and return in 5 days to recheck

## 2019-04-27 ENCOUNTER — Other Ambulatory Visit: Payer: Self-pay

## 2019-04-27 ENCOUNTER — Ambulatory Visit: Payer: No Typology Code available for payment source | Admitting: Internal Medicine

## 2019-04-27 VITALS — BP 106/66 | HR 60 | Temp 97.1°F | Resp 16 | Ht 67.0 in | Wt 145.0 lb

## 2019-04-27 DIAGNOSIS — L02212 Cutaneous abscess of back [any part, except buttock]: Secondary | ICD-10-CM

## 2019-05-02 ENCOUNTER — Encounter: Payer: Self-pay | Admitting: Internal Medicine

## 2019-05-02 ENCOUNTER — Other Ambulatory Visit: Payer: Self-pay

## 2019-05-02 ENCOUNTER — Ambulatory Visit: Payer: No Typology Code available for payment source | Admitting: Internal Medicine

## 2019-05-02 VITALS — BP 128/80 | HR 52 | Temp 97.3°F | Resp 16 | Ht 67.0 in | Wt 145.0 lb

## 2019-05-02 DIAGNOSIS — L02212 Cutaneous abscess of back [any part, except buttock]: Secondary | ICD-10-CM

## 2019-05-02 NOTE — Progress Notes (Signed)
   Subjective:    Patient ID: Katie Cox, female    DOB: 1956/02/17, 63 y.o.   MRN: [id number]  HPI    Patient is a very nice 63 yo DWF who had large multi-loculated infected sebaceous cyst I&D'd of her upper mid back 2 days ago. Returns for recheck / Dressing change.  Review of Systems  10 point systems review negative except as above.      Objective:   Physical Exam  BP 128/80   Pulse (!) 52   Temp (!) 97.3 F (36.3 C)   Resp 16   Ht 5\' 7"  (1.702 m)   Wt 145 lb (65.8 kg)   BMI 22.71 kg/m   Skin - Wound site not swollen.   A small amount of detritis  / old capsule & old blood clot expressed.   Wound cavity rinsed with Hydrogen Peroxide x 2 and then Betadine. Wound redressed with gauze & 4" x 6 " Tegaderm    Assessment & Plan:   1. Abscess of upper back  - Advised wound care & continue Abx and return in 5 days to recheck  - ROM 1 week to recheck

## 2019-05-09 ENCOUNTER — Ambulatory Visit: Payer: No Typology Code available for payment source | Admitting: Internal Medicine

## 2019-05-11 ENCOUNTER — Ambulatory Visit: Payer: No Typology Code available for payment source | Admitting: Internal Medicine

## 2019-05-11 ENCOUNTER — Encounter: Payer: Self-pay | Admitting: Internal Medicine

## 2019-05-11 ENCOUNTER — Other Ambulatory Visit: Payer: Self-pay

## 2019-05-11 VITALS — BP 118/84 | HR 60 | Temp 97.8°F | Resp 16 | Ht 67.0 in | Wt 147.4 lb

## 2019-05-11 DIAGNOSIS — L02212 Cutaneous abscess of back [any part, except buttock]: Secondary | ICD-10-CM

## 2019-05-11 NOTE — Progress Notes (Signed)
   Patient returns for recheck of wound - post excision and   I & D loculated infected sebaceous cyst of the upper back  Wound appears clean . #2 sutures intact.  - small amount of detritus expressed.   - wound irrigated with 2 ml of H2O2 and then with 1 ml of Betadine soln.  - Antibiotic Ung applied , covered with 2" gauze and 4" x 6 " guaze  -  Patient due back in 1 week for annual CPE and will recheck then.

## 2019-05-17 NOTE — Patient Instructions (Signed)

## 2019-05-17 NOTE — Progress Notes (Signed)
Annual Screening/Preventative Visit & Comprehensive Evaluation &  Examination     This very nice 63 y.o. DWF presents for a Screening /Preventative Visit & comprehensive evaluation and management of multiple medical co-morbidities.  Patient has been followed for HTN, HLD, T2_NIDDM  Prediabetes  and Vitamin D Deficiency. Patient has GERD & IBS-C controlled on her meds. Also she has Gout controlled on her Allopurinol.       HTN predates since 2000. Patient's BP has been controlled at home and patient denies any cardiac symptoms as chest pain, palpitations, shortness of breath, dizziness or ankle swelling. Today's BP: 116/72      Patient's hyperlipidemia is controlled with diet and medications. Patient denies myalgias or other medication SE's. Last lipids were not at goal:  Lab Results  Component Value Date   CHOL 200 (H) 12/20/2018   HDL 65 12/20/2018   LDLCALC 110 (H) 12/20/2018   TRIG 142 12/20/2018   CHOLHDL 3.1 12/20/2018      Patient has hx/o mild peripheral sensory and is monitored expectantly for prediabetes/ T2_DM and patient denies reactive hypoglycemic symptoms, visual blurring, diabetic polys or paresthesias. Last A1c was Normal 7 at goal:  Lab Results  Component Value Date   HGBA1C 5.2 04/21/2018      Finally, patient has history of Vitamin D Deficiency ("26" / 2008)  and last Vitamin D was at goal:  Lab Results  Component Value Date   VD25OH 91 12/20/2018   Current Outpatient Medications on File Prior to Visit  Medication Sig  . allopurinol (ZYLOPRIM) 300 MG tablet Take 1/2 to 1 tablet Daily to Prevent Gout  . aspirin 81 MG tablet Take 81 mg by mouth every other day.   . bumetanide (BUMEX) 2 MG tablet TAKE 1 TABLET BY MOUTH TWO  TIMES DAILY  . Cholecalciferol (VITAMIN D3) 5000 units CAPS Takes 1 cap daily  . famotidine (PEPCID) 40 MG tablet Take 1 tablet at bedtime for Acid Reflux  . Magnesium 250 MG TABS Take 2 tablets by mouth 2 (two) times daily.  . Multiple  Vitamin (MULTIVITAMIN) tablet Take 1 tablet by mouth daily.  Marland Kitchen OVER THE COUNTER MEDICATION Takes OTC Allegra for allergies.  . potassium chloride SA (KLOR-CON) 20 MEQ tablet Take 1 tablet 2 x /day for Potassium  . rosuvastatin (CRESTOR) 20 MG tablet Take 1 tablet daily for Cholesterol  . traMADol (ULTRAM) 50 MG tablet Take 1 tablet every 4 hours as needed for Pain  . fluticasone (FLONASE) 50 MCG/ACT nasal spray Place 1 spray into both nostrils daily.   No current facility-administered medications on file prior to visit.    Allergies  Allergen Reactions  . Alprazolam Other (See Comments)     Unknown reaction per pt  . Prednisone     Only high doses   Past Medical History:  Diagnosis Date  . Abnormal glucose 02/05/2015  . Allergy   . Contact lens/glasses fitting    wears contacts or glasses  . GERD (gastroesophageal reflux disease)   . Hyperlipidemia   . Hypertension   . Hyperuricemia 08/13/2014  . Irritable bowel syndrome 10/18/2007  . Irritable bowel syndrome (IBS)   . Medication management 12/26/2013  . Peripheral neuropathy    in feet  . POLYP, COLON 10/18/2007  . PONV (postoperative nausea and vomiting)    nausea, slow to wake up at times  . Vitamin D deficiency    Health Maintenance  Topic Date Due  . INFLUENZA VACCINE  01/22/2019  . COLONOSCOPY  08/05/2019  . TETANUS/TDAP  11/30/2019  . MAMMOGRAM  03/03/2020  . PAP SMEAR-Modifier  03/18/2021  . Hepatitis C Screening  Completed  . HIV Screening  Completed   Immunization History  Administered Date(s) Administered  . Influenza Inj Mdck Quad With Preservative 04/21/2018  . PPD Test 01/25/2014, 02/05/2015, 02/27/2016, 04/21/2018, 05/18/2019  . Pneumococcal Polysaccharide-23 11/23/2008  . Tdap 11/29/2009   Last Colon - 08/04/2016 - Dr Hilarie Fredrickson - recc f/u 3 years - due Feb 2021  Last MGM - 03/04/2019  Past Surgical History:  Procedure Laterality Date  . carpel tunnel surgery Right   . CHOLECYSTECTOMY  04/14/2012    Procedure: LAPAROSCOPIC CHOLECYSTECTOMY;  Surgeon: Rolm Bookbinder, MD;  Location: [facility];  Service: General;  Laterality: N/A;  . COLONOSCOPY    . EYE SURGERY     at age 80yr  . FOOT SURGERY     both feet - little toes on each foot  . goiter removed fro thyroid    . OPEN REDUCTION INTERNAL FIXATION (ORIF) DISTAL RADIAL FRACTURE Right 07/04/2015   Procedure: OPEN REDUCTION INTERNAL FIXATION (ORIF) RIGHT DISTAL RADIUS FRACTURE;  Surgeon: Iran Planas, MD;  Location: [facility];  Service: Orthopedics;  Laterality: Right;  . WISDOM TOOTH EXTRACTION     Family History  Problem Relation Age of Onset  . Colon cancer Father 1  . Heart disease Mother   . Breast cancer Maternal Aunt   . Esophageal cancer Neg Hx   . Rectal cancer Neg Hx   . Stomach cancer Neg Hx    Social History   Tobacco Use  . Smoking status: Never Smoker  . Smokeless tobacco: Never Used  Substance Use Topics  . Alcohol use: No  . Drug use: No    ROS Constitutional: Denies fever, chills, weight loss/gain, headaches, insomnia,  night sweats, and change in appetite. Does c/o fatigue. Eyes: Denies redness, blurred vision, diplopia, discharge, itchy, watery eyes.  ENT: Denies discharge, congestion, post nasal drip, epistaxis, sore throat, earache, hearing loss, dental pain, Tinnitus, Vertigo, Sinus pain, snoring.  Cardio: Denies chest pain, palpitations, irregular heartbeat, syncope, dyspnea, diaphoresis, orthopnea, PND, claudication, edema Respiratory: denies cough, dyspnea, DOE, pleurisy, hoarseness, laryngitis, wheezing.  Gastrointestinal: Denies dysphagia, heartburn, reflux, water brash, pain, cramps, nausea, vomiting, bloating, diarrhea, constipation, hematemesis, melena, hematochezia, jaundice, hemorrhoids Genitourinary: Denies dysuria, frequency, urgency, nocturia, hesitancy, discharge, hematuria, flank pain Breast: Breast lumps, nipple discharge, bleeding.  Musculoskeletal: Denies arthralgia,  myalgia, stiffness, Jt. Swelling, pain, limp, and strain/sprain. Denies falls. Skin: Denies puritis, rash, hives, warts, acne, eczema, changing in skin lesion Neuro: No weakness, tremor, incoordination, spasms, paresthesia, pain Psychiatric: Denies confusion, memory loss, sensory loss. Denies Depression. Endocrine: Denies change in weight, skin, hair change, nocturia, and paresthesia, diabetic polys, visual blurring, hyper / hypo glycemic episodes.  Heme/Lymph: No excessive bleeding, bruising, enlarged lymph nodes.  Physical Exam  BP 116/72   Pulse (!) 52   Temp (!) 97 F (36.1 C)   Resp 16   Ht 5\' 7"  (1.702 m)   Wt 145 lb 3.2 oz (65.9 kg)   BMI 22.74 kg/m   General Appearance: Well nourished, well groomed and in no apparent distress.  Eyes: PERRLA, EOMs, conjunctiva no swelling or erythema, normal fundi and vessels. Sinuses: No frontal/maxillary tenderness ENT/Mouth: EACs patent / TMs  nl. Nares clear without erythema, swelling, mucoid exudates. Oral hygiene is good. No erythema, swelling, or exudate. Tongue normal, non-obstructing. Tonsils not swollen or erythematous. Hearing normal.  Neck: Supple, thyroid not  palpable. No bruits, nodes or JVD. Respiratory: Respiratory effort normal.  BS equal and clear bilateral without rales, rhonci, wheezing or stridor. Cardio: Heart sounds are normal with regular rate and rhythm and no murmurs, rubs or gallops. Peripheral pulses are normal and equal bilaterally without edema. No aortic or femoral bruits. Chest: symmetric with normal excursions and percussion. Breasts: Symmetric, without lumps, nipple discharge, retractions, or fibrocystic changes.  Abdomen: Flat, soft with bowel sounds active. Nontender, no guarding, rebound, hernias, masses, or organomegaly.  Lymphatics: Non tender without lymphadenopathy.  Genitourinary:  Musculoskeletal: Full ROM all peripheral extremities, joint stability, 5/5 strength, and normal gait. Skin: Warm and dry  without rashes, lesions, cyanosis, clubbing or  ecchymosis.  Neuro: Cranial nerves intact, reflexes equal bilaterally. Normal muscle tone, no cerebellar symptoms. Sensation intact.  Pysch: Alert and oriented X 3, normal affect, Insight and Judgment appropriate.   Assessment and Plan  1. Annual Preventative Screening Examination  2. Essential hypertension  - EKG 12-Lead - [place], retroperitnl abd,  ltd - Urinalysis, Routine w reflex microscopic - Microalbumin / Creatinine Urine Ratio - CBC with Diff - COMPLETE METABOLIC PANEL WITH GFR - Magnesium - TSH  3. Hyperlipidemia, mixed  - EKG 12-Lead - [place], retroperitnl abd,  ltd - Lipid Profile - TSH  4. Abnormal glucose  - EKG 12-Lead - [place], retroperitnl abd,  ltd - Hemoglobin A1c ([facility]) - Insulin, random  5. Vitamin D deficiency  - Vitamin D (25 hydroxy)  6. Prediabetes  7. Hyperuricemia  - Uric acid  8. Anxiety  - escitalopram (LEXAPRO) 20 MG tablet; Take 1 tablet Daily for Anxiety  Dispense: 90 tablet; Refill: 3  9. Screening for colorectal cancer  - POC Hemoccult Bld/Stl (3-Cd Home Screen); Future  10. Screening examination for pulmonary tuberculosis  - TB Skin Test  11. Screening for ischemic heart disease  - EKG 12-Lead  12. FHx: heart disease  - EKG 12-Lead - [place], retroperitnl abd,  ltd  13. Screening for AAA (aortic abdominal aneurysm  - [place], retroperitnl abd,  ltd  14. Need for immunization against influenza  - FLU VACCINE MDCK QUAD W/Preservative  15. Fatigue, unspecified type  - Iron,Total/Total Iron Binding Cap - Vitamin B12 - CBC with Diff - TSH  16. Medication management  - Urinalysis, Routine w reflex microscopic - Microalbumin / Creatinine Urine Ratio - Uric acid - CBC with Diff - COMPLETE METABOLIC PANEL WITH GFR - Magnesium - Lipid Profile - TSH - Hemoglobin A1c ([facility]) - Insulin, random - Vitamin D (25 hydroxy)       Patient was counseled in prudent diet to  achieve/maintain BMI less than 25 for weight control, BP monitoring, regular exercise and medications. Discussed med's effects and SE's. Screening labs and tests as requested with regular follow-up as recommended. Over 40 minutes of exam, counseling, chart review and high complex critical decision making was performed.  Kirtland Bouchard, MD

## 2019-05-18 ENCOUNTER — Ambulatory Visit (INDEPENDENT_AMBULATORY_CARE_PROVIDER_SITE_OTHER): Payer: No Typology Code available for payment source | Admitting: Internal Medicine

## 2019-05-18 ENCOUNTER — Other Ambulatory Visit: Payer: Self-pay

## 2019-05-18 VITALS — BP 116/72 | HR 52 | Temp 97.0°F | Resp 16 | Ht 67.0 in | Wt 145.2 lb

## 2019-05-18 DIAGNOSIS — E782 Mixed hyperlipidemia: Secondary | ICD-10-CM

## 2019-05-18 DIAGNOSIS — I10 Essential (primary) hypertension: Secondary | ICD-10-CM

## 2019-05-18 DIAGNOSIS — Z111 Encounter for screening for respiratory tuberculosis: Secondary | ICD-10-CM

## 2019-05-18 DIAGNOSIS — Z0000 Encounter for general adult medical examination without abnormal findings: Secondary | ICD-10-CM | POA: Diagnosis not present

## 2019-05-18 DIAGNOSIS — R7303 Prediabetes: Secondary | ICD-10-CM

## 2019-05-18 DIAGNOSIS — Z8249 Family history of ischemic heart disease and other diseases of the circulatory system: Secondary | ICD-10-CM | POA: Diagnosis not present

## 2019-05-18 DIAGNOSIS — Z136 Encounter for screening for cardiovascular disorders: Secondary | ICD-10-CM | POA: Diagnosis not present

## 2019-05-18 DIAGNOSIS — R7309 Other abnormal glucose: Secondary | ICD-10-CM

## 2019-05-18 DIAGNOSIS — Z23 Encounter for immunization: Secondary | ICD-10-CM | POA: Diagnosis not present

## 2019-05-18 DIAGNOSIS — Z79899 Other long term (current) drug therapy: Secondary | ICD-10-CM

## 2019-05-18 DIAGNOSIS — F419 Anxiety disorder, unspecified: Secondary | ICD-10-CM

## 2019-05-18 DIAGNOSIS — Z1211 Encounter for screening for malignant neoplasm of colon: Secondary | ICD-10-CM

## 2019-05-18 DIAGNOSIS — E790 Hyperuricemia without signs of inflammatory arthritis and tophaceous disease: Secondary | ICD-10-CM

## 2019-05-18 DIAGNOSIS — E559 Vitamin D deficiency, unspecified: Secondary | ICD-10-CM

## 2019-05-18 DIAGNOSIS — Z0001 Encounter for general adult medical examination with abnormal findings: Secondary | ICD-10-CM

## 2019-05-18 DIAGNOSIS — R5383 Other fatigue: Secondary | ICD-10-CM

## 2019-05-18 MED ORDER — ESCITALOPRAM OXALATE 20 MG PO TABS: ORAL_TABLET | ORAL | 3 refills | Status: DC

## 2019-05-20 ENCOUNTER — Other Ambulatory Visit: Payer: Self-pay | Admitting: Internal Medicine

## 2019-05-20 ENCOUNTER — Encounter: Payer: Self-pay | Admitting: Internal Medicine

## 2019-05-20 DIAGNOSIS — N289 Disorder of kidney and ureter, unspecified: Secondary | ICD-10-CM

## 2019-05-20 LAB — CBC WITH DIFFERENTIAL/PLATELET
Absolute Monocytes: 553 cells/uL (ref 200–950)
Basophils Absolute: 77 cells/uL (ref 0–200)
Basophils Relative: 0.9 %
Eosinophils Absolute: 77 cells/uL (ref 15–500)
Eosinophils Relative: 0.9 %
HCT: 39.6 % (ref 35.0–45.0)
Hemoglobin: 13.8 g/dL (ref 11.7–15.5)
Lymphs Abs: 3460 cells/uL (ref 850–3900)
MCH: 34 pg — ABNORMAL HIGH (ref 27.0–33.0)
MCHC: 34.8 g/dL (ref 32.0–36.0)
MCV: 97.5 fL (ref 80.0–100.0)
MPV: 13 fL — ABNORMAL HIGH (ref 7.5–12.5)
Monocytes Relative: 6.5 %
Neutro Abs: 4335 cells/uL (ref 1500–7800)
Neutrophils Relative %: 51 %
Platelets: 299 10*3/uL (ref 140–400)
RBC: 4.06 10*6/uL (ref 3.80–5.10)
RDW: 12.5 % (ref 11.0–15.0)
Total Lymphocyte: 40.7 %
WBC: 8.5 10*3/uL (ref 3.8–10.8)

## 2019-05-20 LAB — LIPID PANEL
Cholesterol: 136 mg/dL (ref <200)
HDL: 62 mg/dL (ref >=50)
LDL Cholesterol (Calc): 53 mg/dL (calc)
Non-HDL Cholesterol (Calc): 74 mg/dL (calc) (ref <130)
Total CHOL/HDL Ratio: 2.2 (calc) (ref <5.0)
Triglycerides: 126 mg/dL (ref <150)

## 2019-05-20 LAB — COMPLETE METABOLIC PANEL WITH GFR
AG Ratio: 1.7 (calc) (ref 1.0–2.5)
ALT: 23 U/L (ref 6–29)
AST: 31 U/L (ref 10–35)
Albumin: 4.5 g/dL (ref 3.6–5.1)
Alkaline phosphatase (APISO): 84 U/L (ref 37–153)
BUN/Creatinine Ratio: 24 (calc) — ABNORMAL HIGH (ref 6–22)
BUN: 27 mg/dL — ABNORMAL HIGH (ref 7–25)
CO2: 31 mmol/L (ref 20–32)
Calcium: 10.2 mg/dL (ref 8.6–10.4)
Chloride: 100 mmol/L (ref 98–110)
Creat: 1.13 mg/dL — ABNORMAL HIGH (ref 0.50–0.99)
GFR, Est African American: 59 mL/min/{1.73_m2} — ABNORMAL LOW (ref >=60)
GFR, Est Non African American: 51 mL/min/{1.73_m2} — ABNORMAL LOW (ref >=60)
Globulin: 2.7 g/dL (calc) (ref 1.9–3.7)
Glucose, Bld: 80 mg/dL (ref 65–99)
Potassium: 4.1 mmol/L (ref 3.5–5.3)
Sodium: 142 mmol/L (ref 135–146)
Total Bilirubin: 0.7 mg/dL (ref 0.2–1.2)
Total Protein: 7.2 g/dL (ref 6.1–8.1)

## 2019-05-20 LAB — URINALYSIS, ROUTINE W REFLEX MICROSCOPIC
Bacteria, UA: NONE SEEN /HPF
Bilirubin Urine: NEGATIVE
Glucose, UA: NEGATIVE
Hgb urine dipstick: NEGATIVE
Hyaline Cast: NONE SEEN /LPF
Ketones, ur: NEGATIVE
Nitrite: NEGATIVE
Protein, ur: NEGATIVE
RBC / HPF: NONE SEEN /HPF (ref 0–2)
Specific Gravity, Urine: 1.012 (ref 1.001–1.03)
Squamous Epithelial / HPF: NONE SEEN /HPF (ref <=5)
pH: 7.5 (ref 5.0–8.0)

## 2019-05-20 LAB — VITAMIN D 25 HYDROXY (VIT D DEFICIENCY, FRACTURES): Vit D, 25-Hydroxy: 80 ng/mL (ref 30–100)

## 2019-05-20 LAB — MICROALBUMIN / CREATININE URINE RATIO
Creatinine, Urine: 69 mg/dL (ref 20–275)
Microalb Creat Ratio: 26 mcg/mg creat (ref <30)
Microalb, Ur: 1.8 mg/dL

## 2019-05-20 LAB — IRON, TOTAL/TOTAL IRON BINDING CAP
%SAT: 27 % (calc) (ref 16–45)
Iron: 94 ug/dL (ref 45–160)
TIBC: 353 mcg/dL (calc) (ref 250–450)

## 2019-05-20 LAB — TSH: TSH: 2.02 mIU/L (ref 0.40–4.50)

## 2019-05-20 LAB — URIC ACID: Uric Acid, Serum: 3.4 mg/dL (ref 2.5–7.0)

## 2019-05-20 LAB — HEMOGLOBIN A1C
Hgb A1c MFr Bld: 5.1 % of total Hgb (ref <5.7)
Mean Plasma Glucose: 100 (calc)
eAG (mmol/L): 5.5 (calc)

## 2019-05-20 LAB — VITAMIN B12: Vitamin B-12: 865 pg/mL (ref 200–1100)

## 2019-05-20 LAB — MAGNESIUM: Magnesium: 2.2 mg/dL (ref 1.5–2.5)

## 2019-05-20 LAB — INSULIN, RANDOM: Insulin: 6 u[IU]/mL

## 2019-05-20 NOTE — Progress Notes (Signed)
==============================================================  -    Iron Level & Vit B12 Level are both Normal  ==============================================================  -  Uric Acid / Gout test is Normal - Please continue Allopurinol ==============================================================  -  Kidney tests appear Dehydrated   - Recommend Decrease your Bumex / Fluid Pill to only 1 x /day   - and   - Very Important to drink at least 6 bottles (16 oz) of water or liquids / day to prevent  Permanent Kidney Damage   - 6 bottles x 16 oz = 967 oz liquids or fluids  /day, So, the Benchmark is  100 oz /day of  Fluids  - Also , recommend that you schedule a [facility] for labs to recheck   Kidney Functions in about 3 weeks between Dec 21st  to Dec 31st ==============================================================  -  Chol - 136 - and LDL - 53 - Both Much much Better - Excellent   - Very low risk for Heart Attack  / Stroke ==============================================================  -  A1c - Normal - Great - No Diabetes ==============================================================  -  Vit D - 80 - Excellent  ==============================================================  -  All Else - CBC - Kidneys - Electrolytes -  Liver - Magnesium & Thyroid  - all  Normal / OK ==============================================================  -  Keep up the [place] Work ! ==============================================================

## 2019-06-23 ENCOUNTER — Other Ambulatory Visit: Payer: Self-pay | Admitting: *Deleted

## 2019-06-23 DIAGNOSIS — K219 Gastro-esophageal reflux disease without esophagitis: Secondary | ICD-10-CM

## 2019-06-23 MED ORDER — FAMOTIDINE 40 MG PO TABS: ORAL_TABLET | ORAL | 3 refills | Status: DC

## 2019-06-27 ENCOUNTER — Other Ambulatory Visit: Payer: No Typology Code available for payment source

## 2019-06-27 ENCOUNTER — Other Ambulatory Visit: Payer: Self-pay

## 2019-06-27 DIAGNOSIS — N289 Disorder of kidney and ureter, unspecified: Secondary | ICD-10-CM

## 2019-06-28 LAB — BASIC METABOLIC PANEL WITH GFR
BUN/Creatinine Ratio: 20 (calc) (ref 6–22)
BUN: 22 mg/dL (ref 7–25)
CO2: 28 mmol/L (ref 20–32)
Calcium: 10.1 mg/dL (ref 8.6–10.4)
Chloride: 101 mmol/L (ref 98–110)
Creat: 1.12 mg/dL — ABNORMAL HIGH (ref 0.50–0.99)
GFR, Est African American: 60 mL/min/{1.73_m2} (ref >=60)
GFR, Est Non African American: 52 mL/min/{1.73_m2} — ABNORMAL LOW (ref >=60)
Glucose, Bld: 76 mg/dL (ref 65–99)
Potassium: 4.3 mmol/L (ref 3.5–5.3)
Sodium: 141 mmol/L (ref 135–146)

## 2019-08-04 ENCOUNTER — Encounter: Payer: Self-pay | Admitting: Internal Medicine

## 2019-08-17 ENCOUNTER — Ambulatory Visit (AMBULATORY_SURGERY_CENTER): Payer: Self-pay

## 2019-08-17 ENCOUNTER — Other Ambulatory Visit: Payer: Self-pay

## 2019-08-17 VITALS — Temp 97.1°F | Ht 67.0 in | Wt 148.0 lb

## 2019-08-17 DIAGNOSIS — Z800 Family history of malignant neoplasm of digestive organs: Secondary | ICD-10-CM

## 2019-08-17 DIAGNOSIS — Z01818 Encounter for other preprocedural examination: Secondary | ICD-10-CM

## 2019-08-17 DIAGNOSIS — Z86010 Personal history of colonic polyps: Secondary | ICD-10-CM

## 2019-08-17 MED ORDER — NA SULFATE-K SULFATE-MG SULF 17.5-3.13-1.6 GM/177ML PO SOLN: 1.0000 | Freq: Once | ORAL | 0 refills | Status: AC

## 2019-08-17 NOTE — Progress Notes (Signed)
No egg or soy allergy known to patient  No issues with past sedation with any surgeries  or procedures, no intubation problems  No diet pills per patient No home 02 use per patient  No blood thinners per patient  Pt denies issues with constipation  No A fib or A flutter  EMMI video sent to pt's e mail   Pt states bm every one to two days, denies need for extra prep   suprep code and coupon given  Due to the COVID-19 pandemic we are asking patients to follow these guidelines. Please only bring one care partner. Please be aware that your care partner may wait in the car in the parking lot or if they feel like they will be too hot to wait in the car, they may wait in the lobby on the 4th floor. All care partners are required to wear a mask the entire time (we do not have any that we can provide them), they need to practice social distancing, and we will do a Covid check for all patient's and care partners when you arrive. Also we will check their temperature and your temperature. If the care partner waits in their car they need to stay in the parking lot the entire time and we will call them on their cell phone when the patient is ready for discharge so they can bring the car to the front of the building. Also all patient's will need to wear a mask into building.

## 2019-08-23 DIAGNOSIS — F419 Anxiety disorder, unspecified: Secondary | ICD-10-CM | POA: Insufficient documentation

## 2019-08-23 NOTE — Progress Notes (Signed)
FOLLOW UP  Assessment and Plan:   Hypertension Well controlled with current medications  Monitor blood pressure at home; patient to call if consistently greater than 130/80 Continue DASH diet.   Reminder to go to the ER if any CP, SOB, nausea, dizziness, severe HA, changes vision/speech, left arm numbness and tingling and jaw pain.  Cholesterol Currently at goal; continue rosuvastatin  LDL goal <100 Continue low cholesterol diet and exercise.  Defer lipid panel.   History of abnormal glucose Recent A1Cs well controlled Continue diet and exercise.  Perform daily foot/skin check, notify office of any concerning changes.  Defer A1C; annual check sufficient; check CMP  Vitamin D Def At goal at last visit; continue supplementation to maintain goal of 60-100 Defer Vit D level  GERD Well managed on current medications  Discussed diet, avoiding triggers and other lifestyle changes  Hyperuricemia Continue allopurinol Diet discussed  Check uric acid as needed  IBS with both diarrhea and constipation If not on benefiber then add it, decrease stress,  if any worsening symptoms, blood in stool, AB pain, etc call office FODMAP information provided; patient to keep food journal and try elimination diet if stopping lexapro does not improve diarrhea Colonoscopy scheduled next week with Dr. Hilarie Fredrickson which will r/o colitis, has been normal in the past on numerous colonoscopies  Anxiety Try stopping lexapro 10 mg for 2-4 weeks to evaluate diarrhea; if resolves, consider zoloft or effexor instead;  Stress management techniques discussed, increase water, good sleep hygiene discussed, increase exercise, and increase veggies.    Continue diet and meds as discussed. Further disposition pending results of labs. Discussed med's effects and SE's.   Over 30 minutes of exam, counseling, chart review, and critical decision making was performed.   Future Appointments  Date Time Provider [place]  08/29/2019  2:20 PM [facility] PRE[facility] RM 68 [facility]  09/01/2019 11:30 AM Pyrtle, Lajuan Lines, MD [facility]  11/29/2019  3:30 PM Unk Pinto, MD [facility] None  06/14/2020  3:00 PM Unk Pinto, MD [facility] None    ----------------------------------------------------------------------------------------------------------------------  HPI 64 y.o. female  presents for 3 month follow up on hypertension, cholesterol, history of abnormal glucose, and vitamin D deficiency.   She had cyst of upper back removed by Dr. Melford Aase in November 2021 and has done well since.   She also has ongoing issues with GI, + hx with GERD, IBS-D with vague abdominal symptoms, Typically has constipation, but recently in last month reports has had persistent diarrhea, watery to soft, new since starting lexapro. Denies mucus, blood. Has used imodium but limiting due to fear of constipation.   She is on lexapro 10 mg daily for anxiety   she has a diagnosis of GERD which is currently managed by famotidine  she reports symptoms is currently well controlled, and denies breakthrough reflux, burning in chest, hoarseness or cough.    BMI is Body mass index is 23.49 kg/m., she has been working on diet but not exercise. Wt Readings from Last 3 Encounters:  08/24/19 150 lb (68 kg)  08/17/19 148 lb (67.1 kg)  05/18/19 145 lb 3.2 oz (65.9 kg)   Her blood pressure has been controlled at home, today their BP is BP: 122/70  She does not workout. She denies chest pain, shortness of breath, dizziness.   She is on cholesterol medication (rosuvastatin 20 mg daily) and denies myalgias. Her cholesterol is at goal. The cholesterol last visit was:   Lab Results  Component Value Date  CHOL 136 05/18/2019   HDL 62 05/18/2019   LDLCALC 53 05/18/2019   TRIG 126 05/18/2019   CHOLHDL 2.2 05/18/2019    She has been working on diet and exercise for glucose management, and denies increased appetite, nausea,  paresthesia of the feet, polydipsia, polyuria, visual disturbances and vomiting. Last A1C in the office was:  Lab Results  Component Value Date   HGBA1C 5.1 05/18/2019    Last GFR:  Lab Results  Component Value Date   GFRNONAA 52 (L) 06/27/2019   Patient is on Vitamin D supplement and at goal at recent check:   Lab Results  Component Value Date   VD25OH 80 05/18/2019     Patient is on allopurinol for hyperuricemia/gout and does not report a recent flare.  Lab Results  Component Value Date   LABURIC 3.4 05/18/2019      Current Medications:  Current Outpatient Medications on File Prior to Visit  Medication Sig  . allopurinol (ZYLOPRIM) 300 MG tablet Take 1/2 to 1 tablet Daily to Prevent Gout  . Ascorbic Acid (VITAMIN C WITH ROSE HIPS) 1000 MG tablet Take 1,000 mg by mouth daily.  Marland Kitchen aspirin 81 MG tablet Take 81 mg by mouth every other day.   . bumetanide (BUMEX) 2 MG tablet Take 1 tablet 1 to 2 x /day ONLY if Ankles Swollen to Avoid Kidney Damage from Dehydration  . Cholecalciferol (VITAMIN D3) 5000 units CAPS Takes 1 cap daily  . escitalopram (LEXAPRO) 20 MG tablet Take 1 tablet Daily for Anxiety  . famotidine (PEPCID) 40 MG tablet Take 1 tablet at bedtime for Acid Reflux  . fluticasone (FLONASE) 50 MCG/ACT nasal spray Place 1 spray into both nostrils daily.  . Magnesium 250 MG TABS Take 2 tablets by mouth 2 (two) times daily.  . Multiple Vitamin (MULTIVITAMIN) tablet Take 1 tablet by mouth daily.  Marland Kitchen OVER THE COUNTER MEDICATION Takes OTC Allegra for allergies.  . potassium chloride SA (KLOR-CON) 20 MEQ tablet Take 1 tablet 2 x /day for Potassium  . rosuvastatin (CRESTOR) 20 MG tablet Take 1 tablet daily for Cholesterol  . traMADol (ULTRAM) 50 MG tablet Take 1 tablet every 4 hours as needed for Pain   No current facility-administered medications on file prior to visit.     Allergies:  Allergies  Allergen Reactions  . Alprazolam Other (See Comments)     Unknown reaction  per pt  . Prednisone     Only high doses     Medical History:  Past Medical History:  Diagnosis Date  . Abnormal glucose 02/05/2015  . Allergy    seasonal  . Contact lens/glasses fitting    wears contacts or glasses  . GERD (gastroesophageal reflux disease)   . Hyperlipidemia   . Hypertension   . Hyperuricemia 08/13/2014  . Irritable bowel syndrome 10/18/2007  . Irritable bowel syndrome (IBS)   . Medication management 12/26/2013  . Peripheral neuropathy    in feet  . POLYP, COLON 10/18/2007  . PONV (postoperative nausea and vomiting)    nausea, slow to wake up at times  . Vitamin D deficiency    Family history- Reviewed and unchanged Social history- Reviewed and unchanged   Review of Systems:  Review of Systems  Constitutional: Negative for malaise/fatigue and weight loss.  HENT: Negative for hearing loss and tinnitus.   Eyes: Negative for blurred vision and double vision.  Respiratory: Negative for cough, shortness of breath and wheezing.   Cardiovascular: Negative for chest pain, palpitations,  orthopnea, claudication and leg swelling.  Gastrointestinal: Positive for diarrhea (soft to watery x 3 months ). Negative for abdominal pain, blood in stool, constipation, heartburn, melena, nausea and vomiting.  Genitourinary: Negative.   Musculoskeletal: Negative for joint pain and myalgias.  Skin: Negative for rash.  Neurological: Negative for dizziness, tingling, sensory change, weakness and headaches.  Endo/Heme/Allergies: Negative for polydipsia.  Psychiatric/Behavioral: Negative.   All other systems reviewed and are negative.   Physical Exam: BP 122/70   Pulse (!) 46   Temp (!) 96.3 F (35.7 C)   Wt 150 lb (68 kg)   SpO2 98%   BMI 23.49 kg/m  Wt Readings from Last 3 Encounters:  08/24/19 150 lb (68 kg)  08/17/19 148 lb (67.1 kg)  05/18/19 145 lb 3.2 oz (65.9 kg)   General Appearance: Well nourished, in no apparent distress. Eyes: PERRLA, EOMs, conjunctiva no  swelling or erythema Sinuses: No Frontal/maxillary tenderness ENT/Mouth: Ext aud canals clear, TMs without erythema, bulging. No erythema, swelling, or exudate on post pharynx.  Tonsils not swollen or erythematous. Hearing normal.  Neck: Supple, thyroid normal.  Respiratory: Respiratory effort normal, BS equal bilaterally without rales, rhonchi, wheezing or stridor.  Cardio: RRR with no MRGs. Brisk peripheral pulses without edema.  Abdomen: Soft, + BS.  Non tender, no guarding, rebound, hernias, masses. Lymphatics: Non tender without lymphadenopathy.  Musculoskeletal: Full ROM, 5/5 strength, Normal gait Skin: Warm, dry without rashes, lesions, ecchymosis.  Neuro: Cranial nerves intact. No cerebellar symptoms.  Psych: Awake and oriented X 3, normal affect, Insight and Judgment appropriate.    [name], NP 3:52 PM [facility]

## 2019-08-24 ENCOUNTER — Other Ambulatory Visit: Payer: Self-pay

## 2019-08-24 ENCOUNTER — Ambulatory Visit: Payer: No Typology Code available for payment source | Admitting: Adult Health

## 2019-08-24 ENCOUNTER — Encounter: Payer: Self-pay | Admitting: Adult Health

## 2019-08-24 VITALS — BP 122/70 | HR 46 | Temp 96.3°F | Wt 150.0 lb

## 2019-08-24 DIAGNOSIS — K219 Gastro-esophageal reflux disease without esophagitis: Secondary | ICD-10-CM

## 2019-08-24 DIAGNOSIS — E559 Vitamin D deficiency, unspecified: Secondary | ICD-10-CM | POA: Diagnosis not present

## 2019-08-24 DIAGNOSIS — E790 Hyperuricemia without signs of inflammatory arthritis and tophaceous disease: Secondary | ICD-10-CM

## 2019-08-24 DIAGNOSIS — I10 Essential (primary) hypertension: Secondary | ICD-10-CM | POA: Diagnosis not present

## 2019-08-24 DIAGNOSIS — E782 Mixed hyperlipidemia: Secondary | ICD-10-CM

## 2019-08-24 DIAGNOSIS — F419 Anxiety disorder, unspecified: Secondary | ICD-10-CM

## 2019-08-24 DIAGNOSIS — R7309 Other abnormal glucose: Secondary | ICD-10-CM

## 2019-08-24 DIAGNOSIS — Z6823 Body mass index (BMI) 23.0-23.9, adult: Secondary | ICD-10-CM

## 2019-08-24 NOTE — Patient Instructions (Addendum)
    Try stopping lexapro for 2-4 weeks  See what colonoscopy shows - hopefully no colitis    If diarrhea is severe - 4+ watery diarrhea episodes, take imodium to reduce fluid loss, and start on electrolyte supplemented fluids.   Please go to the ER if you have any severe AB pain, unable to hold down food/water, blood in stool or vomit, chest pain, shortness of breath, or any worsening symptoms.   Consider keeping a food diary- common causes of diarrhea are dairy, certain carbs...  FODMAP stands for fermentable oligo-, di-, mono-saccharides and polyols (1). These are the scientific terms used to classify groups of carbs that are notorious for triggering digestive symptoms like bloating, gas and stomach pain.   FODMAPs are found in a wide range of foods in varying amounts. Some foods contain just one type, while others contain several.  The main dietary sources of the four groups of FODMAPs include:  Oligosaccharides: Wheat, rye, legumes and various fruits and vegetables, such as garlic and onions.  Disaccharides: Milk, yogurt and soft cheese. Lactose is the main carb.  Monosaccharides: Various fruit including figs and mangoes, and sweeteners such as honey and agave nectar. Fructose is the main carb.  Polyols: Certain fruits and vegetables including blackberries and lychee, as well as some low-calorie sweeteners like those in sugar-free gum.   Keep a food diary. This will help you identify foods that cause symptoms. Write down: ? What you eat and when. ? What symptoms you have. ? When symptoms occur in relation to your meals.  Avoid foods that cause symptoms. Talk with your dietitian about other ways to get the same nutrients that are in these foods.  Eat your meals slowly, in a relaxed setting.  Aim to eat 5-6 small meals per day. Do not skip meals.  Drink enough fluids to keep your urine clear or pale yellow.  Ask your health care provider if you should take an  over-the-counter probiotic during flare-ups to help restore healthy gut bacteria.  If you have cramping or diarrhea, try making your meals low in fat and high in carbohydrates. Examples of carbohydrates are pasta, rice, whole grain breads and cereals, fruits, and vegetables.  If dairy products cause your symptoms to flare up, try eating less of them. You might be able to handle yogurt better than other dairy products because it contains bacteria that help with digestion.

## 2019-08-25 LAB — CBC WITH DIFFERENTIAL/PLATELET
Absolute Monocytes: 615 cells/uL (ref 200–950)
Basophils Absolute: 83 cells/uL (ref 0–200)
Basophils Relative: 1.1 %
Eosinophils Absolute: 90 cells/uL (ref 15–500)
Eosinophils Relative: 1.2 %
HCT: 42.8 % (ref 35.0–45.0)
Hemoglobin: 14.6 g/dL (ref 11.7–15.5)
Lymphs Abs: 3570 cells/uL (ref 850–3900)
MCH: 33 pg (ref 27.0–33.0)
MCHC: 34.1 g/dL (ref 32.0–36.0)
MCV: 96.8 fL (ref 80.0–100.0)
MPV: 12.9 fL — ABNORMAL HIGH (ref 7.5–12.5)
Monocytes Relative: 8.2 %
Neutro Abs: 3143 cells/uL (ref 1500–7800)
Neutrophils Relative %: 41.9 %
Platelets: 270 10*3/uL (ref 140–400)
RBC: 4.42 10*6/uL (ref 3.80–5.10)
RDW: 12 % (ref 11.0–15.0)
Total Lymphocyte: 47.6 %
WBC: 7.5 10*3/uL (ref 3.8–10.8)

## 2019-08-25 LAB — COMPLETE METABOLIC PANEL WITH GFR
AG Ratio: 1.9 (calc) (ref 1.0–2.5)
ALT: 25 U/L (ref 6–29)
AST: 34 U/L (ref 10–35)
Albumin: 4.4 g/dL (ref 3.6–5.1)
Alkaline phosphatase (APISO): 74 U/L (ref 37–153)
BUN: 22 mg/dL (ref 7–25)
CO2: 30 mmol/L (ref 20–32)
Calcium: 9.8 mg/dL (ref 8.6–10.4)
Chloride: 103 mmol/L (ref 98–110)
Creat: 0.97 mg/dL (ref 0.50–0.99)
GFR, Est African American: 72 mL/min/{1.73_m2} (ref >=60)
GFR, Est Non African American: 62 mL/min/{1.73_m2} (ref >=60)
Globulin: 2.3 g/dL (calc) (ref 1.9–3.7)
Glucose, Bld: 69 mg/dL (ref 65–99)
Potassium: 4.1 mmol/L (ref 3.5–5.3)
Sodium: 140 mmol/L (ref 135–146)
Total Bilirubin: 1 mg/dL (ref 0.2–1.2)
Total Protein: 6.7 g/dL (ref 6.1–8.1)

## 2019-08-25 LAB — TSH: TSH: 2.12 mIU/L (ref 0.40–4.50)

## 2019-08-25 LAB — LIPID PANEL
Cholesterol: 142 mg/dL (ref <200)
HDL: 62 mg/dL (ref >=50)
LDL Cholesterol (Calc): 62 mg/dL (calc)
Non-HDL Cholesterol (Calc): 80 mg/dL (calc) (ref <130)
Total CHOL/HDL Ratio: 2.3 (calc) (ref <5.0)
Triglycerides: 98 mg/dL (ref <150)

## 2019-08-25 LAB — MAGNESIUM: Magnesium: 2.2 mg/dL (ref 1.5–2.5)

## 2019-08-29 ENCOUNTER — Ambulatory Visit (INDEPENDENT_AMBULATORY_CARE_PROVIDER_SITE_OTHER): Payer: No Typology Code available for payment source

## 2019-08-29 ENCOUNTER — Other Ambulatory Visit: Payer: Self-pay | Admitting: Internal Medicine

## 2019-08-29 ENCOUNTER — Other Ambulatory Visit: Payer: Self-pay

## 2019-08-29 DIAGNOSIS — Z1159 Encounter for screening for other viral diseases: Secondary | ICD-10-CM

## 2019-08-30 ENCOUNTER — Encounter: Payer: Self-pay | Admitting: Internal Medicine

## 2019-08-30 LAB — SARS CORONAVIRUS 2 (TAT 6-24 HRS): SARS Coronavirus 2: NEGATIVE

## 2019-09-01 ENCOUNTER — Ambulatory Visit (AMBULATORY_SURGERY_CENTER): Payer: No Typology Code available for payment source | Admitting: Internal Medicine

## 2019-09-01 ENCOUNTER — Other Ambulatory Visit: Payer: Self-pay

## 2019-09-01 ENCOUNTER — Encounter: Payer: Self-pay | Admitting: Internal Medicine

## 2019-09-01 VITALS — BP 98/53 | HR 46 | Temp 95.3°F | Resp 12 | Ht 67.0 in | Wt 148.0 lb

## 2019-09-01 DIAGNOSIS — Z86010 Personal history of colonic polyps: Secondary | ICD-10-CM | POA: Diagnosis present

## 2019-09-01 DIAGNOSIS — D123 Benign neoplasm of transverse colon: Secondary | ICD-10-CM

## 2019-09-01 MED ORDER — SODIUM CHLORIDE 0.9 % IV SOLN: 500.0000 mL | Freq: Once | INTRAVENOUS | Status: DC

## 2019-09-01 NOTE — Progress Notes (Signed)
To PACU, VSS. Report to [email or web address] 

## 2019-09-01 NOTE — Op Note (Signed)
[facility] Patient Name: Katie Cox Procedure Date: 09/01/2019 10:48 AM MRN: [id number] Endoscopist: Jerene Bears , MD Age: 64 Referring MD:  Date of Birth: Feb 19, 1956 Gender: Female Account #: [id number] Procedure:                Colonoscopy Indications:              Surveillance: Personal history of adenomatous                            polyps, last colonoscopy 3 years ago (large adenoma                            removed by EMR in Oct 2017, last colonoscopy Feb                            2018) Medicines:                Monitored Anesthesia Care Procedure:                Pre-Anesthesia Assessment:                           - Prior to the procedure, a History and Physical                            was performed, and patient medications and                            allergies were reviewed. The patient's tolerance of                            previous anesthesia was also reviewed. The risks                            and benefits of the procedure and the sedation                            options and risks were discussed with the patient.                            All questions were answered, and informed consent                            was obtained. Prior Anticoagulants: The patient has                            taken no previous anticoagulant or antiplatelet                            agents. ASA Grade Assessment: II - A patient with                            mild systemic disease. After reviewing the risks  and benefits, the patient was deemed in                            satisfactory condition to undergo the procedure.                           After obtaining informed consent, the colonoscope                            was passed under direct vision. Throughout the                            procedure, the patient's blood pressure, pulse, and                            oxygen saturations were monitored continuously. The                     Colonoscope was introduced through the anus and                            advanced to the cecum, identified by appendiceal                            orifice and ileocecal valve. The colonoscopy was                            performed without difficulty. The patient tolerated                            the procedure well. The quality of the bowel                            preparation was good. The ileocecal valve,                            appendiceal orifice, and rectum were photographed. Scope In: [id number] AM Scope Out: 11:28:09 AM Scope Withdrawal Time: 0 hours 15 minutes 30 seconds  Total Procedure Duration: 0 hours 21 minutes 21 seconds  Findings:                 The digital rectal exam was normal.                           A tattoo was seen at the hepatic flexure. There was                            no evidence of residual polyp tissue.                           Two sessile polyps were found in the transverse                            colon. The polyps were 2 to 5 mm in size. These  polyps were removed with a cold snare. Resection                            and retrieval were complete.                           Multiple small-mouthed diverticula were found in                            the sigmoid colon and hepatic flexure.                           Internal hemorrhoids were found during                            retroflexion. The hemorrhoids were small. Complications:            No immediate complications. Estimated Blood Loss:     Estimated blood loss: none. Impression:               - A tattoo was seen at the hepatic flexure. A                            post-polypectomy scar was found at the tattoo site.                            There was no evidence of residual polyp tissue.                           - Two 2 to 5 mm polyps in the transverse colon,                            removed with a cold snare. Resected and retrieved.                            - Diverticulosis in the sigmoid colon and at the                            hepatic flexure.                           - Small internal hemorrhoids. Recommendation:           - Patient has a contact number available for                            emergencies. The signs and symptoms of potential                            delayed complications were discussed with the                            patient. Return to normal activities tomorrow.  Written discharge instructions were provided to the                            patient.                           - Resume previous diet.                           - Continue present medications.                           - Await pathology results.                           - Repeat colonoscopy is recommended for                            surveillance. The colonoscopy date will be                            determined after pathology results from today's                            exam become available for review. Jerene Bears, MD 09/01/2019 11:36:31 AM This report has been signed electronically.

## 2019-09-01 NOTE — Progress Notes (Signed)
Called to room to assist during endoscopic procedure.  Patient ID and intended procedure confirmed with present staff. Received instructions for my participation in the procedure from the performing physician.  

## 2019-09-01 NOTE — Patient Instructions (Signed)
Handouts given for polyps, diverticulosis and hemorrhoids.  Await pathology results.  YOU HAD AN ENDOSCOPIC PROCEDURE TODAY AT THE [facility]:   Refer to the procedure report that was given to you for any specific questions about what was found during the examination.  If the procedure report does not answer your questions, please call your gastroenterologist to clarify.  If you requested that your care partner not be given the details of your procedure findings, then the procedure report has been included in a sealed envelope for you to review at your convenience later.  YOU SHOULD EXPECT: Some feelings of bloating in the abdomen. Passage of more gas than usual.  Walking can help get rid of the air that was put into your GI tract during the procedure and reduce the bloating. If you had a lower endoscopy (such as a colonoscopy or flexible sigmoidoscopy) you may notice spotting of blood in your stool or on the toilet paper. If you underwent a bowel prep for your procedure, you may not have a normal bowel movement for a few days.  Please Note:  You might notice some irritation and congestion in your nose or some drainage.  This is from the oxygen used during your procedure.  There is no need for concern and it should clear up in a day or so.  SYMPTOMS TO REPORT IMMEDIATELY:   Following lower endoscopy (colonoscopy or flexible sigmoidoscopy):  Excessive amounts of blood in the stool  Significant tenderness or worsening of abdominal pains  Swelling of the abdomen that is new, acute  Fever of 100F or higher   For urgent or emergent issues, a gastroenterologist can be reached at any hour by calling [phone]. Do not use MyChart messaging for urgent concerns.    DIET:  We do recommend a small meal at first, but then you may proceed to your regular diet.  Drink plenty of fluids but you should avoid alcoholic beverages for 24 hours.  ACTIVITY:  You should plan to take it easy for  the rest of today and you should NOT DRIVE or use heavy machinery until tomorrow (because of the sedation medicines used during the test).    FOLLOW UP: Our staff will call the number listed on your records 48-72 hours following your procedure to check on you and address any questions or concerns that you may have regarding the information given to you following your procedure. If we do not reach you, we will leave a message.  We will attempt to reach you two times.  During this call, we will ask if you have developed any symptoms of COVID 19. If you develop any symptoms (ie: fever, flu-like symptoms, shortness of breath, cough etc.) before then, please call [phone].  If you test positive for Covid 19 in the 2 weeks post procedure, please call and report this information to us.    If any biopsies were taken you will be contacted by phone or by letter within the next 1-3 weeks.  Please call us at [phone] if you have not heard about the biopsies in 3 weeks.    SIGNATURES/CONFIDENTIALITY: You and/or your care partner have signed paperwork which will be entered into your electronic medical record.  These signatures attest to the fact that that the information above on your After Visit Summary has been reviewed and is understood.  Full responsibility of the confidentiality of this discharge information lies with you and/or your care-partner. 

## 2019-09-01 NOTE — Progress Notes (Signed)
Temp check by:LC Vital check by:KA  The patient states no changes in medical or surgical history since pre-visit screening on 08/17/19.

## 2019-09-05 ENCOUNTER — Telehealth: Payer: Self-pay

## 2019-09-05 NOTE — Telephone Encounter (Signed)
  Follow up Call-  Call back number 09/01/2019  Post procedure Call Back phone  # [phone]  Permission to leave phone message Yes  Some recent data might be hidden     Patient questions:  Do you have a fever, pain , or abdominal swelling? No. Pain Score  0 *  Have you tolerated food without any problems? Yes.    Have you been able to return to your normal activities? Yes.    Do you have any questions about your discharge instructions: Diet   No. Medications  No. Follow up visit  No.  Do you have questions or concerns about your Care? No.  Actions: * If pain score is 4 or above: No action needed, pain <4.  1. Have you developed a fever since your procedure? no  2.   Have you had an respiratory symptoms (SOB or cough) since your procedure? no  3.   Have you tested positive for COVID 19 since your procedure no  4.   Have you had any family members/close contacts diagnosed with the COVID 19 since your procedure?  no   If yes to any of these questions please route to Joylene John, RN and Alphonsa Gin, Therapist, sports.

## 2019-09-09 ENCOUNTER — Encounter: Payer: Self-pay | Admitting: Internal Medicine

## 2019-09-19 ENCOUNTER — Ambulatory Visit: Payer: No Typology Code available for payment source | Attending: Internal Medicine

## 2019-09-19 DIAGNOSIS — Z23 Encounter for immunization: Secondary | ICD-10-CM

## 2019-09-19 NOTE — Progress Notes (Signed)
° °  Covid-19 Vaccination Clinic  Name:  Katie Cox    MRN: [id number] DOB: 10-13-1955  09/19/2019  Ms. Schmechel was observed post Covid-19 immunization for 15 minutes without incident. She was provided with Vaccine Information Sheet and instruction to access the V-Safe system.   Ms. Mullins was instructed to call 911 with any severe reactions post vaccine:  Difficulty breathing   Swelling of face and throat   A fast heartbeat   A bad rash all over body   Dizziness and weakness   Immunizations Administered    Name Date Dose VIS Date Route   Pfizer COVID-19 Vaccine 09/19/2019  4:44 PM 0.3 mL 06/03/2019 Intramuscular   Manufacturer: [place]   Lot: [id number]   [place]: [id number]

## 2019-10-08 ENCOUNTER — Other Ambulatory Visit: Payer: Self-pay | Admitting: Internal Medicine

## 2019-10-12 ENCOUNTER — Ambulatory Visit: Payer: No Typology Code available for payment source | Attending: Internal Medicine

## 2019-10-12 DIAGNOSIS — Z23 Encounter for immunization: Secondary | ICD-10-CM

## 2019-10-12 NOTE — Progress Notes (Signed)
   Covid-19 Vaccination Clinic  Name:  Katie Cox    MRN: [id number] DOB: 1956/05/06  10/12/2019  [name] was observed post Covid-19 immunization for 15 minutes without incident. She was provided with Vaccine Information Sheet and instruction to access the V-Safe system.   [name] was instructed to call 911 with any severe reactions post vaccine: Marland Kitchen Difficulty breathing  . Swelling of face and throat  . A fast heartbeat  . A bad rash all over body  . Dizziness and weakness   Immunizations Administered    Name Date Dose VIS Date Route   Pfizer COVID-19 Vaccine 10/12/2019  2:08 PM 0.3 mL 08/17/2018 Intramuscular   Manufacturer: [place]   Lot: [id number]   [place]: [id number]

## 2019-11-01 ENCOUNTER — Telehealth: Payer: Self-pay | Admitting: *Deleted

## 2019-11-01 NOTE — Telephone Encounter (Signed)
Patient called and asked about reducing Bumex 2 mg dosage. She states her ankles are not swelling.  Ok to reduce to every other day or PRN only, if ankles are ok, per Dr Melford Aase.

## 2019-11-28 NOTE — Patient Instructions (Signed)

## 2019-11-28 NOTE — Progress Notes (Signed)
History of Present Illness:       This very nice 64 y.o. DWF presents for 6 month follow up with HTN, HLD, Pre-Diabetes and Vitamin D Deficiency. Patient has hx/o Gout controlled on her Allopurinol. Patient is also treated for GERD & IBS-C, both controlled on her meds.       Patient is treated for HTN (2000) & BP has been controlled at home. Today's BP is at goal - 122/76. Patient has had no complaints of any cardiac type chest pain, palpitations, dyspnea / orthopnea / PND, dizziness, claudication, or dependent edema.      Hyperlipidemia is controlled with diet & Rosuvastatin. Patient denies myalgias or other med SE's. Last Lipids were at goal:  Lab Results  Component Value Date   CHOL 142 08/24/2019   HDL 62 08/24/2019   LDLCALC 62 08/24/2019   TRIG 98 08/24/2019   CHOLHDL 2.3 08/24/2019    Also, the patient is followed expectantly for glucose intolerance in regards to her hx/o peripheral sensory neuropathy. Patient  has had no symptoms of reactive hypoglycemia, diabetic polys or visual blurring, but does have paresthesias of her distal lower extremities.  Last A1c was Normal & at goal:  Lab Results  Component Value Date   HGBA1C 5.1 05/18/2019           Further, the patient also has history of Vitamin D Deficiency ("26"/2008) and supplements vitamin D without any suspected side-effects. Last vitamin D was at goal:  Lab Results  Component Value Date   VD25OH 68 05/18/2019    Current Outpatient Medications on File Prior to Visit  Medication Sig  . allopurinol (ZYLOPRIM) 300 MG tablet Take 1/2 to 1 tablet Daily for Elevated Uric Acid Level & to Prevent Gout  . Ascorbic Acid (VITAMIN C WITH ROSE HIPS) 1000 MG tablet Take 1,000 mg by mouth daily.  Marland Kitchen aspirin 81 MG tablet Take 81 mg by mouth every other day.   . bumetanide (BUMEX) 2 MG tablet Take 1 tablet 1 to 2 x /day ONLY if Ankles Swollen to Avoid Kidney Damage from Dehydration  . famotidine (PEPCID) 40 MG tablet Take 1  tablet at bedtime for Acid Reflux  . Magnesium 250 MG TABS Take 2 tablets by mouth 2 (two) times daily.  . Multiple Vitamin (MULTIVITAMIN) tablet Take 1 tablet by mouth daily.  Marland Kitchen OVER THE COUNTER MEDICATION Takes OTC Allegra for allergies.  . potassium chloride SA (KLOR-CON) 20 MEQ tablet Take 1 tablet 2 x /day for Potassium  . rosuvastatin (CRESTOR) 20 MG tablet Take 1 tablet daily for Cholesterol  . fluticasone (FLONASE) 50 MCG/ACT nasal spray Place 1 spray into both nostrils daily.   No current facility-administered medications on file prior to visit.    Allergies  Allergen Reactions  . Alprazolam Other (See Comments)     Unknown reaction per pt  . Prednisone     Only high doses    PMHx:   Past Medical History:  Diagnosis Date  . Abnormal glucose 02/05/2015  . Allergy    seasonal  . Contact lens/glasses fitting    wears contacts or glasses  . GERD (gastroesophageal reflux disease)   . Hyperlipidemia   . Hypertension   . Hyperuricemia 08/13/2014  . Irritable bowel syndrome 10/18/2007  . Irritable bowel syndrome (IBS)   . Medication management 12/26/2013  . Peripheral neuropathy    in feet  . POLYP, COLON 10/18/2007  . PONV (postoperative nausea and vomiting)    nausea, slow  to wake up at times  . Vitamin D deficiency     Immunization History  Administered Date(s) Administered  . Influenza Inj Mdck Quad With Preservative 04/21/2018, 05/18/2019  . PFIZER SARS-COV-2 Vaccination 09/19/2019, 10/12/2019  . PPD Test 01/25/2014, 02/05/2015, 02/27/2016, 04/21/2018, 05/18/2019  . Pneumococcal Polysaccharide-23 11/23/2008  . Tdap 11/29/2009    Past Surgical History:  Procedure Laterality Date  . carpel tunnel surgery Right   . CHOLECYSTECTOMY  04/14/2012   Procedure: LAPAROSCOPIC CHOLECYSTECTOMY;  Surgeon: Rolm Bookbinder, MD;  Location: [facility];  Service: General;  Laterality: N/A;  . COLONOSCOPY    . EYE SURGERY     at age 64yr  . FOOT SURGERY      both feet - little toes on each foot  . goiter removed fro thyroid    . OPEN REDUCTION INTERNAL FIXATION (ORIF) DISTAL RADIAL FRACTURE Right 07/04/2015   Procedure: OPEN REDUCTION INTERNAL FIXATION (ORIF) RIGHT DISTAL RADIUS FRACTURE;  Surgeon: Iran Planas, MD;  Location: [facility];  Service: Orthopedics;  Laterality: Right;  . POLYPECTOMY    . WISDOM TOOTH EXTRACTION      FHx:    Reviewed / unchanged  SHx:    Reviewed / unchanged   Systems Review:  Constitutional: Denies fever, chills, wt changes, headaches, insomnia, fatigue, night sweats, change in appetite. Eyes: Denies redness, blurred vision, diplopia, discharge, itchy, watery eyes.  ENT: Denies discharge, congestion, post nasal drip, epistaxis, sore throat, earache, hearing loss, dental pain, tinnitus, vertigo, sinus pain, snoring.  CV: Denies chest pain, palpitations, irregular heartbeat, syncope, dyspnea, diaphoresis, orthopnea, PND, claudication or edema. Respiratory: denies cough, dyspnea, DOE, pleurisy, hoarseness, laryngitis, wheezing.  Gastrointestinal: Denies dysphagia, odynophagia, heartburn, reflux, water brash, abdominal pain or cramps, nausea, vomiting, bloating, diarrhea, constipation, hematemesis, melena, hematochezia  or hemorrhoids. Genitourinary: Denies dysuria, frequency, urgency, nocturia, hesitancy, discharge, hematuria or flank pain. Musculoskeletal: Denies arthralgias, myalgias, stiffness, jt. swelling, pain, limping or strain/sprain.  Skin: Denies pruritus, rash, hives, warts, acne, eczema or change in skin lesion(s). Neuro: No weakness, tremor, incoordination, spasms, paresthesia or pain. Psychiatric: Denies confusion, memory loss or sensory loss. Endo: Denies change in weight, skin or hair change.  Heme/Lymph: No excessive bleeding, bruising or enlarged lymph nodes.  Physical Exam  BP 122/76   Pulse (!) 52   Temp (!) 97.4 F (36.3 C)   Resp 16   Ht 5\' 7"  (1.702 m)   Wt 149 lb 12.8 oz (67.9 kg)   BMI  23.46 kg/m   Appears  well nourished, well groomed  and in no distress.  Eyes: PERRLA, EOMs, conjunctiva no swelling or erythema. Sinuses: No frontal/maxillary tenderness ENT/Mouth: EAC's clear, TM's nl w/o erythema, bulging. Nares clear w/o erythema, swelling, exudates. Oropharynx clear without erythema or exudates. Oral hygiene is good. Tongue normal, non obstructing. Hearing intact.  Neck: Supple. Thyroid not palpable. Car 2+/2+ without bruits, nodes or JVD. Chest: Respirations nl with BS clear & equal w/o rales, rhonchi, wheezing or stridor.  Cor: Heart sounds normal w/ regular rate and rhythm without sig. murmurs, gallops, clicks or rubs. Peripheral pulses normal and equal  without edema.  Abdomen: Soft & bowel sounds normal. Non-tender w/o guarding, rebound, hernias, masses or organomegaly.  Lymphatics: Unremarkable.  Musculoskeletal: Full ROM all peripheral extremities, joint stability, 5/5 strength and normal gait.  Skin: Warm, dry without exposed rashes, lesions or ecchymosis apparent.  Neuro: Cranial nerves intact, reflexes equal bilaterally. Sensory-motor testing grossly intact. Tendon reflexes grossly intact.  Pysch: Alert & oriented x 3.  Insight and judgement nl & appropriate. No ideations.  Assessment and Plan:  1. Essential hypertension  - Continue medication, monitor blood pressure at home.  - Continue DASH diet.  Reminder to go to the ER if any CP,  SOB, nausea, dizziness, severe HA, changes vision/speech.  - CBC with Differential/Platelet - COMPLETE METABOLIC PANEL WITH GFR - Magnesium - TSH   2. Hyperlipidemia, mixed  - Continue diet/meds, exercise,& lifestyle modifications.  - Continue monitor periodic cholesterol/liver & renal functions   - Lipid panel - TSH  3. Abnormal glucose  - Continue diet, exercise  - Lifestyle modifications.  - Monitor appropriate labs.  - Hemoglobin A1c - Insulin, random  4. Vitamin D deficiency  - Continue  supplementation.  - VITAMIN D 25 Hydroxy   5. Hyperuricemia  - Uric acid  6. Gastroesophageal reflux disease  - CBC with Differential/Platelet  7. Medication management  - CBC with Differential/Platelet - COMPLETE METABOLIC PANEL WITH GFR - Magnesium - Lipid panel - TSH - Hemoglobin A1c - Insulin, random - VITAMIN D 25 Hydroxy  - Uric acid        Discussed  regular exercise, BP monitoring, weight control to achieve/maintain BMI less than 25 and discussed med and SE's. Recommended labs to assess and monitor clinical status with further disposition pending results of labs.  I discussed the assessment and treatment plan with the patient. The patient was provided an opportunity to ask questions and all were answered. The patient agreed with the plan and demonstrated an understanding of the instructions.  I provided over 30 minutes of exam, counseling, chart review and  complex critical decision making.   Kirtland Bouchard, MD

## 2019-11-29 ENCOUNTER — Encounter: Payer: Self-pay | Admitting: Internal Medicine

## 2019-11-29 ENCOUNTER — Ambulatory Visit: Payer: No Typology Code available for payment source | Admitting: Internal Medicine

## 2019-11-29 ENCOUNTER — Other Ambulatory Visit: Payer: Self-pay

## 2019-11-29 VITALS — BP 122/76 | HR 52 | Temp 97.4°F | Resp 16 | Ht 67.0 in | Wt 149.8 lb

## 2019-11-29 DIAGNOSIS — K219 Gastro-esophageal reflux disease without esophagitis: Secondary | ICD-10-CM

## 2019-11-29 DIAGNOSIS — R7309 Other abnormal glucose: Secondary | ICD-10-CM | POA: Diagnosis not present

## 2019-11-29 DIAGNOSIS — E782 Mixed hyperlipidemia: Secondary | ICD-10-CM

## 2019-11-29 DIAGNOSIS — E559 Vitamin D deficiency, unspecified: Secondary | ICD-10-CM

## 2019-11-29 DIAGNOSIS — Z79899 Other long term (current) drug therapy: Secondary | ICD-10-CM

## 2019-11-29 DIAGNOSIS — E790 Hyperuricemia without signs of inflammatory arthritis and tophaceous disease: Secondary | ICD-10-CM

## 2019-11-29 DIAGNOSIS — I10 Essential (primary) hypertension: Secondary | ICD-10-CM | POA: Diagnosis not present

## 2019-11-30 LAB — LIPID PANEL
Cholesterol: 143 mg/dL (ref <200)
HDL: 66 mg/dL (ref >=50)
LDL Cholesterol (Calc): 56 mg/dL (calc)
Non-HDL Cholesterol (Calc): 77 mg/dL (calc) (ref <130)
Total CHOL/HDL Ratio: 2.2 (calc) (ref <5.0)
Triglycerides: 124 mg/dL (ref <150)

## 2019-11-30 LAB — CBC WITH DIFFERENTIAL/PLATELET
Absolute Monocytes: 650 cells/uL (ref 200–950)
Basophils Absolute: 89 cells/uL (ref 0–200)
Basophils Relative: 1 %
Eosinophils Absolute: 142 cells/uL (ref 15–500)
Eosinophils Relative: 1.6 %
HCT: 41.7 % (ref 35.0–45.0)
Hemoglobin: 14.3 g/dL (ref 11.7–15.5)
Lymphs Abs: 3329 cells/uL (ref 850–3900)
MCH: 33.5 pg — ABNORMAL HIGH (ref 27.0–33.0)
MCHC: 34.3 g/dL (ref 32.0–36.0)
MCV: 97.7 fL (ref 80.0–100.0)
MPV: 12.6 fL — ABNORMAL HIGH (ref 7.5–12.5)
Monocytes Relative: 7.3 %
Neutro Abs: 4690 cells/uL (ref 1500–7800)
Neutrophils Relative %: 52.7 %
Platelets: 305 10*3/uL (ref 140–400)
RBC: 4.27 10*6/uL (ref 3.80–5.10)
RDW: 12.2 % (ref 11.0–15.0)
Total Lymphocyte: 37.4 %
WBC: 8.9 10*3/uL (ref 3.8–10.8)

## 2019-11-30 LAB — COMPLETE METABOLIC PANEL WITH GFR
AG Ratio: 1.7 (calc) (ref 1.0–2.5)
ALT: 23 U/L (ref 6–29)
AST: 32 U/L (ref 10–35)
Albumin: 4.5 g/dL (ref 3.6–5.1)
Alkaline phosphatase (APISO): 112 U/L (ref 37–153)
BUN: 24 mg/dL (ref 7–25)
CO2: 29 mmol/L (ref 20–32)
Calcium: 10.4 mg/dL (ref 8.6–10.4)
Chloride: 97 mmol/L — ABNORMAL LOW (ref 98–110)
Creat: 0.99 mg/dL (ref 0.50–0.99)
GFR, Est African American: 69 mL/min/{1.73_m2} (ref >=60)
GFR, Est Non African American: 60 mL/min/{1.73_m2} (ref >=60)
Globulin: 2.7 g/dL (calc) (ref 1.9–3.7)
Glucose, Bld: 76 mg/dL (ref 65–99)
Potassium: 4.2 mmol/L (ref 3.5–5.3)
Sodium: 137 mmol/L (ref 135–146)
Total Bilirubin: 0.9 mg/dL (ref 0.2–1.2)
Total Protein: 7.2 g/dL (ref 6.1–8.1)

## 2019-11-30 LAB — MAGNESIUM: Magnesium: 2.3 mg/dL (ref 1.5–2.5)

## 2019-11-30 LAB — INSULIN, RANDOM: Insulin: 6.2 u[IU]/mL

## 2019-11-30 LAB — URIC ACID: Uric Acid, Serum: 3.2 mg/dL (ref 2.5–7.0)

## 2019-11-30 LAB — TSH: TSH: 1.87 mIU/L (ref 0.40–4.50)

## 2019-11-30 LAB — HEMOGLOBIN A1C
Hgb A1c MFr Bld: 4.8 % of total Hgb (ref <5.7)
Mean Plasma Glucose: 91 (calc)
eAG (mmol/L): 5 (calc)

## 2019-11-30 LAB — VITAMIN D 25 HYDROXY (VIT D DEFICIENCY, FRACTURES): Vit D, 25-Hydroxy: 75 ng/mL (ref 30–100)

## 2019-12-09 ENCOUNTER — Other Ambulatory Visit: Payer: Self-pay | Admitting: Obstetrics and Gynecology

## 2019-12-09 DIAGNOSIS — Z1231 Encounter for screening mammogram for malignant neoplasm of breast: Secondary | ICD-10-CM

## 2020-01-22 ENCOUNTER — Other Ambulatory Visit: Payer: Self-pay | Admitting: Internal Medicine

## 2020-01-25 ENCOUNTER — Telehealth: Payer: Self-pay | Admitting: *Deleted

## 2020-01-25 ENCOUNTER — Other Ambulatory Visit: Payer: Self-pay | Admitting: Internal Medicine

## 2020-01-25 DIAGNOSIS — N951 Menopausal and female climacteric states: Secondary | ICD-10-CM

## 2020-01-25 DIAGNOSIS — E2839 Other primary ovarian failure: Secondary | ICD-10-CM

## 2020-01-25 NOTE — Telephone Encounter (Signed)
Patient called and asked if she can increase her Bumex 2 mg to twice a day. Per Dr Melford Aase, she can take 1 tablet one day alternating with 2 tablets the next. Patient is aware.

## 2020-02-08 LAB — COLOGUARD: Cologuard: NEGATIVE

## 2020-03-05 ENCOUNTER — Ambulatory Visit: Payer: No Typology Code available for payment source

## 2020-03-16 ENCOUNTER — Other Ambulatory Visit: Payer: Self-pay

## 2020-03-16 ENCOUNTER — Ambulatory Visit
Admission: RE | Admit: 2020-03-16 | Discharge: 2020-03-16 | Disposition: A | Discharge status: Home / Self Care | Payer: No Typology Code available for payment source | Source: Ambulatory Visit | Attending: Obstetrics and Gynecology | Admitting: Obstetrics and Gynecology

## 2020-03-16 DIAGNOSIS — Z1231 Encounter for screening mammogram for malignant neoplasm of breast: Secondary | ICD-10-CM

## 2020-04-07 ENCOUNTER — Other Ambulatory Visit: Payer: Self-pay | Admitting: Internal Medicine

## 2020-04-26 ENCOUNTER — Ambulatory Visit
Admission: RE | Admit: 2020-04-26 | Discharge: 2020-04-26 | Disposition: A | Discharge status: Home / Self Care | Payer: No Typology Code available for payment source | Source: Ambulatory Visit | Attending: Internal Medicine | Admitting: Internal Medicine

## 2020-04-26 ENCOUNTER — Other Ambulatory Visit: Payer: Self-pay

## 2020-04-26 DIAGNOSIS — E2839 Other primary ovarian failure: Secondary | ICD-10-CM

## 2020-04-26 DIAGNOSIS — N951 Menopausal and female climacteric states: Secondary | ICD-10-CM

## 2020-04-26 NOTE — Progress Notes (Signed)
========================================================== ==========================================================  -    Your Bone Density T score of -1.6 shows that your have   - very mild Osteopenia or mild "thinning" of bone density which is   recommended that you take Calcium 500-600 mg of Calcium Daily &   Vitamin D and important to exercise regularly               And thank goodness you do not have the more serious worse Osteoporosis with T score more negative than -2.5 and therefore you do not need pills or shots to "harden" your bones.

## 2020-04-28 ENCOUNTER — Other Ambulatory Visit: Payer: Self-pay | Admitting: Internal Medicine

## 2020-06-05 ENCOUNTER — Encounter: Payer: No Typology Code available for payment source | Admitting: Internal Medicine

## 2020-06-07 ENCOUNTER — Encounter: Payer: Self-pay | Admitting: Adult Health Nurse Practitioner

## 2020-06-07 ENCOUNTER — Ambulatory Visit (INDEPENDENT_AMBULATORY_CARE_PROVIDER_SITE_OTHER): Payer: Medicare Other | Admitting: Adult Health Nurse Practitioner

## 2020-06-07 ENCOUNTER — Other Ambulatory Visit: Payer: Self-pay

## 2020-06-07 VITALS — BP 120/84 | HR 78 | Temp 97.1°F | Ht 67.0 in | Wt 148.0 lb

## 2020-06-07 DIAGNOSIS — Z23 Encounter for immunization: Secondary | ICD-10-CM

## 2020-06-07 DIAGNOSIS — I10 Essential (primary) hypertension: Secondary | ICD-10-CM

## 2020-06-07 DIAGNOSIS — N1831 Chronic kidney disease, stage 3a: Secondary | ICD-10-CM

## 2020-06-07 DIAGNOSIS — R600 Localized edema: Secondary | ICD-10-CM

## 2020-06-07 DIAGNOSIS — E2839 Other primary ovarian failure: Secondary | ICD-10-CM

## 2020-06-07 DIAGNOSIS — E790 Hyperuricemia without signs of inflammatory arthritis and tophaceous disease: Secondary | ICD-10-CM

## 2020-06-07 DIAGNOSIS — Z0000 Encounter for general adult medical examination without abnormal findings: Secondary | ICD-10-CM | POA: Diagnosis not present

## 2020-06-07 DIAGNOSIS — Z79899 Other long term (current) drug therapy: Secondary | ICD-10-CM

## 2020-06-07 DIAGNOSIS — Z136 Encounter for screening for cardiovascular disorders: Secondary | ICD-10-CM

## 2020-06-07 DIAGNOSIS — Z6823 Body mass index (BMI) 23.0-23.9, adult: Secondary | ICD-10-CM

## 2020-06-07 DIAGNOSIS — F419 Anxiety disorder, unspecified: Secondary | ICD-10-CM

## 2020-06-07 DIAGNOSIS — R300 Dysuria: Secondary | ICD-10-CM | POA: Diagnosis not present

## 2020-06-07 DIAGNOSIS — R7309 Other abnormal glucose: Secondary | ICD-10-CM

## 2020-06-07 DIAGNOSIS — E559 Vitamin D deficiency, unspecified: Secondary | ICD-10-CM

## 2020-06-07 DIAGNOSIS — K219 Gastro-esophageal reflux disease without esophagitis: Secondary | ICD-10-CM

## 2020-06-07 DIAGNOSIS — E782 Mixed hyperlipidemia: Secondary | ICD-10-CM | POA: Diagnosis not present

## 2020-06-07 NOTE — Progress Notes (Signed)
MEDICARE ANNUAL WELLNESS VISIT AND FOLLOW UP  Assessment:   Katie Cox was seen today for medicare wellness.  Diagnoses and all orders for this visit:  Welcome to Medicare preventive visit Yearly  Needs flu shot -     FLU VACCINE MDCK QUAD W/Preservative Received today  Essential hypertension Monitor blood pressure at home; call if consistently over 130/80 Continue DASH diet.   Reminder to go to the ER if any CP, SOB, nausea, dizziness, severe HA, changes vision/speech, left arm numbness and tingling and jaw pain. -     CBC with Differential/Platelet -     COMPLETE METABOLIC PANEL WITH GFR -     TSH  Hyperlipidemia, mixed Continue medications: rosuvastatin 20mg  Discussed dietary and exercise modifications Low fat diet -     Lipid panel  Abnormal glucose Discussed dietary and exercise modifications -     Hemoglobin A1c  Vitamin D deficiency Continue supplementation to maintain goal of 70-100  Gastroesophageal reflux disease, unspecified whether esophagitis present Doing well at this time Continue: famotidine 40mg  Diet discussed Monitor for triggers Avoid food with high acid content Avoid excessive cafeine Increase water intake  BMI 23.0-23.9, adult Discussed dietary and exercise modifications  Estrogen deficiency MGM & DEXA  Anxiety Doing well at this time Continue to monitor  Medication management Continued  Hyperuricemia Continue allopurinol 300mg  daily No recent flares Discussed dietary modifications Continue to monitor   Dysuria -     Urinalysis w microscopic + reflex cultur -     Microalbumin / creatinine urine ratio  Stage 3a chronic kidney disease (HCC) Increase fluids  Avoid NSAIDS Blood pressure control Monitor sugars  Will continue to monitor  Edema bilateral lower extremities Has Bumex 2mg  PRN to take potassium wit this 59meq. Take one tablet 1-02 times a day as needed.  Over 40 minutes of face to face interview, exam,  counseling, chart review and critical decision making was performed Future Appointments  Date Time Provider [place]  12/10/2020  3:00 PM Garnet Sierras, NP [facility] None  06/12/2021  2:30 PM Antoine Vandermeulen, Danton Sewer, NP [facility] None     Plan:   During the course of the visit the patient was educated and counseled about appropriate screening and preventive services including:    Pneumococcal vaccine   Prevnar 13  Influenza vaccine  Td vaccine  Screening electrocardiogram  Bone densitometry screening  Colorectal cancer screening  Diabetes screening  Glaucoma screening  Nutrition counseling   Advanced directives: requested   Subjective:  Katie Cox is a 64 y.o. female who presents for Medicare Annual Wellness Visit and 3 month follow up.   Reports that she is having stomach bloating and gurgling.  She report she typically.  She report sit is the same rather she eats healthy or unhealthy choices.   She is going to the bathroom 1-2 times a day.  She reports that she does have a lot of stress that she deals with.  She reports she lives alone and that is stressful and she is still working at this time. Diverticulosis     She has had elevated blood pressure for  years. Her blood pressure has been controlled at home, today their BP is BP: 120/84 She does not workout. She denies chest pain, shortness of breath, dizziness.  She is not on cholesterol medication and denies myalgias. Her cholesterol is not at goal. The cholesterol last visit was:   Lab Results  Component Value Date   CHOL 134 06/07/2020   HDL  59 06/07/2020   LDLCALC 54 06/07/2020   TRIG 129 06/07/2020   CHOLHDL 2.3 06/07/2020    She has not been working on diet and exercise for abnormal glucose/prediabetes, and denies polydipsia, polyuria, visual disturbances, vomiting and weight loss. Last A1C in the office was:  Lab Results  Component Value Date   HGBA1C 5.3 06/07/2020   Last GFR:   Lab  Results  Component Value Date   GFRNONAA 49 (L) 06/07/2020   Lab Results  Component Value Date   GFRAA 33 (L) 06/07/2020   Patient is on Vitamin D supplement.   Lab Results  Component Value Date   VD25OH 75 11/29/2019      Medication Review: Current Outpatient Medications on File Prior to Visit  Medication Sig Dispense Refill  . allopurinol (ZYLOPRIM) 300 MG tablet Take      1 tablet       Daily        for Prevent Gout 90 tablet 0  . Ascorbic Acid (VITAMIN C WITH ROSE HIPS) 1000 MG tablet Take 1,000 mg by mouth daily.    Marland Kitchen aspirin 81 MG tablet Take 81 mg by mouth every other day.     . bumetanide (BUMEX) 2 MG tablet Take 1 tablet 1 to 2 x /day ONLY if Ankles Swollen to Avoid Kidney Damage from Dehydration 180 tablet 3  . famotidine (PEPCID) 40 MG tablet Take 1 tablet at bedtime for Acid Reflux 90 tablet 3  . Magnesium 250 MG TABS Take 2 tablets by mouth 2 (two) times daily.    . Multiple Vitamin (MULTIVITAMIN) tablet Take 1 tablet by mouth daily.    Marland Kitchen OVER THE COUNTER MEDICATION Takes OTC Allegra for allergies.    . potassium chloride SA (KLOR-CON) 20 MEQ tablet Take     1 tablet       2 x /day      for Potassium 180 tablet 0  . rosuvastatin (CRESTOR) 20 MG tablet Take      1 tablet       Daily       for Cholesterol 90 tablet 0  . fluticasone (FLONASE) 50 MCG/ACT nasal spray Place 1 spray into both nostrils daily.     No current facility-administered medications on file prior to visit.    Allergies  Allergen Reactions  . Alprazolam Other (See Comments)     Unknown reaction per pt  . Prednisone     Only high doses    Current Problems (verified) Patient Active Problem List   Diagnosis Date Noted  . Anxiety 08/23/2019  . FHx: heart disease 04/21/2018  . Prediabetes 04/21/2018  . Abnormal glucose 02/05/2015  . Hyperuricemia 08/13/2014  . Encounter for general adult medical examination with abnormal findings 12/26/2013  . GERD (gastroesophageal reflux disease)   .  Hyperlipidemia, mixed   . Essential hypertension   . Vitamin D deficiency   . Peripheral neuropathy   . POLYP, COLON 10/18/2007  . Irritable bowel syndrome 10/18/2007    Screening Tests Immunization History  Administered Date(s) Administered  . Influenza Inj Mdck Quad With Preservative 04/21/2018, 05/18/2019, 06/07/2020  . PFIZER SARS-COV-2 Vaccination 09/19/2019, 10/12/2019  . PPD Test 01/25/2014, 02/05/2015, 02/27/2016, 04/21/2018, 05/18/2019  . Pneumococcal Polysaccharide-23 11/23/2008  . Tdap 11/29/2009    Preventative care: Last colonoscopy: 3/21 Last mammogram: 9/21 Last pap smear/pelvic exam: 10/2014   DEXA:04/2020 (-1.6) Osteopenic Hep C: 2015: Neg  Prior vaccinations: TD or Tdap: 11/2009  Influenza: 05/2020 HD  Pneumococcal: 11/2008, Due  2022 Prevnar13: DUE Shingles/Zostavax: Discussed with patient  Names of Other Physician/Practitioners you currently use: 1. [facility] here for primary care 2. Eye Exam 2021 3. Dental Exam 2021 Patient Care Team: Unk Pinto, MD as PCP - General (Internal Medicine)  SURGICAL HISTORY She  has a past surgical history that includes goiter removed fro thyroid; Foot surgery; Colonoscopy; Cholecystectomy (04/14/2012); Open reduction internal fixation (orif) distal radial fracture (Right, 07/04/2015); carpel tunnel surgery (Right); Wisdom tooth extraction; Eye surgery; and Polypectomy. FAMILY HISTORY Her family history includes Breast cancer in her maternal aunt; Colon cancer (age of onset: 22) in her father; Colon polyps in her father; Heart disease in her mother. SOCIAL HISTORY She  reports that she has never smoked. She has never used smokeless tobacco. She reports that she does not drink alcohol and does not use drugs.   MEDICARE WELLNESS OBJECTIVES: Physical activity: Current Exercise Habits: The patient does not participate in regular exercise at present, Exercise limited by: None  identified Cardiac risk factors: Cardiac Risk Factors include: none;advanced age (>40men, >66 women);hypertension;dyslipidemia;sedentary lifestyle Depression/mood screen:   Depression screen [facility] 2/9 06/07/2020  Decreased Interest 0  Down, Depressed, Hopeless 0  PHQ - 2 Score 0    ADLs:  In your present state of health, do you have any difficulty performing the following activities: 06/07/2020  Hearing? N  Vision? N  Difficulty concentrating or making decisions? N  Walking or climbing stairs? N  Dressing or bathing? N  Doing errands, shopping? N  Preparing Food and eating ? N  Using the Toilet? N  In the past six months, have you accidently leaked urine? N  Do you have problems with loss of bowel control? N  Managing your Medications? N  Managing your Finances? N  Housekeeping or managing your Housekeeping? N  Some recent data might be hidden     Cognitive Testing  Alert? Yes  Normal Appearance?Yes  Oriented to person? Yes  Place? Yes   Time? Yes  Recall of three objects?  Yes  Can perform simple calculations? Yes  Displays appropriate judgment?Yes  Can read the correct time from a watch face?Yes  EOL planning: Does Patient Have a Medical Advance Directive?: No Would patient like information on creating a medical advance directive?: Yes (MAU/Ambulatory/Procedural Areas - Information given)  Review of Systems  Constitutional: Negative for chills, diaphoresis, fever, malaise/fatigue and weight loss.  HENT: Negative for congestion, ear discharge, ear pain, hearing loss, nosebleeds, sinus pain, sore throat and tinnitus.   Eyes: Negative for blurred vision, double vision, photophobia, pain, discharge and redness.  Respiratory: Negative for cough, hemoptysis, sputum production, shortness of breath, wheezing and stridor.   Cardiovascular: Negative for chest pain, palpitations, orthopnea, claudication, leg swelling and PND.  Gastrointestinal: Negative for abdominal pain, blood in  stool, constipation, diarrhea, heartburn, melena, nausea and vomiting.  Genitourinary: Negative for dysuria, flank pain, frequency, hematuria and urgency.  Musculoskeletal: Negative for back pain, falls, joint pain, myalgias and neck pain.  Skin: Negative for itching and rash.  Neurological: Negative for dizziness, tingling, tremors, sensory change, speech change, focal weakness, seizures, loss of consciousness, weakness and headaches.  Endo/Heme/Allergies: Negative for environmental allergies and polydipsia. Does not bruise/bleed easily.  Psychiatric/Behavioral: Negative for depression, hallucinations, memory loss, substance abuse and suicidal ideas. The patient is not nervous/anxious and does not have insomnia.      Objective:     Today's Vitals   06/07/20 1526  BP: 120/84  Pulse: 78  Temp: Marland Kitchen)  97.1 F (36.2 C)  SpO2: 98%  Weight: 148 lb (67.1 kg)  Height: 5\' 7"  (1.702 m)   Body mass index is 23.18 kg/m.  General appearance: alert, no distress, WD/WN, female HEENT: normocephalic, sclerae anicteric, TMs pearly, nares patent, no discharge or erythema, pharynx normal Oral cavity: MMM, no lesions Neck: supple, no lymphadenopathy, no thyromegaly, no masses Heart: RRR, normal S1, S2, no murmurs Lungs: CTA bilaterally, no wheezes, rhonchi, or rales Abdomen: +bs, soft, non tender, non distended, no masses, no hepatomegaly, no splenomegaly Musculoskeletal: nontender, no swelling, no obvious deformity Extremities: no edema, no cyanosis, no clubbing Pulses: 2+ symmetric, upper and lower extremities, normal cap refill Neurological: alert, oriented x 3, CN2-12 intact, strength normal upper extremities and lower extremities, sensation normal throughout, DTRs 2+ throughout, no cerebellar signs, gait normal Psychiatric: normal affect, behavior normal, pleasant   Medicare Attestation I have personally reviewed: The patient's medical and social history Their use of alcohol, tobacco or  illicit drugs Their current medications and supplements The patient's functional ability including ADLs,fall risks, home safety risks, cognitive, and hearing and visual impairment Diet and physical activities Evidence for depression or mood disorders  The patient's weight, height, BMI, and visual acuity have been recorded in the chart.  I have made referrals, counseling, and provided education to the patient based on review of the above and I have provided the patient with a written personalized care plan for preventive services.      Garnet Sierras, Laqueta Jean, DNP [facility] 06/07/2020  3:51 PM

## 2020-06-07 NOTE — Patient Instructions (Addendum)
  You received the Influenza vaccination today, high dose.   You are due for Pneumonia vaccination  You are also do for Shingles vaccination.  Check with your local pharmacy about insurance coverage.  There is som information below     Katie Cox , Thank you for taking time to come for your Medicare Wellness Visit. I appreciate your ongoing commitment to your health goals. Please review the following plan we discussed and let me know if I can assist you in the future.   These are the goals we discussed: Goals   64-80 ounces of water a day.   Increase activity, even a 10-91min walk can improve mental & physical health    This is a list of the screening recommended for you and due dates:  Health Maintenance  Topic Date Due  . Pneumonia vaccines (1 of 2 - PCV13) 11/14/2019  . Tetanus Vaccine  11/30/2019  . COVID-19 Vaccine (3 - Booster for Pfizer series) 04/12/2020  . Mammogram  03/16/2021  . Pap Smear  03/18/2021  . Colon Cancer Screening  08/31/2024  . Flu Shot  Completed  . DEXA scan (bone density measurement)  Completed  .  Hepatitis C: One time screening is recommended by Center for Disease Control  (CDC) for  adults born from [birth year] through 1966.   Completed  . HIV Screening  Completed    Try Metamucil every day in the morning.  This will help to bulk up your bowel movements.  This is not a stimulant and should help with diarrhea.   GENERAL HEALTH GOALS  Know what a healthy weight is for you (roughly BMI <25) and aim to maintain this  Aim for 7+ servings of fruits and vegetables daily  70-80+ fluid ounces of water or unsweet tea for healthy kidneys  Limit to max 1 drink of alcohol per day; avoid smoking/tobacco  Limit animal fats in diet for cholesterol and heart health - choose grass fed whenever available  Avoid highly processed foods, and foods high in saturated/trans fats  Aim for low stress - take time to unwind and care for your mental health  Aim for 150 min  of moderate intensity exercise weekly for heart health, and weights twice weekly for bone health  Aim for 7-9 hours of sleep daily

## 2020-06-08 LAB — COMPLETE METABOLIC PANEL WITH GFR
AG Ratio: 1.8 (calc) (ref 1.0–2.5)
ALT: 21 U/L (ref 6–29)
AST: 29 U/L (ref 10–35)
Albumin: 4.4 g/dL (ref 3.6–5.1)
Alkaline phosphatase (APISO): 73 U/L (ref 37–153)
BUN/Creatinine Ratio: 22 (calc) (ref 6–22)
BUN: 26 mg/dL — ABNORMAL HIGH (ref 7–25)
CO2: 31 mmol/L (ref 20–32)
Calcium: 10.2 mg/dL (ref 8.6–10.4)
Chloride: 100 mmol/L (ref 98–110)
Creat: 1.16 mg/dL — ABNORMAL HIGH (ref 0.50–0.99)
GFR, Est African American: 57 mL/min/{1.73_m2} — ABNORMAL LOW (ref >=60)
GFR, Est Non African American: 49 mL/min/{1.73_m2} — ABNORMAL LOW (ref >=60)
Globulin: 2.5 g/dL (calc) (ref 1.9–3.7)
Glucose, Bld: 78 mg/dL (ref 65–99)
Potassium: 4.2 mmol/L (ref 3.5–5.3)
Sodium: 141 mmol/L (ref 135–146)
Total Bilirubin: 1.1 mg/dL (ref 0.2–1.2)
Total Protein: 6.9 g/dL (ref 6.1–8.1)

## 2020-06-08 LAB — CBC WITH DIFFERENTIAL/PLATELET
Absolute Monocytes: 602 cells/uL (ref 200–950)
Basophils Absolute: 77 cells/uL (ref 0–200)
Basophils Relative: 0.9 %
Eosinophils Absolute: 129 cells/uL (ref 15–500)
Eosinophils Relative: 1.5 %
HCT: 39.6 % (ref 35.0–45.0)
Hemoglobin: 13.8 g/dL (ref 11.7–15.5)
Lymphs Abs: 3595 cells/uL (ref 850–3900)
MCH: 32.9 pg (ref 27.0–33.0)
MCHC: 34.8 g/dL (ref 32.0–36.0)
MCV: 94.3 fL (ref 80.0–100.0)
MPV: 13 fL — ABNORMAL HIGH (ref 7.5–12.5)
Monocytes Relative: 7 %
Neutro Abs: 4197 cells/uL (ref 1500–7800)
Neutrophils Relative %: 48.8 %
Platelets: 301 10*3/uL (ref 140–400)
RBC: 4.2 10*6/uL (ref 3.80–5.10)
RDW: 12.1 % (ref 11.0–15.0)
Total Lymphocyte: 41.8 %
WBC: 8.6 10*3/uL (ref 3.8–10.8)

## 2020-06-08 LAB — URINALYSIS W MICROSCOPIC + REFLEX CULTURE
Bilirubin Urine: NEGATIVE
Glucose, UA: NEGATIVE
Hgb urine dipstick: NEGATIVE
Hyaline Cast: NONE SEEN /LPF
Ketones, ur: NEGATIVE
Leukocyte Esterase: NEGATIVE
Nitrites, Initial: NEGATIVE
Protein, ur: NEGATIVE
RBC / HPF: NONE SEEN /HPF (ref 0–2)
Specific Gravity, Urine: 1.007 (ref 1.001–1.03)
Squamous Epithelial / HPF: NONE SEEN /HPF (ref <=5)
pH: 8 (ref 5.0–8.0)

## 2020-06-08 LAB — LIPID PANEL
Cholesterol: 134 mg/dL (ref <200)
HDL: 59 mg/dL (ref >=50)
LDL Cholesterol (Calc): 54 mg/dL (calc)
Non-HDL Cholesterol (Calc): 75 mg/dL (calc) (ref <130)
Total CHOL/HDL Ratio: 2.3 (calc) (ref <5.0)
Triglycerides: 129 mg/dL (ref <150)

## 2020-06-08 LAB — MICROALBUMIN / CREATININE URINE RATIO
Creatinine, Urine: 28 mg/dL (ref 20–275)
Microalb Creat Ratio: 25 mcg/mg creat (ref <30)
Microalb, Ur: 0.7 mg/dL

## 2020-06-08 LAB — HEMOGLOBIN A1C
Hgb A1c MFr Bld: 5.3 % of total Hgb (ref <5.7)
Mean Plasma Glucose: 105 mg/dL
eAG (mmol/L): 5.8 mmol/L

## 2020-06-08 LAB — NO CULTURE INDICATED

## 2020-06-08 LAB — TSH: TSH: 1.81 mIU/L (ref 0.40–4.50)

## 2020-06-14 ENCOUNTER — Encounter: Payer: No Typology Code available for payment source | Admitting: Internal Medicine

## 2020-06-24 ENCOUNTER — Other Ambulatory Visit: Payer: Self-pay | Admitting: Internal Medicine

## 2020-06-24 DIAGNOSIS — E782 Mixed hyperlipidemia: Secondary | ICD-10-CM

## 2020-06-24 DIAGNOSIS — K219 Gastro-esophageal reflux disease without esophagitis: Secondary | ICD-10-CM

## 2020-06-24 DIAGNOSIS — I10 Essential (primary) hypertension: Secondary | ICD-10-CM

## 2020-06-24 DIAGNOSIS — E790 Hyperuricemia without signs of inflammatory arthritis and tophaceous disease: Secondary | ICD-10-CM

## 2020-06-24 MED ORDER — POTASSIUM CHLORIDE CRYS ER 20 MEQ PO TBCR
EXTENDED_RELEASE_TABLET | ORAL | 0 refills | Status: DC
Start: 2020-06-24 — End: 2020-06-25

## 2020-06-24 MED ORDER — BUMETANIDE 2 MG PO TABS: ORAL_TABLET | ORAL | Status: DC

## 2020-06-24 MED ORDER — ALLOPURINOL 300 MG PO TABS: ORAL_TABLET | ORAL | 0 refills | Status: DC

## 2020-06-24 MED ORDER — ROSUVASTATIN CALCIUM 20 MG PO TABS: ORAL_TABLET | ORAL | 0 refills | Status: DC

## 2020-06-24 MED ORDER — FAMOTIDINE 40 MG PO TABS: ORAL_TABLET | ORAL | 0 refills | Status: DC

## 2020-06-24 MED ORDER — POTASSIUM CHLORIDE CRYS ER 20 MEQ PO TBCR: EXTENDED_RELEASE_TABLET | ORAL | 0 refills | Status: DC

## 2020-06-24 MED ORDER — BUMETANIDE 2 MG PO TABS
ORAL_TABLET | ORAL | 0 refills | Status: DC
Start: 2020-06-24 — End: 2020-06-25

## 2020-06-24 MED ORDER — FAMOTIDINE 40 MG PO TABS
ORAL_TABLET | ORAL | 0 refills | Status: DC
Start: 2020-06-24 — End: 2020-06-24

## 2020-06-25 ENCOUNTER — Other Ambulatory Visit: Payer: Self-pay | Admitting: *Deleted

## 2020-06-25 ENCOUNTER — Other Ambulatory Visit: Payer: Self-pay | Admitting: Internal Medicine

## 2020-06-25 DIAGNOSIS — E782 Mixed hyperlipidemia: Secondary | ICD-10-CM

## 2020-06-25 DIAGNOSIS — I10 Essential (primary) hypertension: Secondary | ICD-10-CM

## 2020-06-25 DIAGNOSIS — E790 Hyperuricemia without signs of inflammatory arthritis and tophaceous disease: Secondary | ICD-10-CM

## 2020-06-25 DIAGNOSIS — K219 Gastro-esophageal reflux disease without esophagitis: Secondary | ICD-10-CM

## 2020-06-25 MED ORDER — BUMETANIDE 2 MG PO TABS: ORAL_TABLET | ORAL | 0 refills | Status: DC

## 2020-06-25 MED ORDER — POTASSIUM CHLORIDE CRYS ER 20 MEQ PO TBCR: EXTENDED_RELEASE_TABLET | ORAL | 0 refills | Status: DC

## 2020-06-25 MED ORDER — ROSUVASTATIN CALCIUM 20 MG PO TABS: ORAL_TABLET | ORAL | 0 refills | Status: DC

## 2020-06-25 MED ORDER — ALLOPURINOL 300 MG PO TABS: ORAL_TABLET | ORAL | 0 refills | Status: DC

## 2020-06-25 MED ORDER — FAMOTIDINE 40 MG PO TABS
ORAL_TABLET | ORAL | 0 refills | Status: DC
Start: 2020-06-25 — End: 2020-08-25

## 2020-06-26 NOTE — Addendum Note (Signed)
Addended byElder Negus A on: 06/26/2020 12:32 PM   Modules accepted: Orders

## 2020-07-16 ENCOUNTER — Other Ambulatory Visit: Payer: Self-pay | Admitting: Internal Medicine

## 2020-07-16 MED ORDER — BENZONATATE 200 MG PO CAPS: ORAL_CAPSULE | ORAL | 1 refills | Status: DC

## 2020-07-16 MED ORDER — PSEUDOEPHEDRINE HCL ER 120 MG PO TB12: ORAL_TABLET | ORAL | 0 refills | Status: DC

## 2020-07-16 MED ORDER — DEXAMETHASONE 4 MG PO TABS: ORAL_TABLET | ORAL | 0 refills | Status: DC

## 2020-08-13 ENCOUNTER — Other Ambulatory Visit: Payer: Self-pay

## 2020-08-13 ENCOUNTER — Ambulatory Visit (INDEPENDENT_AMBULATORY_CARE_PROVIDER_SITE_OTHER): Payer: Medicare Other

## 2020-08-13 VITALS — BP 120/80 | HR 74 | Temp 97.6°F | Ht 67.0 in | Wt 154.0 lb

## 2020-08-13 DIAGNOSIS — Z23 Encounter for immunization: Secondary | ICD-10-CM

## 2020-08-13 DIAGNOSIS — L539 Erythematous condition, unspecified: Secondary | ICD-10-CM

## 2020-08-13 NOTE — Progress Notes (Signed)
PATIENT reports for PCV 13 vaccine Given in left arm  patient does report DRY/RED skin on mostly on feet. She thinks it maybe due to the cold weather. Has been using corticosteroid cream OTC. That has helped some but wanted to make you aware & if you had any suggestion then that would be helpfully as well.---2wks now

## 2020-08-25 ENCOUNTER — Other Ambulatory Visit: Payer: Self-pay | Admitting: Internal Medicine

## 2020-08-25 DIAGNOSIS — I10 Essential (primary) hypertension: Secondary | ICD-10-CM

## 2020-08-25 DIAGNOSIS — E782 Mixed hyperlipidemia: Secondary | ICD-10-CM

## 2020-08-25 DIAGNOSIS — K219 Gastro-esophageal reflux disease without esophagitis: Secondary | ICD-10-CM

## 2020-08-26 ENCOUNTER — Other Ambulatory Visit: Payer: Self-pay | Admitting: Internal Medicine

## 2020-08-26 DIAGNOSIS — E790 Hyperuricemia without signs of inflammatory arthritis and tophaceous disease: Secondary | ICD-10-CM

## 2020-08-28 ENCOUNTER — Other Ambulatory Visit: Payer: Self-pay | Admitting: Internal Medicine

## 2020-08-28 DIAGNOSIS — I10 Essential (primary) hypertension: Secondary | ICD-10-CM

## 2020-09-20 ENCOUNTER — Telehealth: Payer: Self-pay

## 2020-09-20 NOTE — Telephone Encounter (Signed)
-----   Message from Garnet Sierras, NP sent at 09/20/2020 12:51 PM EDT ----- Regarding: RE: [place]: [phone] After having COVID conservatively you could wait three months before getting the shingles vaccine.  As far as COVID booster, if you just had this you will have natural antibodies.   Sincerely,           Garnet Sierras, NP   ----- Message ----- From: Elenor Quinones, CMA Sent: 09/19/2020  11:22 AM EDT To: Garnet Sierras, NP Subject: VACCINE QUESTION                               Patient would like to know how long after having COVID does she need to wait before getting the SHINGLES & COVID booster vaccine?    Please advice

## 2020-09-20 NOTE — Telephone Encounter (Signed)
Unable to reach patient at this time 

## 2020-10-02 DIAGNOSIS — H43813 Vitreous degeneration, bilateral: Secondary | ICD-10-CM | POA: Diagnosis not present

## 2020-10-02 DIAGNOSIS — Z83518 Family history of other specified eye disorder: Secondary | ICD-10-CM | POA: Diagnosis not present

## 2020-10-02 DIAGNOSIS — H2513 Age-related nuclear cataract, bilateral: Secondary | ICD-10-CM | POA: Diagnosis not present

## 2020-10-08 ENCOUNTER — Other Ambulatory Visit: Payer: Self-pay | Admitting: Internal Medicine

## 2020-10-08 DIAGNOSIS — I10 Essential (primary) hypertension: Secondary | ICD-10-CM

## 2020-10-08 MED ORDER — POTASSIUM CHLORIDE CRYS ER 20 MEQ PO TBCR: EXTENDED_RELEASE_TABLET | ORAL | 3 refills | Status: DC

## 2020-10-25 ENCOUNTER — Telehealth: Payer: Self-pay | Admitting: *Deleted

## 2020-10-25 NOTE — Telephone Encounter (Signed)
Patient called and asked if can take collagen to help with hair loss. Per Dr Melford Aase, he does not feel collagen would be helpful, since it is digested through the GI track. Dr Melford Aase suggested she continue Zinc 50 mg and add OTC iron, 1 tablet daily.

## 2020-10-29 DIAGNOSIS — M25561 Pain in right knee: Secondary | ICD-10-CM | POA: Diagnosis not present

## 2020-10-29 DIAGNOSIS — M1711 Unilateral primary osteoarthritis, right knee: Secondary | ICD-10-CM | POA: Diagnosis not present

## 2020-10-29 DIAGNOSIS — M25562 Pain in left knee: Secondary | ICD-10-CM | POA: Diagnosis not present

## 2020-11-08 DIAGNOSIS — S8002XA Contusion of left knee, initial encounter: Secondary | ICD-10-CM | POA: Diagnosis not present

## 2020-11-29 ENCOUNTER — Encounter: Payer: Self-pay | Admitting: Adult Health

## 2020-11-29 ENCOUNTER — Ambulatory Visit (INDEPENDENT_AMBULATORY_CARE_PROVIDER_SITE_OTHER): Payer: Medicare Other | Admitting: Adult Health

## 2020-11-29 ENCOUNTER — Other Ambulatory Visit: Payer: Self-pay

## 2020-11-29 VITALS — BP 116/72 | HR 59 | Temp 96.4°F | Ht 66.5 in | Wt 152.8 lb

## 2020-11-29 DIAGNOSIS — N1831 Chronic kidney disease, stage 3a: Secondary | ICD-10-CM

## 2020-11-29 DIAGNOSIS — R7309 Other abnormal glucose: Secondary | ICD-10-CM

## 2020-11-29 DIAGNOSIS — E559 Vitamin D deficiency, unspecified: Secondary | ICD-10-CM | POA: Diagnosis not present

## 2020-11-29 DIAGNOSIS — I10 Essential (primary) hypertension: Secondary | ICD-10-CM

## 2020-11-29 DIAGNOSIS — Z0000 Encounter for general adult medical examination without abnormal findings: Secondary | ICD-10-CM | POA: Diagnosis not present

## 2020-11-29 DIAGNOSIS — R7303 Prediabetes: Secondary | ICD-10-CM

## 2020-11-29 DIAGNOSIS — K219 Gastro-esophageal reflux disease without esophagitis: Secondary | ICD-10-CM

## 2020-11-29 DIAGNOSIS — F419 Anxiety disorder, unspecified: Secondary | ICD-10-CM

## 2020-11-29 DIAGNOSIS — E782 Mixed hyperlipidemia: Secondary | ICD-10-CM

## 2020-11-29 DIAGNOSIS — Z1389 Encounter for screening for other disorder: Secondary | ICD-10-CM

## 2020-11-29 DIAGNOSIS — D649 Anemia, unspecified: Secondary | ICD-10-CM

## 2020-11-29 DIAGNOSIS — Z136 Encounter for screening for cardiovascular disorders: Secondary | ICD-10-CM

## 2020-11-29 DIAGNOSIS — Z23 Encounter for immunization: Secondary | ICD-10-CM

## 2020-11-29 DIAGNOSIS — Z8249 Family history of ischemic heart disease and other diseases of the circulatory system: Secondary | ICD-10-CM | POA: Diagnosis not present

## 2020-11-29 DIAGNOSIS — Z1329 Encounter for screening for other suspected endocrine disorder: Secondary | ICD-10-CM

## 2020-11-29 DIAGNOSIS — N183 Chronic kidney disease, stage 3 unspecified: Secondary | ICD-10-CM | POA: Insufficient documentation

## 2020-11-29 DIAGNOSIS — E790 Hyperuricemia without signs of inflammatory arthritis and tophaceous disease: Secondary | ICD-10-CM

## 2020-11-29 DIAGNOSIS — Z131 Encounter for screening for diabetes mellitus: Secondary | ICD-10-CM

## 2020-11-29 DIAGNOSIS — M8580 Other specified disorders of bone density and structure, unspecified site: Secondary | ICD-10-CM | POA: Insufficient documentation

## 2020-11-29 DIAGNOSIS — K582 Mixed irritable bowel syndrome: Secondary | ICD-10-CM

## 2020-11-29 DIAGNOSIS — G6289 Other specified polyneuropathies: Secondary | ICD-10-CM

## 2020-11-29 DIAGNOSIS — Z86010 Personal history of colonic polyps: Secondary | ICD-10-CM

## 2020-11-29 MED ORDER — ESCITALOPRAM OXALATE 10 MG PO TABS: ORAL_TABLET | ORAL | 3 refills | Status: DC

## 2020-11-29 MED ORDER — FUROSEMIDE 20 MG PO TABS: ORAL_TABLET | ORAL | 1 refills | Status: DC

## 2020-11-29 NOTE — Patient Instructions (Signed)
    Pick up "fiber fueled" by Dr. Harrold Donath  Also the cook book is excellent          Consider keeping a food diary- common causes of diarrhea are dairy, certain carbs...   FODMAP stands for fermentable oligo-, di-, mono-saccharides and polyols (1). These are the scientific terms used to classify groups of carbs that are notorious for triggering digestive symptoms like bloating, gas and stomach pain.   FODMAPs are found in a wide range of foods in varying amounts. Some foods contain just one type, while others contain several.  The main dietary sources of the four groups of FODMAPs include:  Oligosaccharides: Wheat, rye, legumes and various fruits and vegetables, such as garlic and onions.  Disaccharides: Milk, yogurt and soft cheese. Lactose is the main carb.  Monosaccharides: Various fruit including figs and mangoes, and sweeteners such as honey and agave nectar. Fructose is the main carb.  Polyols: Certain fruits and vegetables including blackberries and lychee, as well as some low-calorie sweeteners like those in sugar-free gum.   Keep a food diary. This will help you identify foods that cause symptoms. Write down: What you eat and when. What symptoms you have. When symptoms occur in relation to your meals. Avoid foods that cause symptoms. Talk with your dietitian about other ways to get the same nutrients that are in these foods. Eat your meals slowly, in a relaxed setting. Aim to eat 5-6 small meals per day. Do not skip meals. Drink enough fluids to keep your urine clear or pale yellow. Ask your health care provider if you should take an over-the-counter probiotic during flare-ups to help restore healthy gut bacteria. If you have cramping or diarrhea, try making your meals low in fat and high in carbohydrates. Examples of carbohydrates are pasta, rice, whole grain breads and cereals, fruits, and vegetables. If dairy products cause your symptoms to flare up, try eating  less of them. You might be able to handle yogurt better than other dairy products because it contains bacteria that help with digestion.

## 2020-11-29 NOTE — Progress Notes (Signed)
CPE  Assessment:    Encounter for Annual Physical Exam with abnormal findings Due annually   Essential hypertension Monitor blood pressure at home; call if consistently over 130/80 Continue DASH diet.   Reminder to go to the ER if any CP, SOB, nausea, dizziness, severe HA, changes vision/speech, left arm numbness and tingling and jaw pain. -     CBC with Differential/Platelet -     COMPLETE METABOLIC PANEL WITH GFR -     TSH -      UA/Microalbumin -      EKG  Hyperlipidemia, mixed Continue medications: rosuvastatin 20mg  Discussed dietary and exercise modifications Low fat diet -     Lipid panel -     TSH  Abnormal glucose Discussed dietary and exercise modifications -     Hemoglobin A1c  Vitamin D deficiency Continue supplementation to maintain goal of 70-100 -     Vitamin D  Gastroesophageal reflux disease, unspecified whether esophagitis present Well managed on current medications Discussed diet, avoiding triggers and other lifestyle changes       -      Magnesium   BMI 24 Discussed dietary and exercise modifications  Anxiety Start new medication as prescribed - lexapro 10 mg daily Stress management techniques discussed, increase water, good sleep hygiene discussed, increase exercise, and increase veggies.  Follow up 2-3 month, call the office if any new AE's from medications and we will switch them  Medication management Continued  Hyperuricemia Continue allopurinol 300mg  daily No recent flares Discussed dietary modifications - Uric acid   Osteopenia - repeat dexa 2 yrs, continue Vit D and Ca, weight bearing exercises  CKD 3 (HCC) Increase fluids, avoid NSAIDS, monitor sugars, will monitor - CMP/GFR - UA/microalbumin   IBS-D If not on benefiber then add it, decrease stress,   FODMAP reviewed; try tracking and elimination  "Fiber fueled" by Dr. Anabel Bene suggested if any worsening symptoms, blood in stool, AB pain, etc call  office   Orders Placed This Encounter  Procedures   CBC with Differential/Platelet   COMPLETE METABOLIC PANEL WITH GFR   Magnesium   Lipid panel   TSH   Hemoglobin A1c   VITAMIN D 25 Hydroxy (Vit-D Deficiency, Fractures)   Uric acid   Microalbumin / creatinine urine ratio   Urinalysis, Routine w reflex microscopic   EKG 12-Lead    Over 40 minutes of face to face interview, exam, counseling, chart review and critical decision making was performed Future Appointments  Date Time Provider [place]  06/12/2021  2:30 PM Liane Comber, NP [facility] None  12/02/2021  2:00 PM Liane Comber, NP [facility] None     Plan:   During the course of the visit the patient was educated and counseled about appropriate screening and preventive services including:   Pneumococcal vaccine  Prevnar 13 Influenza vaccine Td vaccine Screening electrocardiogram Bone densitometry screening Colorectal cancer screening Diabetes screening Glaucoma screening Nutrition counseling  Advanced directives: requested   Subjective:  Katie Cox is a 65 y.o. female who presents for CPE. She has History of colon polyps; Irritable bowel syndrome; GERD (gastroesophageal reflux disease); Hyperlipidemia, mixed; Essential hypertension; Vitamin D deficiency; Peripheral neuropathy; Hyperuricemia; Abnormal glucose; FHx: heart disease; Prediabetes; Anxiety; CKD (chronic kidney disease) stage 3, GFR 30-59 ml/min (HCC); and Osteopenia on their problem list.  She is single, 1 grown son, 4 grandchildren. 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   She has GERD controlled by lifestyle/medication.  Has IBS-D for many  years, frustrated about frequent diarrhea without clear trigger, discussed FODMAP elimination diet which she has not tried.   She has anxiety, off of lexapro as thought was making diarrhea worse but unsure, wants to retry.   BMI is Body mass index is 24.29 kg/m., she has been working  on diet and exercise. Limits red meat.  Wt Readings from Last 3 Encounters:  11/29/20 152 lb 12.8 oz (69.3 kg)  08/13/20 154 lb (69.9 kg)  06/07/20 148 lb (67.1 kg)   She has had elevated blood pressure for  years. Her blood pressure has been controlled at home, today their BP is BP: 116/72 She does not workout. She denies chest pain, shortness of breath, dizziness.   She is not on cholesterol medication and denies myalgias. Her cholesterol is not at goal. The cholesterol last visit was:   Lab Results  Component Value Date   CHOL 134 06/07/2020   HDL 59 06/07/2020   LDLCALC 54 06/07/2020   TRIG 129 06/07/2020   CHOLHDL 2.3 06/07/2020    She has not been working on diet and exercise for abnormal glucose/hx of prediabetes, and denies polydipsia, polyuria, visual disturbances, vomiting and weight loss. Last A1C in the office was:  Lab Results  Component Value Date   HGBA1C 5.3 06/07/2020   Last GFR: Lab Results  Component Value Date   GFRNONAA 49 (L) 06/07/2020   Patient is on Vitamin D supplement.   Lab Results  Component Value Date   VD25OH 75 11/29/2019      Patient is on allopurinol for hyperuricemia and does not report a recent flare.  Lab Results  Component Value Date   LABURIC 3.2 11/29/2019     Medication Review: Current Outpatient Medications on File Prior to Visit  Medication Sig Dispense Refill   allopurinol (ZYLOPRIM) 300 MG tablet TAKE 1 TABLET BY MOUTH  DAILY TO PREVENT GOUT 90 tablet 3   Ascorbic Acid (VITAMIN C WITH ROSE HIPS) 1000 MG tablet Take 1,000 mg by mouth daily.     aspirin 81 MG tablet Take 81 mg by mouth every other day.      Calcium Carbonate (CALCIUM 500 PO) Take by mouth daily.     famotidine (PEPCID) 40 MG tablet TAKE 1 TABLET BY MOUTH AT  BEDTIME TO PREVENT  INDIGESTION AND HEARTBURN 90 tablet 3   fluticasone (FLONASE) 50 MCG/ACT nasal spray Place 1 spray into both nostrils daily.     Magnesium 250 MG TABS Take 2 tablets by mouth 2 (two)  times daily.     Multiple Vitamin (MULTIVITAMIN) tablet Take 1 tablet by mouth daily.     OVER THE COUNTER MEDICATION Takes OTC Allegra for allergies.     potassium chloride SA (KLOR-CON) 20 MEQ tablet Take  1 tablet  2 x /day  for Potassium 180 tablet 3   rosuvastatin (CRESTOR) 20 MG tablet TAKE 1 TABLET BY MOUTH  DAILY FOR CHOLESTEROL 90 tablet 3   Zinc 50 MG CAPS Take by mouth daily.     No current facility-administered medications on file prior to visit.    Allergies  Allergen Reactions   Alprazolam Other (See Comments)     Unknown reaction per pt   Prednisone     Only high doses    Current Problems (verified) Patient Active Problem List   Diagnosis Date Noted   CKD (chronic kidney disease) stage 3, GFR 30-59 ml/min ([place]) 11/29/2020   Osteopenia 11/29/2020   Anxiety 08/23/2019   FHx: heart  disease 04/21/2018   Prediabetes 04/21/2018   Abnormal glucose 02/05/2015   Hyperuricemia 08/13/2014   GERD (gastroesophageal reflux disease)    Hyperlipidemia, mixed    Essential hypertension    Vitamin D deficiency    Peripheral neuropathy    History of colon polyps 10/18/2007   Irritable bowel syndrome 10/18/2007    Screening Tests Immunization History  Administered Date(s) Administered   Influenza Inj Mdck Quad With Preservative 04/21/2018, 05/18/2019, 06/07/2020   PFIZER(Purple Top)SARS-COV-2 Vaccination 09/19/2019, 10/12/2019   PPD Test 01/25/2014, 02/05/2015, 02/27/2016, 04/21/2018, 05/18/2019   Pneumococcal Conjugate-13 08/13/2020   Pneumococcal Polysaccharide-23 11/23/2008   Tdap 11/29/2009   Unspecified SARS-COV-2 Vaccination 09/19/2019   Preventative care: Last colonoscopy: 08/2019, polyps, 5 years, Dr. Hilarie Fredrickson Last mammogram: 02/2020 Last pap smear/pelvic exam: Dr. Danford Bad, follows annually DEXA:04/2020 (-1.6) Osteopenic  Prior vaccinations: TD or Tdap: 11/2009 - boosted today  Influenza: 05/2020 HD  Pneumococcal: 11/2008, due 07/2021 Prevnar13:  07/2020 Shingrix: 2/2 Covid 19: 2/2, pfizer  Names of Other Physician/Practitioners you currently use: 1. [facility] here for primary care 2. Eye Exam 07/2020 3. Dental Exam 2022, goes q67m  Patient Care Team: Unk Pinto, MD as PCP - General (Internal Medicine)  SURGICAL HISTORY She  has a past surgical history that includes goiter removed fro thyroid; Foot surgery; Colonoscopy; Cholecystectomy (04/14/2012); Open reduction internal fixation (orif) distal radial fracture (Right, 07/04/2015); carpel tunnel surgery (Right); Wisdom tooth extraction; Eye surgery; and Polypectomy. FAMILY HISTORY Her family history includes Breast cancer in her maternal aunt; COPD in her paternal grandfather; Colon cancer (age of onset: 78) in her father; Colon polyps in her father; Heart disease in her mother; Hypertension in her mother; Macular degeneration in her brother. SOCIAL HISTORY She  reports that she has never smoked. She has never used smokeless tobacco. She reports that she does not drink alcohol and does not use drugs.    Review of Systems  Constitutional:  Negative for malaise/fatigue and weight loss.  HENT:  Negative for hearing loss and tinnitus.   Eyes:  Negative for blurred vision and double vision.  Respiratory:  Negative for cough, shortness of breath and wheezing.   Cardiovascular:  Negative for chest pain, palpitations, orthopnea, claudication and leg swelling.  Gastrointestinal:  Positive for diarrhea (intermittent diarrhea for many years). Negative for abdominal pain, blood in stool, constipation, heartburn, melena, nausea and vomiting.  Genitourinary: Negative.   Musculoskeletal:  Negative for joint pain and myalgias.  Skin:  Negative for rash.  Neurological:  Negative for dizziness, tingling, sensory change, weakness and headaches.  Endo/Heme/Allergies:  Negative for polydipsia.  Psychiatric/Behavioral:  Negative for depression, substance  abuse and suicidal ideas. The patient is nervous/anxious. The patient does not have insomnia.   All other systems reviewed and are negative.   Objective:     Today's Vitals   11/29/20 1355  BP: 116/72  Pulse: (!) 59  Temp: (!) 96.4 F (35.8 C)  SpO2: 98%  Weight: 152 lb 12.8 oz (69.3 kg)  Height: 5' 6.5" (1.689 m)   Body mass index is 24.29 kg/m.  General appearance: alert, no distress, WD/WN, female HEENT: normocephalic, sclerae anicteric, TMs pearly, nares patent, no discharge or erythema, pharynx normal Oral cavity: MMM, no lesions Neck: supple, no lymphadenopathy, no thyromegaly, no masses Heart: RRR, normal S1, S2, no murmurs Lungs: CTA bilaterally, no wheezes, rhonchi, or rales Abdomen: +bs, soft, non tender, non distended, no masses, no hepatomegaly, no splenomegaly Musculoskeletal: nontender, no swelling, no obvious  deformity Extremities: no edema, no cyanosis, no clubbing Pulses: 2+ symmetric, upper and lower extremities, normal cap refill Neurological: alert, oriented x 3, CN2-12 intact, strength normal upper extremities and lower extremities, sensation normal throughout, DTRs 2+ throughout, no cerebellar signs, gait normal Psychiatric: normal affect, behavior normal, pleasant  GU: defer to GYN Breasts: defer to GYN  EKG: Sinus bradycardia  Medicare Attestation I have personally reviewed: The patient's medical and social history Their use of alcohol, tobacco or illicit drugs Their current medications and supplements The patient's functional ability including ADLs,fall risks, home safety risks, cognitive, and hearing and visual impairment Diet and physical activities Evidence for depression or mood disorders  The patient's weight, height, BMI, and visual acuity have been recorded in the chart.  I have made referrals, counseling, and provided education to the patient based on review of the above and I have provided the patient with a written personalized care plan  for preventive services.     [name], NP 2:16 PM [facility]

## 2020-11-30 LAB — CBC WITH DIFFERENTIAL/PLATELET
Absolute Monocytes: 619 cells/uL (ref 200–950)
Basophils Absolute: 86 cells/uL (ref 0–200)
Basophils Relative: 1 %
Eosinophils Absolute: 129 cells/uL (ref 15–500)
Eosinophils Relative: 1.5 %
HCT: 42.1 % (ref 35.0–45.0)
Hemoglobin: 14.2 g/dL (ref 11.7–15.5)
Lymphs Abs: 3397 cells/uL (ref 850–3900)
MCH: 32.6 pg (ref 27.0–33.0)
MCHC: 33.7 g/dL (ref 32.0–36.0)
MCV: 96.8 fL (ref 80.0–100.0)
MPV: 13.1 fL — ABNORMAL HIGH (ref 7.5–12.5)
Monocytes Relative: 7.2 %
Neutro Abs: 4369 cells/uL (ref 1500–7800)
Neutrophils Relative %: 50.8 %
Platelets: 278 10*3/uL (ref 140–400)
RBC: 4.35 10*6/uL (ref 3.80–5.10)
RDW: 11.9 % (ref 11.0–15.0)
Total Lymphocyte: 39.5 %
WBC: 8.6 10*3/uL (ref 3.8–10.8)

## 2020-11-30 LAB — COMPLETE METABOLIC PANEL WITH GFR
AG Ratio: 1.8 (calc) (ref 1.0–2.5)
ALT: 22 U/L (ref 6–29)
AST: 27 U/L (ref 10–35)
Albumin: 4.7 g/dL (ref 3.6–5.1)
Alkaline phosphatase (APISO): 72 U/L (ref 37–153)
BUN/Creatinine Ratio: 32 (calc) — ABNORMAL HIGH (ref 6–22)
BUN: 33 mg/dL — ABNORMAL HIGH (ref 7–25)
CO2: 31 mmol/L (ref 20–32)
Calcium: 10.6 mg/dL — ABNORMAL HIGH (ref 8.6–10.4)
Chloride: 96 mmol/L — ABNORMAL LOW (ref 98–110)
Creat: 1.04 mg/dL — ABNORMAL HIGH (ref 0.50–0.99)
GFR, Est African American: 65 mL/min/{1.73_m2} (ref >=60)
GFR, Est Non African American: 56 mL/min/{1.73_m2} — ABNORMAL LOW (ref >=60)
Globulin: 2.6 g/dL (calc) (ref 1.9–3.7)
Glucose, Bld: 82 mg/dL (ref 65–99)
Potassium: 3.9 mmol/L (ref 3.5–5.3)
Sodium: 138 mmol/L (ref 135–146)
Total Bilirubin: 0.8 mg/dL (ref 0.2–1.2)
Total Protein: 7.3 g/dL (ref 6.1–8.1)

## 2020-11-30 LAB — VITAMIN D 25 HYDROXY (VIT D DEFICIENCY, FRACTURES): Vit D, 25-Hydroxy: 84 ng/mL (ref 30–100)

## 2020-11-30 LAB — IRON,TIBC AND FERRITIN PANEL
%SAT: 23 % (calc) (ref 16–45)
Ferritin: 46 ng/mL (ref 16–288)
Iron: 92 ug/dL (ref 45–160)
TIBC: 400 mcg/dL (calc) (ref 250–450)

## 2020-11-30 LAB — URINALYSIS, ROUTINE W REFLEX MICROSCOPIC
Bilirubin Urine: NEGATIVE
Glucose, UA: NEGATIVE
Hgb urine dipstick: NEGATIVE
Ketones, ur: NEGATIVE
Leukocytes,Ua: NEGATIVE
Nitrite: NEGATIVE
Protein, ur: NEGATIVE
Specific Gravity, Urine: 1.007 (ref 1.001–1.035)
pH: 7 (ref 5.0–8.0)

## 2020-11-30 LAB — MICROALBUMIN / CREATININE URINE RATIO
Creatinine, Urine: 15 mg/dL — ABNORMAL LOW (ref 20–275)
Microalb Creat Ratio: 73 mcg/mg creat — ABNORMAL HIGH (ref <30)
Microalb, Ur: 1.1 mg/dL

## 2020-11-30 LAB — LIPID PANEL
Cholesterol: 168 mg/dL (ref <200)
HDL: 60 mg/dL (ref >=50)
LDL Cholesterol (Calc): 78 mg/dL (calc)
Non-HDL Cholesterol (Calc): 108 mg/dL (calc) (ref <130)
Total CHOL/HDL Ratio: 2.8 (calc) (ref <5.0)
Triglycerides: 198 mg/dL — ABNORMAL HIGH (ref <150)

## 2020-11-30 LAB — HEMOGLOBIN A1C
Hgb A1c MFr Bld: 5.2 % of total Hgb (ref <5.7)
Mean Plasma Glucose: 103 mg/dL
eAG (mmol/L): 5.7 mmol/L

## 2020-11-30 LAB — MAGNESIUM: Magnesium: 2.5 mg/dL (ref 1.5–2.5)

## 2020-11-30 LAB — URIC ACID: Uric Acid, Serum: 3.5 mg/dL (ref 2.5–7.0)

## 2020-11-30 LAB — TSH: TSH: 1.96 mIU/L (ref 0.40–4.50)

## 2020-12-05 ENCOUNTER — Other Ambulatory Visit: Payer: Self-pay | Admitting: Adult Health

## 2020-12-05 ENCOUNTER — Telehealth: Payer: Self-pay

## 2020-12-05 NOTE — Telephone Encounter (Signed)
Patient is requesting to talk to you about her new medications. Has been throwing up and having diarrhea.

## 2020-12-10 ENCOUNTER — Encounter: Payer: Medicare Other | Admitting: Adult Health

## 2020-12-10 ENCOUNTER — Encounter: Payer: Medicare Other | Admitting: Adult Health Nurse Practitioner

## 2021-02-11 DIAGNOSIS — L821 Other seborrheic keratosis: Secondary | ICD-10-CM | POA: Diagnosis not present

## 2021-02-11 DIAGNOSIS — L720 Epidermal cyst: Secondary | ICD-10-CM | POA: Diagnosis not present

## 2021-02-14 ENCOUNTER — Other Ambulatory Visit: Payer: Self-pay | Admitting: Obstetrics and Gynecology

## 2021-02-14 DIAGNOSIS — Z1231 Encounter for screening mammogram for malignant neoplasm of breast: Secondary | ICD-10-CM

## 2021-03-05 ENCOUNTER — Ambulatory Visit (INDEPENDENT_AMBULATORY_CARE_PROVIDER_SITE_OTHER): Payer: Medicare Other | Admitting: Adult Health

## 2021-03-05 ENCOUNTER — Other Ambulatory Visit: Payer: Self-pay

## 2021-03-05 ENCOUNTER — Encounter: Payer: Self-pay | Admitting: Adult Health

## 2021-03-05 VITALS — BP 102/62 | HR 55 | Temp 96.1°F | Wt 154.4 lb

## 2021-03-05 DIAGNOSIS — H6501 Acute serous otitis media, right ear: Secondary | ICD-10-CM

## 2021-03-05 MED ORDER — PREDNISONE 20 MG PO TABS: ORAL_TABLET | ORAL | 0 refills | Status: DC

## 2021-03-05 NOTE — Patient Instructions (Signed)
Try fexofenadine (allegra) after completing steroid IF the symptoms start coming back   Try saline irrigations/sprays  Do flonase 1 spray in each nostril once daily AS NEEDED.       Otitis Media With Effusion, Adult Otitis media with effusion (OME) is inflammation and fluid (effusion) in the middle ear without having an ear infection. The middle ear is the space behind the eardrum. The middle ear is connected to the back of the throat by a narrow tube (eustachian tube). Normally the eustachian tube drains fluid out of the middle ear. A swollen eustachian tube can become blocked and cause fluid to collect in the middle ear. OME often goes away without treatment. Sometimes OME can lead to hearing problems and recurrent acute ear infections (acute otitis media). These conditions may require treatment. What are the causes? OME is caused by a blocked eustachian tube. This can result from: Allergies. Upper respiratory infections. Enlarged adenoids. The adenoids are areas of soft tissue located high in the back of the throat, behind the nose and the roof of the mouth. They are part of the body's natural defense system (immune system). Rapid changes in pressure, like when an airplane is descending or during scuba diving. In some cases, the cause of this condition is not known. What are the signs or symptoms? Common symptoms of this condition include: A feeling of fullness in your ear. Decreased hearing in the affected ear. Fluid draining into the ear canal. Pain in the ear. In some cases, there are no symptoms. How is this diagnosed? A health care provider can diagnose OME based on signs and symptoms of the condition. Your provider will also do a physical exam to check for fluid behind the eardrum. During the exam, your health care provider will use an instrument called an otoscope to look in your ear. Your health care provider may do other tests, such as: A hearing test. A tympanogram.  This is a test that shows how well the eardrum moves in response to air pressure in the ear canal. It provides a graph for your health care provider to review. A pneumatic otoscopy. This is a test to check how your eardrum moves in response to changes in pressure. It is done by squeezing a small amount of air into the ear. How is this treated? Treatment for OME depends on the cause of the condition and the severity of symptoms. The first step is often waiting to see if the fluid drains on its own in a few weeks. Home care treatment may include: Over-the-counter pain relievers. A warm, moist cloth placed over the ear. Severe cases may require a procedure to insert tubes in the ears (tympanostomy tubes) to drain the fluid. Follow these instructions at home: Take over-the-counter and prescription medicines only as told by your health care provider. Keep all follow-up visits. Contact a health care provider if: You have pain that gets worse. Hearing in your affected ear gets worse. You have fluid draining from your ear canal. You have dizziness. You develop a fever. Get help right away if: You develop a severe headache. You completely lose hearing in the affected ear. You have bleeding from your ear canal. You have sudden and severe pain in your ear. These symptoms may represent a serious problem that is an emergency. Do not wait to see if the symptoms will go away. Get medical help right away. Call your local emergency services (911 in the [place] Do not drive yourself to the  hospital. Summary Otitis media with effusion (OME) is inflammation and fluid (effusion) in the middle ear without having an ear infection. A swollen eustachian tube can become blocked and cause fluid to collect in the middle ear. Treatment for OME depends on the cause of the condition and the severity of symptoms. Many times, treatment is not needed because the fluid drains on its own in a few weeks. Sometimes OME can lead  to hearing problems and recurrent acute ear infections (acute otitis media), which may require treatment. This information is not intended to replace advice given to you by your health care provider. Make sure you discuss any questions you have with your health care provider. Document Revised: 10/04/2020 Document Reviewed: 10/04/2020 Elsevier Patient Education  [place].

## 2021-03-05 NOTE — Progress Notes (Signed)
Assessment and Plan:  Katie Cox was seen today for ear pain.  Diagnoses and all orders for this visit:  Non-recurrent acute serous otitis media of right ear Otitis effusion- no infection- will give steroid taper, then if recurrent sx suggest flonase/nasonex, allergy pills, and hold nose while drinking water to autoinflate, if drainage from ear, fever, chills, HA, nausea, call the office or go to the ER Avoid fan blowing on her at night, saline irrigations to avoid need for daily steroid daily if chronic -     predniSONE (DELTASONE) 20 MG tablet; 2 tablets daily for 3 days, 1 tablet daily for 4 days.  Further disposition pending results of labs. Discussed med's effects and SE's.   Over 15 minutes of exam, counseling, chart review, and critical decision making was performed.   Future Appointments  Date Time Provider [place]  03/19/2021  5:00 PM [facility] 2 [facility]  06/12/2021  2:30 PM Liane Comber, NP [facility] None  12/02/2021  2:00 PM Liane Comber, NP [facility] None    ------------------------------------------------------------------------------------------------------------------   HPI BP 102/62   Pulse (!) 55   Temp (!) 96.1 F (35.6 C)   Wt 154 lb 6.4 oz (70 kg)   SpO2 99%   BMI 24.55 kg/m  65 y.o.female presents for evaluation of R ear pain x 3 days.   She reports 3 days ago started having some discomfort in throat with swallowing, then developed R ear discomfort, denies sharp pain, describes as pressure. She denies discharge. She also notes congestion, some sinus headache. Denies fever/chills or sinus tenderness.   She reports has taken mucinex sinus with mild improvement, helps her sleep. She has flonase that sh uses intermittently, hasn't   She denies watery/itchy eyes, sneezing. Denies known hx of allergies. She does endorse mild cough, associated with post - nasal drip. She does note sinus fullness/congestion intermittently ongoing for  several months. She does sleep with fan on.   Denies pain with moving jaw, chewing, popping, clicking.     Past Medical History:  Diagnosis Date   Abnormal glucose 02/05/2015   Allergy    seasonal   Contact lens/glasses fitting    wears contacts or glasses   GERD (gastroesophageal reflux disease)    Hyperlipidemia    Hypertension    Hyperuricemia 08/13/2014   Irritable bowel syndrome 10/18/2007   Irritable bowel syndrome (IBS)    Medication management 12/26/2013   Peripheral neuropathy    in feet   POLYP, COLON 10/18/2007   PONV (postoperative nausea and vomiting)    nausea, slow to wake up at times   Vitamin D deficiency      Allergies  Allergen Reactions   Alprazolam Other (See Comments)     Unknown reaction per pt   Lexapro [Escitalopram]     Diarrhea/nausea    Prednisone     Only high doses    Current Outpatient Medications on File Prior to Visit  Medication Sig   allopurinol (ZYLOPRIM) 300 MG tablet TAKE 1 TABLET BY MOUTH  DAILY TO PREVENT GOUT   Ascorbic Acid (VITAMIN C WITH ROSE HIPS) 1000 MG tablet Take 1,000 mg by mouth daily.   aspirin 81 MG tablet Take 81 mg by mouth every other day.    Calcium Carbonate (CALCIUM 500 PO) Take by mouth daily.   famotidine (PEPCID) 40 MG tablet TAKE 1 TABLET BY MOUTH AT  BEDTIME TO PREVENT  INDIGESTION AND HEARTBURN   furosemide (LASIX) 20 MG tablet Take 1-2 tabs daily  as needed for swelling and blood pressure.   Magnesium 250 MG TABS Take 2 tablets by mouth 2 (two) times daily.   Multiple Vitamin (MULTIVITAMIN) tablet Take 1 tablet by mouth daily.   OVER THE COUNTER MEDICATION Takes OTC Allegra for allergies.   potassium chloride SA (KLOR-CON) 20 MEQ tablet Take  1 tablet  2 x /day  for Potassium   rosuvastatin (CRESTOR) 20 MG tablet TAKE 1 TABLET BY MOUTH  DAILY FOR CHOLESTEROL   Zinc 50 MG CAPS Take by mouth daily.   fluticasone (FLONASE) 50 MCG/ACT nasal spray Place 1 spray into both nostrils daily.   No current  facility-administered medications on file prior to visit.    ROS: all negative except above.   Physical Exam:  BP 102/62   Pulse (!) 55   Temp (!) 96.1 F (35.6 C)   Wt 154 lb 6.4 oz (70 kg)   SpO2 99%   BMI 24.55 kg/m   General Appearance: Well nourished, in no apparent distress. Eyes: PERRLA, conjunctiva no swelling or erythema Sinuses: No Frontal/maxillary tenderness ENT/Mouth: Bil Ext aud canals clear, L TMs without erythema, bulging. R without mucus or erythema but appears mildly bulging. No erythema, swelling, or exudate on post pharynx.  Tonsils not swollen or erythematous. Hearing normal.  Neck: Supple, thyroid normal.  Respiratory: Respiratory effort normal, BS equal bilaterally without rales, rhonchi, wheezing or stridor.  Cardio: RRR with no MRGs. Brisk peripheral pulses without edema.  Lymphatics: Non tender without lymphadenopathy.  Musculoskeletal: No TMJ tenderness, popping, clicking or neck tenderness. Normal gait.  Skin: Warm, dry without rashes, lesions, ecchymosis.  Neuro: Normal muscle tone Psych: Awake and oriented X 3, normal affect, Insight and Judgment appropriate.     Katie Ribas, NP 9:46 AM [facility]

## 2021-03-19 ENCOUNTER — Other Ambulatory Visit: Payer: Self-pay

## 2021-03-19 ENCOUNTER — Ambulatory Visit
Admission: RE | Admit: 2021-03-19 | Discharge: 2021-03-19 | Disposition: A | Discharge status: Home / Self Care | Payer: Medicare Other | Source: Ambulatory Visit | Attending: Obstetrics and Gynecology | Admitting: Obstetrics and Gynecology

## 2021-03-19 DIAGNOSIS — Z1231 Encounter for screening mammogram for malignant neoplasm of breast: Secondary | ICD-10-CM | POA: Diagnosis not present

## 2021-03-28 ENCOUNTER — Other Ambulatory Visit: Payer: Self-pay | Admitting: Adult Health

## 2021-04-15 ENCOUNTER — Encounter: Payer: Self-pay | Admitting: Nurse Practitioner

## 2021-04-15 ENCOUNTER — Other Ambulatory Visit: Payer: Self-pay

## 2021-04-15 ENCOUNTER — Ambulatory Visit (INDEPENDENT_AMBULATORY_CARE_PROVIDER_SITE_OTHER): Payer: Medicare Other | Admitting: Nurse Practitioner

## 2021-04-15 VITALS — BP 120/64 | HR 51 | Temp 97.7°F | Wt 154.6 lb

## 2021-04-15 DIAGNOSIS — R600 Localized edema: Secondary | ICD-10-CM

## 2021-04-15 DIAGNOSIS — I10 Essential (primary) hypertension: Secondary | ICD-10-CM

## 2021-04-15 DIAGNOSIS — N1831 Chronic kidney disease, stage 3a: Secondary | ICD-10-CM

## 2021-04-15 DIAGNOSIS — Z79899 Other long term (current) drug therapy: Secondary | ICD-10-CM

## 2021-04-15 MED ORDER — BUMETANIDE 2 MG PO TABS: ORAL_TABLET | ORAL | 1 refills | Status: DC

## 2021-04-15 NOTE — Patient Instructions (Signed)
Bumetanide Oral Tablets What is this medication? BUMETANIDE (byoo MET a nide) is a diuretic. It helps you make more urine and to lose salt and excess water from your body. It treats swelling from heart, kidney, or liver disease. This medicine may be used for other purposes; ask your health care provider or pharmacist if you have questions. COMMON BRAND NAME(S): Bumex What should I tell my care team before I take this medication? They need to know if you have any of these conditions: dehydration diarrhea irregular heartbeat or rhythm kidney problems liver disease low levels of electrolytes, like magnesium, potassium, or sodium, in your blood vomiting an unusual or allergic reaction to bumetanide, sulfa drugs, other drugs, foods, dyes or preservatives pregnant or trying to get pregnant breast-feeding How should I use this medication? Take this drug by mouth. Take it as directed on the prescription label at the same time every day. You can take it with or without food. If it upsets your stomach, take it with food. Keep taking it unless your health care provider tells you to stop. Talk to your health care provider about the use of this drug in children. Special care may be needed. Overdosage: If you think you have taken too much of this medicine contact a poison control center or emergency room at once. NOTE: This medicine is only for you. Do not share this medicine with others. What if I miss a dose? If you miss a dose, take it as soon as you can. If it is almost time for your next dose, take only that dose. Do not take double or extra doses. What may interact with this medication? alcohol certain antibiotics given by injection diuretics heart medicines like digoxin and dofetilide hormones like cortisone, fludrocortisone, hydrocortisone lithium medicines for diabetes medicines for high blood pressure medicines for inflammation like indomethacin OTC supplements like ginseng and  ephedra This list may not describe all possible interactions. Give your health care provider a list of all the medicines, herbs, non-prescription drugs, or dietary supplements you use. Also tell them if you smoke, drink alcohol, or use illegal drugs. Some items may interact with your medicine. What should I watch for while using this medication? Visit your health care provider for regular checks on your progress. Check your blood pressure as directed. Ask your health care provider what your blood pressure should be. Also, find out when you should contact him or her. You may need to be on a special diet while you are taking this drug. Ask your health care provider. Also, find out how many glasses of fluids you need to drink each day. Check with your health care provider if you have severe diarrhea, nausea, and vomiting, or if you sweat a lot. The loss of too much body fluid may make it dangerous for you to take this drug. Be careful brushing or flossing your teeth or using a toothpick because you may get an infection or bleed more easily. If you have any dental work done, tell your dentist you are receiving this drug. You may get drowsy or dizzy. Do not drive, use machinery, or do anything that needs mental alertness until you know how this drug affects you. Do not stand up or sit up quickly, especially if you are an older patient. This reduces the risk of dizzy or fainting spells. Do not treat yourself for coughs, colds, or pain while you are using this drug without asking your health care provider for advice. Some drugs may increase  your blood pressure. What side effects may I notice from receiving this medication? Side effects that you should report to your doctor or health care provider as soon as possible: allergic reactions (skin rash, itching or hives; swelling of the face, lips, or tongue) decreased hearing, ringing of the ears kidney injury (trouble passing urine or change in the amount of  urine) low blood pressure (dizziness; feeling faint or lightheaded, falls; unusually weak or tired) low potassium levels (trouble breathing; chest pain; dizziness; fast, irregular heartbeat; feeling faint or lightheaded, falls; muscle cramps or pain) severe diarrhea unusual bruising or bleeding Side effects that usually do not require medical attention (report to your doctor or health care provider if they continue or are bothersome): headache increased thirst loss of appetite muscle weakness nausea stomach pain unusual sweating vomiting This list may not describe all possible side effects. Call your doctor for medical advice about side effects. You may report side effects to FDA at 1-800-FDA-1088. Where should I keep my medication? Keep out of the reach of children and pets. Store at room temperature between 15 and 30 degrees C (59 and 86 degrees F). Throw away any unused drug after the expiration date. NOTE: This sheet is a summary. It may not cover all possible information. If you have questions about this medicine, talk to your doctor, pharmacist, or health care provider.  2022 [employer]vier/Gold Standard (2019-03-29 15:20:28)

## 2021-04-15 NOTE — Progress Notes (Deleted)
Assessment and Plan:  1. Essential hypertension -- continue medications, DASH diet, exercise and monitor at home. Call if greater than 130/80.   - COMPLETE METABOLIC PANEL WITH GFR - bumetanide (BUMEX) 2 MG tablet; TAKE 1 TABLET TWICE A DAY FOR BLOOD PRESSURE AND FLUID  Dispense: 180 tablet; Refill: 1  2. Stage 3a chronic kidney disease (HCC) - Increase fluids, avoid NSAIDS,  will monitor  Continue Bumex, if kidney functions drop again will change to Lasix - COMPLETE METABOLIC PANEL WITH GFR  3. Medication management  - COMPLETE METABOLIC PANEL WITH GFR  4. Edema of both feet - Compressions socks daily, elevate legs when gets home from work.  Recommend using foot stool when at work. Continue Bumex ,will change to Lasix 40 mg qd if kidney functions are dropping.  - COMPLETE METABOLIC PANEL WITH GFR Continue     Further disposition pending results of labs. Discussed med's effects and SE's.   Over 30 minutes of exam, counseling, chart review, and critical decision making was performed.   Future Appointments  Date Time Provider [place]  06/12/2021  2:30 PM Liane Comber, NP [facility] None  12/02/2021  2:00 PM Liane Comber, NP [facility] None    ------------------------------------------------------------------------------------------------------------------   HPI BP 120/64   Pulse (!) 51   Temp 97.7 F (36.5 C)   Wt 154 lb 9.6 oz (70.1 kg)   SpO2 99%   BMI 24.58 kg/m  65 y.o.female presents for edema  Patient is a very nice 65 yo DWF with HTN & chronic dependefnt edema from chronic venous insufficiency who has Stage 3a CKD and  about 2-3 weeks ago was switched from Bumex 2 mg to Lasix 20 mg and promptly gained about 10 # water weight with worse swelling of her legs. She was advised to increase her  Bumex  / Lasix  BMI is Body mass index is 24.58 kg/m., she has not been working on diet and exercise. Wt Readings from Last 3 Encounters:  04/15/21 154 lb  9.6 oz (70.1 kg)  03/05/21 154 lb 6.4 oz (70 kg)  11/29/20 152 lb 12.8 oz (69.3 kg)    Past Medical History:  Diagnosis Date   Abnormal glucose 02/05/2015   Allergy    seasonal   Contact lens/glasses fitting    wears contacts or glasses   GERD (gastroesophageal reflux disease)    Hyperlipidemia    Hypertension    Hyperuricemia 08/13/2014   Irritable bowel syndrome 10/18/2007   Irritable bowel syndrome (IBS)    Medication management 12/26/2013   Peripheral neuropathy    in feet   POLYP, COLON 10/18/2007   PONV (postoperative nausea and vomiting)    nausea, slow to wake up at times   Vitamin D deficiency      Allergies  Allergen Reactions   Alprazolam Other (See Comments)     Unknown reaction per pt   Lexapro [Escitalopram]     Diarrhea/nausea    Prednisone     Only high doses    Current Outpatient Medications on File Prior to Visit  Medication Sig   allopurinol (ZYLOPRIM) 300 MG tablet TAKE 1 TABLET BY MOUTH  DAILY TO PREVENT GOUT   Ascorbic Acid (VITAMIN C WITH ROSE HIPS) 1000 MG tablet Take 1,000 mg by mouth daily.   aspirin 81 MG tablet Take 81 mg by mouth every other day.    famotidine (PEPCID) 40 MG tablet TAKE 1 TABLET BY MOUTH AT  BEDTIME TO PREVENT  INDIGESTION AND HEARTBURN  Magnesium 250 MG TABS Take 2 tablets by mouth 2 (two) times daily.   Multiple Vitamin (MULTIVITAMIN) tablet Take 1 tablet by mouth daily.   OVER THE COUNTER MEDICATION Takes OTC Allegra for allergies.   potassium chloride SA (KLOR-CON) 20 MEQ tablet Take  1 tablet  2 x /day  for Potassium   rosuvastatin (CRESTOR) 20 MG tablet TAKE 1 TABLET BY MOUTH  DAILY FOR CHOLESTEROL   Zinc 50 MG CAPS Take by mouth daily.   Calcium Carbonate (CALCIUM 500 PO) Take by mouth daily. (Patient not taking: Reported on 04/15/2021)   fluticasone (FLONASE) 50 MCG/ACT nasal spray Place 1 spray into both nostrils daily.   predniSONE (DELTASONE) 20 MG tablet 2 tablets daily for 3 days, 1 tablet daily for 4 days.  (Patient not taking: Reported on 04/15/2021)   No current facility-administered medications on file prior to visit.    ROS: all negative except above.   Physical Exam:  BP 120/64   Pulse (!) 51   Temp 97.7 F (36.5 C)   Wt 154 lb 9.6 oz (70.1 kg)   SpO2 99%   BMI 24.58 kg/m   General Appearance: Well nourished, in no apparent distress. Eyes: PERRLA, EOMs, conjunctiva no swelling or erythema Sinuses: No Frontal/maxillary tenderness ENT/Mouth: Ext aud canals clear, TMs without erythema, bulging. No erythema, swelling, or exudate on post pharynx.  Tonsils not swollen or erythematous. Hearing normal.  Neck: Supple, thyroid normal.  Respiratory: Respiratory effort normal, BS equal bilaterally without rales, rhonchi, wheezing or stridor.  Cardio: RRR with no MRGs. Brisk peripheral pulses with 1+ nonpitting edema of ankles/feet Abdomen: Soft, + BS.  Non tender, no guarding, rebound, hernias, masses. Lymphatics: Non tender without lymphadenopathy.  Musculoskeletal: Full ROM, 5/5 strength, normal gait.  Skin: Warm, dry without rashes, lesions, ecchymosis.  Neuro: Cranial nerves intact. Normal muscle tone, no cerebellar symptoms. Sensation intact.  Psych: Awake and oriented X 3, normal affect, Insight and Judgment appropriate.     Magda Bernheim, NP 5:02 PM [facility]

## 2021-04-15 NOTE — Progress Notes (Signed)
Assessment and Plan:  1. Essential hypertension -- continue medications, DASH diet, exercise and monitor at home. Call if greater than 130/80.   - COMPLETE METABOLIC PANEL WITH GFR - bumetanide (BUMEX) 2 MG tablet; TAKE 1 TABLET TWICE A DAY FOR BLOOD PRESSURE AND FLUID  Dispense: 180 tablet; Refill: 1  2. Stage 3a chronic kidney disease (HCC) - Increase fluids, avoid NSAIDS,  will monitor  Continue Bumex, if kidney functions drop again will change to Lasix - COMPLETE METABOLIC PANEL WITH GFR  3. Medication management  - COMPLETE METABOLIC PANEL WITH GFR  4. Edema of both feet - Compressions socks daily, elevate legs when gets home from work.  Recommend using foot stool when at work. Continue Bumex ,will change to Lasix 40 mg qd if kidney functions are dropping.  - COMPLETE METABOLIC PANEL WITH GFR Continue     Further disposition pending results of labs. Discussed med's effects and SE's.   Over 30 minutes of exam, counseling, chart review, and critical decision making was performed.   Future Appointments  Date Time Provider Industry  06/12/2021  2:30 PM Liane Comber, NP [facility] None  12/02/2021  2:00 PM Liane Comber, NP [facility] None    ------------------------------------------------------------------------------------------------------------------   HPI BP 120/64   Pulse (!) 51   Temp 97.7 F (36.5 C)   Wt 154 lb 9.6 oz (70.1 kg)   SpO2 99%   BMI 24.58 kg/m  65 y.o.female presents for edema  Patient is a very nice 65 yo DWF with HTN & chronic dependefnt edema from chronic venous insufficiency who has Stage 3a CKD and  about 2-3 weeks ago was switched from Bumex 2 mg to Lasix 20 mg and promptly gained about 10 # water weight with worse swelling of her legs. She was advised to increase her  Bumex  / Lasix  BMI is Body mass index is 24.58 kg/m., she has not been working on diet and exercise. Wt Readings from Last 3 Encounters:  04/15/21 154 lb  9.6 oz (70.1 kg)  03/05/21 154 lb 6.4 oz (70 kg)  11/29/20 152 lb 12.8 oz (69.3 kg)    Past Medical History:  Diagnosis Date   Abnormal glucose 02/05/2015   Allergy    seasonal   Contact lens/glasses fitting    wears contacts or glasses   GERD (gastroesophageal reflux disease)    Hyperlipidemia    Hypertension    Hyperuricemia 08/13/2014   Irritable bowel syndrome 10/18/2007   Irritable bowel syndrome (IBS)    Medication management 12/26/2013   Peripheral neuropathy    in feet   POLYP, COLON 10/18/2007   PONV (postoperative nausea and vomiting)    nausea, slow to wake up at times   Vitamin D deficiency      Allergies  Allergen Reactions   Alprazolam Other (See Comments)     Unknown reaction per pt   Lexapro [Escitalopram]     Diarrhea/nausea    Prednisone     Only high doses    Current Outpatient Medications on File Prior to Visit  Medication Sig   allopurinol (ZYLOPRIM) 300 MG tablet TAKE 1 TABLET BY MOUTH  DAILY TO PREVENT GOUT   Ascorbic Acid (VITAMIN C WITH ROSE HIPS) 1000 MG tablet Take 1,000 mg by mouth daily.   aspirin 81 MG tablet Take 81 mg by mouth every other day.    famotidine (PEPCID) 40 MG tablet TAKE 1 TABLET BY MOUTH AT  BEDTIME TO PREVENT  INDIGESTION AND HEARTBURN  Magnesium 250 MG TABS Take 2 tablets by mouth 2 (two) times daily.   Multiple Vitamin (MULTIVITAMIN) tablet Take 1 tablet by mouth daily.   OVER THE COUNTER MEDICATION Takes OTC Allegra for allergies.   potassium chloride SA (KLOR-CON) 20 MEQ tablet Take  1 tablet  2 x /day  for Potassium   rosuvastatin (CRESTOR) 20 MG tablet TAKE 1 TABLET BY MOUTH  DAILY FOR CHOLESTEROL   Zinc 50 MG CAPS Take by mouth daily.   Calcium Carbonate (CALCIUM 500 PO) Take by mouth daily. (Patient not taking: Reported on 04/15/2021)   fluticasone (FLONASE) 50 MCG/ACT nasal spray Place 1 spray into both nostrils daily.   predniSONE (DELTASONE) 20 MG tablet 2 tablets daily for 3 days, 1 tablet daily for 4 days.  (Patient not taking: Reported on 04/15/2021)   No current facility-administered medications on file prior to visit.    ROS: all negative except above.   Physical Exam:  BP 120/64   Pulse (!) 51   Temp 97.7 F (36.5 C)   Wt 154 lb 9.6 oz (70.1 kg)   SpO2 99%   BMI 24.58 kg/m   General Appearance: Well nourished, in no apparent distress. Eyes: PERRLA, EOMs, conjunctiva no swelling or erythema Sinuses: No Frontal/maxillary tenderness ENT/Mouth: Ext aud canals clear, TMs without erythema, bulging. No erythema, swelling, or exudate on post pharynx.  Tonsils not swollen or erythematous. Hearing normal.  Neck: Supple, thyroid normal.  Respiratory: Respiratory effort normal, BS equal bilaterally without rales, rhonchi, wheezing or stridor.  Cardio: RRR with no MRGs. Brisk peripheral pulses with 1+ nonpitting edema of ankles/feet Abdomen: Soft, + BS.  Non tender, no guarding, rebound, hernias, masses. Lymphatics: Non tender without lymphadenopathy.  Musculoskeletal: Full ROM, 5/5 strength, normal gait.  Skin: Warm, dry without rashes, lesions, ecchymosis.  Neuro: Cranial nerves intact. Normal muscle tone, no cerebellar symptoms. Sensation intact.  Psych: Awake and oriented X 3, normal affect, Insight and Judgment appropriate.     Magda Bernheim, NP 5:02 PM [facility]

## 2021-04-16 ENCOUNTER — Telehealth: Payer: Self-pay

## 2021-04-16 LAB — COMPLETE METABOLIC PANEL WITH GFR
AG Ratio: 1.9 (calc) (ref 1.0–2.5)
ALT: 27 U/L (ref 6–29)
AST: 35 U/L (ref 10–35)
Albumin: 4.8 g/dL (ref 3.6–5.1)
Alkaline phosphatase (APISO): 82 U/L (ref 37–153)
BUN/Creatinine Ratio: 28 (calc) — ABNORMAL HIGH (ref 6–22)
BUN: 27 mg/dL — ABNORMAL HIGH (ref 7–25)
CO2: 31 mmol/L (ref 20–32)
Calcium: 10.3 mg/dL (ref 8.6–10.4)
Chloride: 98 mmol/L (ref 98–110)
Creat: 0.96 mg/dL (ref 0.50–1.05)
Globulin: 2.5 g/dL (calc) (ref 1.9–3.7)
Glucose, Bld: 78 mg/dL (ref 65–99)
Potassium: 3.9 mmol/L (ref 3.5–5.3)
Sodium: 140 mmol/L (ref 135–146)
Total Bilirubin: 1.1 mg/dL (ref 0.2–1.2)
Total Protein: 7.3 g/dL (ref 6.1–8.1)
eGFR: 65 mL/min/{1.73_m2} (ref >=60)

## 2021-04-16 LAB — CBC WITH DIFFERENTIAL/PLATELET
Absolute Monocytes: 707 cells/uL (ref 200–950)
Basophils Absolute: 91 cells/uL (ref 0–200)
Basophils Relative: 0.9 %
Eosinophils Absolute: 91 cells/uL (ref 15–500)
Eosinophils Relative: 0.9 %
HCT: 43.5 % (ref 35.0–45.0)
Hemoglobin: 15 g/dL (ref 11.7–15.5)
Lymphs Abs: 3848 cells/uL (ref 850–3900)
MCH: 32.9 pg (ref 27.0–33.0)
MCHC: 34.5 g/dL (ref 32.0–36.0)
MCV: 95.4 fL (ref 80.0–100.0)
MPV: 13.2 fL — ABNORMAL HIGH (ref 7.5–12.5)
Monocytes Relative: 7 %
Neutro Abs: 5363 cells/uL (ref 1500–7800)
Neutrophils Relative %: 53.1 %
Platelets: 292 10*3/uL (ref 140–400)
RBC: 4.56 10*6/uL (ref 3.80–5.10)
RDW: 12.5 % (ref 11.0–15.0)
Total Lymphocyte: 38.1 %
WBC: 10.1 10*3/uL (ref 3.8–10.8)

## 2021-04-16 NOTE — Telephone Encounter (Signed)
Patient has questions about her labs.

## 2021-04-16 NOTE — Telephone Encounter (Signed)
You can advise kidney functions remained stable so can use Bumex 2 mg BID

## 2021-06-03 NOTE — Progress Notes (Signed)
MEDICARE ANNUAL WELLNESS VISIT AND FOLLOW UP  Assessment:   Katie Cox was seen today for medicare wellness.  Diagnoses and all orders for this visit:  Encounter for Medicare Annual Wellness Visit Yearly  Needs flu shot High dose flu vaccine given Received today  Essential hypertension Monitor blood pressure at home; call if consistently over 130/80 Continue DASH diet.   Reminder to go to the ER if any CP, SOB, nausea, dizziness, severe HA, changes vision/speech, left arm numbness and tingling and jaw pain. -     CBC with Differential/Platelet -     COMPLETE METABOLIC PANEL WITH GFR  Hyperlipidemia, mixed Continue medications: rosuvastatin 20mg  Discussed dietary and exercise modifications Low fat diet -     Lipid panel  Abnormal glucose Discussed dietary and exercise modifications -     Hemoglobin A1c  Vitamin D deficiency Continue supplementation to maintain goal of 70-100  Gastroesophageal reflux disease, unspecified whether esophagitis present Doing well at this time Continue: famotidine 40mg  Diet discussed Monitor for triggers Avoid food with high acid content Avoid excessive cafeine Increase water intake  BMI 24.0-24.9, adult Discussed dietary and exercise modifications  Estrogen deficiency MGM & DEXA UTD  Anxiety Doing well at this time Continue to monitor  Medication management Continued  Hyperuricemia Continue allopurinol 300mg  daily No recent flares Discussed dietary modifications Continue to monitor  Palpitations EKG Monitor symptoms Work on stress reduction- exercise and relaxation techniques Go to the ER if any chest pain, shortness of breath, nausea, dizziness, severe HA, changes vision/speech   Stage 3a chronic kidney disease (HCC) Increase fluids  Avoid NSAIDS Blood pressure control Monitor sugars  Will continue to monitor  Edema bilateral lower extremities Has Bumex 2mg  PRN to take potassium wit this 60meq. Take one tablet 1  time a day as needed.   Over 40 minutes of face to face interview, exam, counseling, chart review and critical decision making was performed Future Appointments  Date Time Provider [place]  12/02/2021  2:00 PM Liane Comber, NP [facility] None  06/05/2022 11:00 AM Magda Bernheim, NP [facility] None     Plan:   During the course of the visit the patient was educated and counseled about appropriate screening and preventive services including:   Pneumococcal vaccine  Prevnar 13 Influenza vaccine Td vaccine Screening electrocardiogram Bone densitometry screening Colorectal cancer screening Diabetes screening Glaucoma screening Nutrition counseling  Advanced directives: requested   Subjective:  Katie Cox is a 65 y.o. female who presents for Medicare Annual Wellness Visit and 3 month follow up.   Numbness in both hands and does have an occasional pain in left upper chest- describes as an electrical twinge. It is very intermittent, does not feel like a crushing pain.  Does believe it can possibly be related to increased stress. Her mother and MGM both had irregular heart rate(? A fib) that occurred at age 6 and resulted in death. This makes her very anxious.  BMI is Body mass index is 24.42 kg/m., she has not been working on diet and exercise. Wt Readings from Last 3 Encounters:  06/05/21 153 lb 9.6 oz (69.7 kg)  04/15/21 154 lb 9.6 oz (70.1 kg)  03/05/21 154 lb 6.4 oz (70 kg)     She has had elevated blood pressure for  years. Her blood pressure has been controlled at home, today their BP is BP: 118/72 She does not workout. She denies chest pain, shortness of breath, dizziness.  She is not on cholesterol medication and denies  myalgias. Her cholesterol is not at goal. The cholesterol last visit was:   Lab Results  Component Value Date   CHOL 168 11/29/2020   HDL 60 11/29/2020   LDLCALC 78 11/29/2020   TRIG 198 (H) 11/29/2020   CHOLHDL 2.8 11/29/2020    She  has not been working on diet and exercise for abnormal glucose/prediabetes, and denies polydipsia, polyuria, visual disturbances, vomiting and weight loss. Last A1C in the office was:  Lab Results  Component Value Date   HGBA1C 5.2 11/29/2020   Last GFR:   Lab Results  Component Value Date   GFRNONAA 56 (L) 11/29/2020   Lab Results  Component Value Date   GFRAA 65 11/29/2020   Patient is on Vitamin D supplement.   Lab Results  Component Value Date   VD25OH 84 11/29/2020      Medication Review: Current Outpatient Medications on File Prior to Visit  Medication Sig Dispense Refill   allopurinol (ZYLOPRIM) 300 MG tablet TAKE 1 TABLET BY MOUTH  DAILY TO PREVENT GOUT 90 tablet 3   Ascorbic Acid (VITAMIN C WITH ROSE HIPS) 1000 MG tablet Take 1,000 mg by mouth daily.     aspirin 81 MG tablet Take 81 mg by mouth every other day.      bumetanide (BUMEX) 2 MG tablet TAKE 1 TABLET TWICE A DAY FOR BLOOD PRESSURE AND FLUID 180 tablet 1   famotidine (PEPCID) 40 MG tablet TAKE 1 TABLET BY MOUTH AT  BEDTIME TO PREVENT  INDIGESTION AND HEARTBURN 90 tablet 3   Magnesium 250 MG TABS Take 2 tablets by mouth 2 (two) times daily.     Multiple Vitamin (MULTIVITAMIN) tablet Take 1 tablet by mouth daily.     OVER THE COUNTER MEDICATION Takes OTC Allegra for allergies.     potassium chloride SA (KLOR-CON) 20 MEQ tablet Take  1 tablet  2 x /day  for Potassium 180 tablet 3   rosuvastatin (CRESTOR) 20 MG tablet TAKE 1 TABLET BY MOUTH  DAILY FOR CHOLESTEROL 90 tablet 3   Zinc 50 MG CAPS Take by mouth daily.     fluticasone (FLONASE) 50 MCG/ACT nasal spray Place 1 spray into both nostrils daily.     No current facility-administered medications on file prior to visit.    Allergies  Allergen Reactions   Alprazolam Other (See Comments)     Unknown reaction per pt   Lexapro [Escitalopram]     Diarrhea/nausea    Prednisone     Only high doses    Current Problems (verified) Patient Active Problem List    Diagnosis Date Noted   CKD (chronic kidney disease) stage 3, GFR 30-59 ml/min ([place]) 11/29/2020   Osteopenia 11/29/2020   Anxiety 08/23/2019   FHx: heart disease 04/21/2018   Prediabetes 04/21/2018   Abnormal glucose 02/05/2015   Hyperuricemia 08/13/2014   GERD (gastroesophageal reflux disease)    Hyperlipidemia, mixed    Essential hypertension    Vitamin D deficiency    Peripheral neuropathy    History of colon polyps 10/18/2007   Irritable bowel syndrome 10/18/2007    Screening Tests Immunization History  Administered Date(s) Administered   Influenza Inj Mdck Quad With Preservative 04/21/2018, 05/18/2019, 06/07/2020   PFIZER(Purple Top)SARS-COV-2 Vaccination 08/30/2019, 09/19/2019, 10/12/2019   PPD Test 01/25/2014, 02/05/2015, 02/27/2016, 04/21/2018, 05/18/2019   Pneumococcal Conjugate-13 08/13/2020   Pneumococcal Polysaccharide-23 11/23/2008   Td 11/29/2020   Tdap 11/29/2009   Unspecified SARS-COV-2 Vaccination 09/19/2019    Preventative care: Last colonoscopy: 09/01/19 due  2026 Last mammogram: 03/19/21 negative repeat 1 year Last pap smear/pelvic exam: 10/2014   DEXA:04/2020 (-1.6) Osteopenic Hep C: 2015: Neg  Prior vaccinations: TD or Tdap: 11/29/2020  Influenza: 06/05/21  Pneumococcal: 11/2008 Prevnar13: 08/13/20 Shingles/Zostavax: Discussed with patient  Names of Other Physician/Practitioners you currently use: 1. [facility] here for primary care 2. Eye Exam , [facility] 2022 3. Dental Exam, Dr. Lear Ng 2022 Patient Care Team: Unk Pinto, MD as PCP - General (Internal Medicine)  SURGICAL HISTORY She  has a past surgical history that includes goiter removed fro thyroid; Foot surgery; Colonoscopy; Cholecystectomy (04/14/2012); Open reduction internal fixation (orif) distal radial fracture (Right, 07/04/2015); carpel tunnel surgery (Right); Wisdom tooth extraction; Eye surgery; and Polypectomy. FAMILY HISTORY Her  family history includes Breast cancer in her maternal aunt; COPD in her paternal grandfather; Colon cancer (age of onset: 27) in her father; Colon polyps in her father; Heart disease in her mother; Hypertension in her mother; Macular degeneration in her brother. SOCIAL HISTORY She  reports that she has never smoked. She has never used smokeless tobacco. She reports that she does not drink alcohol and does not use drugs.   MEDICARE WELLNESS OBJECTIVES: Physical activity: Current Exercise Habits: The patient does not participate in regular exercise at present, Exercise limited by: None identified Cardiac risk factors: Cardiac Risk Factors include: advanced age (>65men, >92 women);hypertension;dyslipidemia;sedentary lifestyle Depression/mood screen:   Depression screen [facility] 2/9 06/05/2021  Decreased Interest 0  Down, Depressed, Hopeless 0  PHQ - 2 Score 0    ADLs:  In your present state of health, do you have any difficulty performing the following activities: 06/05/2021 06/07/2020  Hearing? N N  Vision? N N  Difficulty concentrating or making decisions? N N  Walking or climbing stairs? N N  Dressing or bathing? N N  Doing errands, shopping? N N  Preparing Food and eating ? - N  Using the Toilet? - N  In the past six months, have you accidently leaked urine? - N  Do you have problems with loss of bowel control? - N  Managing your Medications? - N  Managing your Finances? - N  Housekeeping or managing your Housekeeping? - N  Some recent data might be hidden     Cognitive Testing  Alert? Yes  Normal Appearance?Yes  Oriented to person? Yes  Place? Yes   Time? Yes  Recall of three objects?  Yes  Can perform simple calculations? Yes  Displays appropriate judgment?Yes  Can read the correct time from a watch face?Yes  EOL planning: Does Patient Have a Medical Advance Directive?: No Would patient like information on creating a medical advance directive?: Yes (MAU/Ambulatory/Procedural  Areas - Information given)  Review of Systems  Constitutional:  Negative for chills, fever and weight loss.  HENT:  Negative for congestion and hearing loss.   Eyes:  Negative for blurred vision and double vision.  Respiratory:  Negative for cough and shortness of breath.   Cardiovascular:  Negative for chest pain, palpitations, orthopnea and leg swelling.  Gastrointestinal:  Negative for abdominal pain, constipation, diarrhea, heartburn, nausea and vomiting.  Genitourinary: Negative.   Musculoskeletal:  Negative for falls, joint pain and myalgias.  Skin:  Negative for rash.  Neurological:  Negative for dizziness, tingling, tremors, loss of consciousness and headaches.  Psychiatric/Behavioral:  Negative for depression, memory loss and suicidal ideas.     Objective:     Today's Vitals   06/05/21 0959  BP: 118/72  Pulse: 63  Temp: (!) 97.3 F (36.3 C)  SpO2: 98%  Weight: 153 lb 9.6 oz (69.7 kg)   Body mass index is 24.42 kg/m.  General appearance: alert, no distress, WD/WN, female HEENT: normocephalic, sclerae anicteric, TMs pearly, nares patent, no discharge or erythema, pharynx normal Oral cavity: MMM, no lesions Neck: supple, no lymphadenopathy, no thyromegaly, no masses Heart: RRR, normal S1, S2, no murmurs Lungs: CTA bilaterally, no wheezes, rhonchi, or rales Abdomen: +bs, soft, non tender, non distended, no masses, no hepatomegaly, no splenomegaly Musculoskeletal: nontender, no swelling, no obvious deformity Extremities: no edema, no cyanosis, no clubbing Pulses: 2+ symmetric, upper and lower extremities, normal cap refill Neurological: alert, oriented x 3, CN2-12 intact, strength normal upper extremities and lower extremities, sensation normal throughout, DTRs 2+ throughout, no cerebellar signs, gait normal Psychiatric: normal affect, behavior normal, pleasant   EKG: sinus bradycardia, no ST changes   Medicare Attestation I have personally reviewed: The patient's  medical and social history Their use of alcohol, tobacco or illicit drugs Their current medications and supplements The patient's functional ability including ADLs,fall risks, home safety risks, cognitive, and hearing and visual impairment Diet and physical activities Evidence for depression or mood disorders  The patient's weight, height, BMI, and visual acuity have been recorded in the chart.  I have made referrals, counseling, and provided education to the patient based on review of the above and I have provided the patient with a written personalized care plan for preventive services.      Magda Bernheim ANP-C  [name]  06/05/2021

## 2021-06-05 ENCOUNTER — Ambulatory Visit (INDEPENDENT_AMBULATORY_CARE_PROVIDER_SITE_OTHER): Payer: Medicare Other | Admitting: Nurse Practitioner

## 2021-06-05 ENCOUNTER — Other Ambulatory Visit: Payer: Self-pay

## 2021-06-05 ENCOUNTER — Encounter: Payer: Self-pay | Admitting: Nurse Practitioner

## 2021-06-05 VITALS — BP 118/72 | HR 63 | Temp 97.3°F | Wt 153.6 lb

## 2021-06-05 DIAGNOSIS — Z6824 Body mass index (BMI) 24.0-24.9, adult: Secondary | ICD-10-CM

## 2021-06-05 DIAGNOSIS — R002 Palpitations: Secondary | ICD-10-CM

## 2021-06-05 DIAGNOSIS — I10 Essential (primary) hypertension: Secondary | ICD-10-CM

## 2021-06-05 DIAGNOSIS — Z136 Encounter for screening for cardiovascular disorders: Secondary | ICD-10-CM | POA: Diagnosis not present

## 2021-06-05 DIAGNOSIS — R6889 Other general symptoms and signs: Secondary | ICD-10-CM

## 2021-06-05 DIAGNOSIS — E559 Vitamin D deficiency, unspecified: Secondary | ICD-10-CM | POA: Diagnosis not present

## 2021-06-05 DIAGNOSIS — R600 Localized edema: Secondary | ICD-10-CM | POA: Diagnosis not present

## 2021-06-05 DIAGNOSIS — K219 Gastro-esophageal reflux disease without esophagitis: Secondary | ICD-10-CM | POA: Diagnosis not present

## 2021-06-05 DIAGNOSIS — E782 Mixed hyperlipidemia: Secondary | ICD-10-CM

## 2021-06-05 DIAGNOSIS — R7309 Other abnormal glucose: Secondary | ICD-10-CM | POA: Diagnosis not present

## 2021-06-05 DIAGNOSIS — E790 Hyperuricemia without signs of inflammatory arthritis and tophaceous disease: Secondary | ICD-10-CM | POA: Diagnosis not present

## 2021-06-05 DIAGNOSIS — Z79899 Other long term (current) drug therapy: Secondary | ICD-10-CM

## 2021-06-05 DIAGNOSIS — Z23 Encounter for immunization: Secondary | ICD-10-CM | POA: Diagnosis not present

## 2021-06-05 DIAGNOSIS — N1831 Chronic kidney disease, stage 3a: Secondary | ICD-10-CM | POA: Diagnosis not present

## 2021-06-05 DIAGNOSIS — Z0001 Encounter for general adult medical examination with abnormal findings: Secondary | ICD-10-CM | POA: Diagnosis not present

## 2021-06-05 DIAGNOSIS — Z0000 Encounter for general adult medical examination without abnormal findings: Secondary | ICD-10-CM

## 2021-06-05 DIAGNOSIS — F419 Anxiety disorder, unspecified: Secondary | ICD-10-CM

## 2021-06-05 DIAGNOSIS — E2839 Other primary ovarian failure: Secondary | ICD-10-CM

## 2021-06-05 NOTE — Patient Instructions (Signed)
Nonspecific Chest Pain, Adult °Chest pain is an uncomfortable, tight, or painful feeling in the chest. The pain can feel like a crushing, aching, or squeezing pressure. A person can feel a burning or tingling sensation. Chest pain can also be felt in your back, neck, jaw, shoulder, or arm. This pain can be worse when you move, sneeze, or take a deep breath. °Chest pain can be caused by a condition that is life-threatening. This must be treated right away. It can also be caused by something that is not life-threatening. If you have chest pain, it can be hard to know the difference, so it is important to get help right away to make sure that you do not have a serious condition. °Some life-threatening causes of chest pain include: °Heart attack. °A tear in the body's main blood vessel (aortic dissection). °Inflammation around your heart (pericarditis). °A problem in the lungs, such as a blood clot (pulmonary embolism) or a collapsed lung (pneumothorax). °Some non life-threatening causes of chest pain include: °Heartburn. °Anxiety or stress. °Damage to the bones, muscles, and cartilage that make up your chest wall. °Pneumonia or bronchitis. °Shingles infection (varicella-zoster virus). °Your chest pain may come and go. It may also be constant. Your health care provider will do tests and other studies to find the cause of your pain. Treatment will depend on the cause of your chest pain. °Follow these instructions at home: °Medicines °Take over-the-counter and prescription medicines only as told by your health care provider. °If you were prescribed an antibiotic medicine, take it as told by your health care provider. Do not stop taking the antibiotic even if you start to feel better. °Activity °Avoid any activities that cause chest pain. °Do not lift anything that is heavier than 10 lb (4.5 kg), or the limit that you are told, until your health care provider says that it is safe. °Rest as directed by your health care  provider. °Return to your normal activities only as told by your health care provider. Ask your health care provider what activities are safe for you. °Lifestyle °  °Do not use any products that contain nicotine or tobacco, such as cigarettes, e-cigarettes, and chewing tobacco. If you need help quitting, ask your health care provider. °Do not drink alcohol. °Make healthy lifestyle changes as recommended. These may include: °Getting regular exercise. Ask your health care provider to suggest some exercises that are safe for you. °Eating a heart-healthy diet. This includes plenty of fresh fruits and vegetables, whole grains, low-fat (lean) protein, and low-fat dairy products. A dietitian can help you find healthy eating options. °Maintaining a healthy weight. °Managing any other health conditions you may have, such as high blood pressure (hypertension) or diabetes. °Reducing stress, such as with yoga or relaxation techniques. °General instructions °Pay attention to any changes in your symptoms. °It is up to you to get the results of any tests that were done. Ask your health care provider, or the department that is doing the tests, when your results will be ready. °Keep all follow-up visits as told by your health care provider. This is important. °You may be asked to go for further testing if your chest pain does not go away. °Contact a health care provider if: °Your chest pain does not go away. °You feel depressed. °You have a fever. °You notice changes in your symptoms or develop new symptoms. °Get help right away if: °Your chest pain gets worse. °You have a cough that gets worse, or you cough up   blood. °You have severe pain in your abdomen. °You faint. °You have sudden, unexplained chest discomfort. °You have sudden, unexplained discomfort in your arms, back, neck, or jaw. °You have shortness of breath at any time. °You suddenly start to sweat, or your skin gets clammy. °You feel nausea or you vomit. °You suddenly  feel lightheaded or dizzy. °You have severe weakness, or unexplained weakness or fatigue. °Your heart begins to beat quickly, or it feels like it is skipping beats. °These symptoms may represent a serious problem that is an emergency. Do not wait to see if the symptoms will go away. Get medical help right away. Call your local emergency services (911 in the [place] Do not drive yourself to the hospital. °Summary °Chest pain can be caused by a condition that is serious and requires urgent treatment. It may also be caused by something that is not life-threatening. °Your health care provider may do lab tests and other studies to find the cause of your pain. °Follow your health care provider's instructions on taking medicines, making lifestyle changes, and getting emergency treatment if symptoms become worse. °Keep all follow-up visits as told by your health care provider. This includes visits for any further testing if your chest pain does not go away. °This information is not intended to replace advice given to you by your health care provider. Make sure you discuss any questions you have with your health care provider. °Document Revised: 08/23/2020 Document Reviewed: 08/23/2020 °Elsevier Patient Education © 2022 [employer] ° °

## 2021-06-06 ENCOUNTER — Encounter: Payer: Self-pay | Admitting: Nurse Practitioner

## 2021-06-06 LAB — COMPLETE METABOLIC PANEL WITH GFR
AG Ratio: 2.1 (calc) (ref 1.0–2.5)
ALT: 27 U/L (ref 6–29)
AST: 35 U/L (ref 10–35)
Albumin: 4.8 g/dL (ref 3.6–5.1)
Alkaline phosphatase (APISO): 77 U/L (ref 37–153)
BUN/Creatinine Ratio: 20 (calc) (ref 6–22)
BUN: 23 mg/dL (ref 7–25)
CO2: 33 mmol/L — ABNORMAL HIGH (ref 20–32)
Calcium: 10.7 mg/dL — ABNORMAL HIGH (ref 8.6–10.4)
Chloride: 99 mmol/L (ref 98–110)
Creat: 1.13 mg/dL — ABNORMAL HIGH (ref 0.50–1.05)
Globulin: 2.3 g/dL (calc) (ref 1.9–3.7)
Glucose, Bld: 81 mg/dL (ref 65–99)
Potassium: 4 mmol/L (ref 3.5–5.3)
Sodium: 142 mmol/L (ref 135–146)
Total Bilirubin: 1.1 mg/dL (ref 0.2–1.2)
Total Protein: 7.1 g/dL (ref 6.1–8.1)
eGFR: 54 mL/min/{1.73_m2} — ABNORMAL LOW (ref >=60)

## 2021-06-06 LAB — CBC WITH DIFFERENTIAL/PLATELET
Absolute Monocytes: 610 cells/uL (ref 200–950)
Basophils Absolute: 73 cells/uL (ref 0–200)
Basophils Relative: 0.8 %
Eosinophils Absolute: 118 cells/uL (ref 15–500)
Eosinophils Relative: 1.3 %
HCT: 42.5 % (ref 35.0–45.0)
Hemoglobin: 14.5 g/dL (ref 11.7–15.5)
Lymphs Abs: 3294 cells/uL (ref 850–3900)
MCH: 33 pg (ref 27.0–33.0)
MCHC: 34.1 g/dL (ref 32.0–36.0)
MCV: 96.8 fL (ref 80.0–100.0)
MPV: 13.5 fL — ABNORMAL HIGH (ref 7.5–12.5)
Monocytes Relative: 6.7 %
Neutro Abs: 5005 cells/uL (ref 1500–7800)
Neutrophils Relative %: 55 %
Platelets: 281 10*3/uL (ref 140–400)
RBC: 4.39 10*6/uL (ref 3.80–5.10)
RDW: 12.4 % (ref 11.0–15.0)
Total Lymphocyte: 36.2 %
WBC: 9.1 10*3/uL (ref 3.8–10.8)

## 2021-06-06 LAB — HEMOGLOBIN A1C
Hgb A1c MFr Bld: 5.3 % of total Hgb (ref <5.7)
Mean Plasma Glucose: 105 mg/dL
eAG (mmol/L): 5.8 mmol/L

## 2021-06-06 LAB — LIPID PANEL
Cholesterol: 150 mg/dL (ref <200)
HDL: 61 mg/dL (ref >=50)
LDL Cholesterol (Calc): 67 mg/dL (calc)
Non-HDL Cholesterol (Calc): 89 mg/dL (calc) (ref <130)
Total CHOL/HDL Ratio: 2.5 (calc) (ref <5.0)
Triglycerides: 141 mg/dL (ref <150)

## 2021-06-10 ENCOUNTER — Encounter: Payer: Self-pay | Admitting: Adult Health Nurse Practitioner

## 2021-06-12 ENCOUNTER — Ambulatory Visit: Payer: Medicare Other | Admitting: Adult Health

## 2021-06-12 ENCOUNTER — Ambulatory Visit: Payer: Medicare Other | Admitting: Adult Health Nurse Practitioner

## 2021-06-13 ENCOUNTER — Ambulatory Visit: Payer: Medicare Other | Admitting: Nurse Practitioner

## 2021-06-20 ENCOUNTER — Other Ambulatory Visit: Payer: Self-pay | Admitting: Adult Health

## 2021-06-20 DIAGNOSIS — E782 Mixed hyperlipidemia: Secondary | ICD-10-CM

## 2021-07-04 ENCOUNTER — Encounter (HOSPITAL_BASED_OUTPATIENT_CLINIC_OR_DEPARTMENT_OTHER): Payer: Self-pay

## 2021-07-04 ENCOUNTER — Encounter: Payer: Self-pay | Admitting: Emergency Medicine

## 2021-07-04 ENCOUNTER — Other Ambulatory Visit: Payer: Self-pay

## 2021-07-04 ENCOUNTER — Emergency Department (HOSPITAL_BASED_OUTPATIENT_CLINIC_OR_DEPARTMENT_OTHER)
Admission: EM | Admit: 2021-07-04 | Discharge: 2021-07-04 | Disposition: A | Discharge status: Home / Self Care | Payer: Medicare Other | Attending: Emergency Medicine | Admitting: Emergency Medicine

## 2021-07-04 ENCOUNTER — Emergency Department (HOSPITAL_BASED_OUTPATIENT_CLINIC_OR_DEPARTMENT_OTHER): Payer: Medicare Other | Admitting: Radiology

## 2021-07-04 ENCOUNTER — Ambulatory Visit
Admission: EM | Admit: 2021-07-04 | Discharge: 2021-07-04 | Disposition: A | Discharge status: Home / Self Care | Payer: Medicare Other | Attending: Internal Medicine | Admitting: Internal Medicine

## 2021-07-04 ENCOUNTER — Emergency Department (HOSPITAL_BASED_OUTPATIENT_CLINIC_OR_DEPARTMENT_OTHER): Payer: Medicare Other

## 2021-07-04 DIAGNOSIS — I129 Hypertensive chronic kidney disease with stage 1 through stage 4 chronic kidney disease, or unspecified chronic kidney disease: Secondary | ICD-10-CM | POA: Diagnosis not present

## 2021-07-04 DIAGNOSIS — Z79899 Other long term (current) drug therapy: Secondary | ICD-10-CM | POA: Diagnosis not present

## 2021-07-04 DIAGNOSIS — R0789 Other chest pain: Secondary | ICD-10-CM

## 2021-07-04 DIAGNOSIS — R7989 Other specified abnormal findings of blood chemistry: Secondary | ICD-10-CM | POA: Diagnosis not present

## 2021-07-04 DIAGNOSIS — N183 Chronic kidney disease, stage 3 unspecified: Secondary | ICD-10-CM | POA: Diagnosis not present

## 2021-07-04 DIAGNOSIS — R079 Chest pain, unspecified: Secondary | ICD-10-CM | POA: Diagnosis not present

## 2021-07-04 LAB — CBC
HCT: 42.5 % (ref 36.0–46.0)
Hemoglobin: 14.7 g/dL (ref 12.0–15.0)
MCH: 32.4 pg (ref 26.0–34.0)
MCHC: 34.6 g/dL (ref 30.0–36.0)
MCV: 93.6 fL (ref 80.0–100.0)
Platelets: 324 10*3/uL (ref 150–400)
RBC: 4.54 MIL/uL (ref 3.87–5.11)
RDW: 12.3 % (ref 11.5–15.5)
WBC: 9.1 10*3/uL (ref 4.0–10.5)
nRBC: 0 % (ref 0.0–0.2)

## 2021-07-04 LAB — BASIC METABOLIC PANEL
Anion gap: 13 (ref 5–15)
BUN: 24 mg/dL — ABNORMAL HIGH (ref 8–23)
CO2: 29 mmol/L (ref 22–32)
Calcium: 10.6 mg/dL — ABNORMAL HIGH (ref 8.9–10.3)
Chloride: 101 mmol/L (ref 98–111)
Creatinine, Ser: 1.03 mg/dL — ABNORMAL HIGH (ref 0.44–1.00)
GFR, Estimated: 60 mL/min — ABNORMAL LOW (ref >60)
Glucose, Bld: 90 mg/dL (ref 70–99)
Potassium: 3.9 mmol/L (ref 3.5–5.1)
Sodium: 143 mmol/L (ref 135–145)

## 2021-07-04 LAB — TROPONIN I (HIGH SENSITIVITY)
Troponin I (High Sensitivity): 6 ng/L (ref <18)
Troponin I (High Sensitivity): 7 ng/L (ref <18)

## 2021-07-04 LAB — D-DIMER, QUANTITATIVE: D-Dimer, Quant: 0.66 ug/mL-FEU — ABNORMAL HIGH (ref 0.00–0.50)

## 2021-07-04 MED ORDER — ALUM & MAG HYDROXIDE-SIMETH 200-200-20 MG/5ML PO SUSP
30.0000 mL | Freq: Once | ORAL | Status: AC
  Administered 2021-07-04: 30 mL via ORAL
  Filled 2021-07-04: qty 30

## 2021-07-04 MED ORDER — LIDOCAINE VISCOUS HCL 2 % MT SOLN
15.0000 mL | Freq: Once | OROMUCOSAL | Status: AC
  Administered 2021-07-04: 15 mL via ORAL
  Filled 2021-07-04: qty 15

## 2021-07-04 MED ORDER — KETOROLAC TROMETHAMINE 15 MG/ML IJ SOLN
15.0000 mg | Freq: Once | INTRAMUSCULAR | Status: AC
  Administered 2021-07-04: 15 mg via INTRAVENOUS
  Filled 2021-07-04: qty 1

## 2021-07-04 MED ORDER — LIDOCAINE 5 % EX PTCH
1.0000 | MEDICATED_PATCH | CUTANEOUS | Status: DC
  Filled 2021-07-04: qty 1

## 2021-07-04 MED ORDER — IOHEXOL 350 MG/ML SOLN
80.0000 mL | Freq: Once | INTRAVENOUS | Status: AC | PRN
  Administered 2021-07-04: 80 mL via INTRAVENOUS

## 2021-07-04 MED ORDER — FAMOTIDINE IN NACL 20-0.9 MG/50ML-% IV SOLN
20.0000 mg | Freq: Once | INTRAVENOUS | Status: AC
  Administered 2021-07-04: 20 mg via INTRAVENOUS
  Filled 2021-07-04: qty 50

## 2021-07-04 MED ORDER — LIDOCAINE 5 % EX PTCH: 1.0000 | MEDICATED_PATCH | Freq: Every day | CUTANEOUS | 0 refills | Status: DC | PRN

## 2021-07-04 MED ORDER — SODIUM CHLORIDE 0.9 % IV BOLUS
1000.0000 mL | Freq: Once | INTRAVENOUS | Status: AC
  Administered 2021-07-04: 1000 mL via INTRAVENOUS

## 2021-07-04 NOTE — ED Notes (Signed)
While awaiting family will continue to monitor patient in room one

## 2021-07-04 NOTE — ED Notes (Signed)
Patient transported to MRI 

## 2021-07-04 NOTE — ED Provider Notes (Signed)
[facility] DE[facility] Provider Note   CSN: [id number] Arrival date & time: 07/04/21  1328     History  Chief Complaint  Patient presents with   Chest Pain    Katie Cox is a 66 y.o. female.  This is a 66 y.o. female with significant medical history as below, including CKD, acid reflux, HLD who presents to the ED with complaint of left-sided chest pain x6 months.  Pain described as sharp, pinpoint, localized to left side chest wall.  Intermittent.  No identified alleviating chest pain factors.  Patient feels the pain mildly worsened last night, she took antacid medication which did not significantly factor discomfort.  Pain is improved since yesterday evening.  No associated dyspnea, diaphoresis, lightheadedness, nausea or headaches.  No rashes.  No falls.  She was seen urgent care earlier today who advised her to reports the ER for evaluation of her chest pain.  She is currently having minimal discomfort at this time.  Pain remains localized to her left-sided chest wall, pinpoint.  Nonradiating.  Not associate with exertion.  No dyspnea.    She has not seen cardiologist in the past.  Non-smoker  The history is provided by the patient and a relative. No language interpreter was used.  Chest Pain Associated symptoms: no abdominal pain, no cough, no dysphagia, no fever, no headache, no nausea, no palpitations, no shortness of breath and no vomiting      Patient Active Problem List   Diagnosis Date Noted   CKD (chronic kidney disease) stage 3, GFR 30-59 ml/min (HCC) 11/29/2020   Osteopenia 11/29/2020   Anxiety 08/23/2019   FHx: heart disease 04/21/2018   Prediabetes 04/21/2018   Abnormal glucose 02/05/2015   Hyperuricemia 08/13/2014   GERD (gastroesophageal reflux disease)    Hyperlipidemia, mixed    Essential hypertension    Vitamin D deficiency    Peripheral neuropathy    History of colon polyps 10/18/2007   Irritable bowel syndrome 10/18/2007      Home Medications Prior to Admission medications   Medication Sig Start Date End Date Taking? Authorizing Provider  lidocaine (LIDODERM) 5 % Place 1 patch onto the skin daily as needed for up to 15 doses. Remove & Discard patch within 12 hours or as directed by MD 07/04/21  Yes Wynona Dove A, DO  allopurinol (ZYLOPRIM) 300 MG tablet TAKE 1 TABLET BY MOUTH  DAILY TO PREVENT GOUT 08/27/20   Liane Comber, NP  Ascorbic Acid (VITAMIN C WITH ROSE HIPS) 1000 MG tablet Take 1,000 mg by mouth daily.    [provider]  aspirin 81 MG tablet Take 81 mg by mouth every other day.     [provider]  bumetanide (BUMEX) 2 MG tablet TAKE 1 TABLET TWICE A DAY FOR BLOOD PRESSURE AND FLUID 04/15/21   Magda Bernheim, NP  famotidine (PEPCID) 40 MG tablet TAKE 1 TABLET BY MOUTH AT  BEDTIME TO PREVENT  INDIGESTION AND HEARTBURN 08/25/20   Liane Comber, NP  fluticasone (FLONASE) 50 MCG/ACT nasal spray Place 1 spray into both nostrils daily. 04/14/17 11/29/20  Unk Pinto, MD  Magnesium 250 MG TABS Take 2 tablets by mouth 2 (two) times daily.    [provider]  Multiple Vitamin (MULTIVITAMIN) tablet Take 1 tablet by mouth daily.    [provider]  OVER THE COUNTER MEDICATION Takes OTC Allegra for allergies.    [provider]  potassium chloride SA (KLOR-CON) 20 MEQ tablet Take  1 tablet  2 x /day  for Potassium 10/08/20   Unk Pinto, MD  rosuvastatin (CRESTOR) 20 MG tablet Take  1 tablet   Daily  for  Cholesterol                                                            /                            TAKE BY MOUTH 06/20/21   Unk Pinto, MD  Zinc 50 MG CAPS Take by mouth daily.    [provider]      Allergies    Alprazolam, Lexapro [escitalopram], and Prednisone    Review of Systems   Review of Systems  Constitutional:  Negative for chills and fever.  HENT:  Negative for facial swelling and trouble swallowing.   Eyes:  Negative for  photophobia and visual disturbance.  Respiratory:  Negative for cough and shortness of breath.   Cardiovascular:  Positive for chest pain. Negative for palpitations.  Gastrointestinal:  Negative for abdominal pain, nausea and vomiting.  Endocrine: Negative for polydipsia and polyuria.  Genitourinary:  Negative for difficulty urinating and hematuria.  Musculoskeletal:  Negative for gait problem and joint swelling.  Skin:  Negative for pallor and rash.  Neurological:  Negative for syncope and headaches.  Psychiatric/Behavioral:  Negative for agitation and confusion.    Physical Exam Updated Vital Signs BP 139/75    Pulse 61    Temp 97.7 F (36.5 C) (Oral)    Resp 19    Ht 5' (1.524 m)    Wt 69.7 kg    SpO2 99%    BMI 30.01 kg/m  Physical Exam Vitals and nursing note reviewed.  Constitutional:      General: She is not in acute distress.    Appearance: Normal appearance. She is well-developed.  HENT:     Head: Normocephalic and atraumatic.     Right Ear: External ear normal.     Left Ear: External ear normal.     Nose: Nose normal.     Mouth/Throat:     Mouth: Mucous membranes are moist.  Eyes:     General: No scleral icterus.       Right eye: No discharge.        Left eye: No discharge.  Cardiovascular:     Rate and Rhythm: Normal rate and regular rhythm.     Pulses: Normal pulses.     Heart sounds: Normal heart sounds. No murmur heard. Pulmonary:     Effort: Pulmonary effort is normal. No tachypnea or respiratory distress.     Breath sounds: Normal breath sounds. No decreased breath sounds or wheezing.  Abdominal:     General: Abdomen is flat.     Palpations: Abdomen is soft.     Tenderness: There is no abdominal tenderness.  Musculoskeletal:        General: Normal range of motion.     Cervical back: Normal range of motion.     Right lower leg: No edema.     Left lower leg: No edema.  Skin:    General: Skin is warm and dry.     Capillary Refill: Capillary refill takes  less than 2 seconds.  Neurological:  Mental Status: She is alert.  Psychiatric:        Mood and Affect: Mood normal.        Behavior: Behavior normal.    ED Results / Procedures / Treatments   Labs (all labs ordered are listed, but only abnormal results are displayed) Labs Reviewed  BASIC METABOLIC PANEL - Abnormal; Notable for the following components:      Result Value   BUN 24 (*)    Creatinine, Ser 1.03 (*)    Calcium 10.6 (*)    GFR, Estimated 60 (*)    All other components within normal limits  D-DIMER, QUANTITATIVE - Abnormal; Notable for the following components:   D-Dimer, Quant 0.66 (*)    All other components within normal limits  CBC  TROPONIN I (HIGH SENSITIVITY)  TROPONIN I (HIGH SENSITIVITY)    EKG EKG Interpretation  Date/Time:  Thursday July 04 2021 13:36:16 EST Ventricular Rate:  57 PR Interval:  156 QRS Duration: 82 QT Interval:  472 QTC Calculation: 459 R Axis:   26 Text Interpretation: Sinus bradycardia Otherwise normal ECG When compared with ECG of 04-Jul-2021 12:26, No significant change was found Confirmed by Wynona Dove ([id number]) on 07/04/2021 4:14:28 PM  Radiology DG Chest 2 View  Result Date: 07/04/2021 CLINICAL DATA:  CP EXAM: CHEST - 2 VIEW COMPARISON:  None. FINDINGS: The cardiomediastinal silhouette is within normal limits. No pleural effusion. No pneumothorax. No mass or consolidation. No acute osseous abnormality. Surgical clip overlying the right neck. IMPRESSION: No acute findings in the chest. Electronically Signed   By: Albin Felling M.D.   On: 07/04/2021 14:13   CT Angio Chest PE W and/or Wo Contrast  Result Date: 07/04/2021 CLINICAL DATA:  Pulmonary embolism suspected, positive D-dimer, chest pain for 6-7 months EXAM: CT ANGIOGRAPHY CHEST WITH CONTRAST TECHNIQUE: Multidetector CT imaging of the chest was performed using the standard protocol during bolus administration of intravenous contrast. Multiplanar CT image reconstructions  and MIPs were obtained to evaluate the vascular anatomy. RADIATION DOSE REDUCTION: This exam was performed according to the departmental dose-optimization program which includes automated exposure control, adjustment of the mA and/or kV according to patient size and/or use of iterative reconstruction technique. CONTRAST:  13mL OMNIPAQUE IOHEXOL 350 MG/ML SOLN COMPARISON:  None. FINDINGS: Cardiovascular: Satisfactory opacification of the pulmonary arteries to the segmental level. No evidence of pulmonary embolism. Normal heart size. No pericardial effusion. No reflux into the hepatic veins. Mediastinum/Nodes: No enlarged mediastinal, hilar, or axillary lymph nodes. Postsurgical changes in the right thyroid. Trachea and and esophagus demonstrate no significant findings. Lungs/Pleura: Lungs are clear. No pleural effusion or pneumothorax. Upper Abdomen: No acute abnormality. Prior cholecystectomy. Vascular calcifications. Musculoskeletal: No chest wall abnormality. No acute or significant osseous findings. Review of the MIP images confirms the above findings. IMPRESSION: No evidence of pulmonary embolism.  No acute process in the chest. Electronically Signed   By: Merilyn Baba M.D.   On: 07/04/2021 18:18    Procedures Procedures    Medications Ordered in ED Medications  lidocaine (LIDODERM) 5 % 1 patch (0 patches Transdermal Hold 07/04/21 1647)  famotidine (PEPCID) IVPB 20 mg premix (0 mg Intravenous Stopped 07/04/21 1828)  alum & mag hydroxide-simeth (MAALOX/MYLANTA) 200-200-20 MG/5ML suspension 30 mL (30 mLs Oral Given 07/04/21 1637)    And  lidocaine (XYLOCAINE) 2 % viscous mouth solution 15 mL (15 mLs Oral Given 07/04/21 1637)  ketorolac (TORADOL) 15 MG/ML injection 15 mg (15 mg Intravenous Given 07/04/21 1639)  sodium chloride  0.9 % bolus 1,000 mL (0 mLs Intravenous Stopped 07/04/21 1828)  iohexol (OMNIPAQUE) 350 MG/ML injection 80 mL (80 mLs Intravenous Contrast Given 07/04/21 1756)    ED Course/  Medical Decision Making/ A&P                           Medical Decision Making   CC: cp  This patient complains of above; this involves an extensive number of treatment options and is a complaint that carries with it a high risk of complications and morbidity. Vital signs were reviewed. Serious etiologies considered.  Record review:  Previous records obtained and reviewed   Additional history obtained from family at bedside.   Work up as above, notable for:  Labs & imaging results that were available during my care of the patient were reviewed by me and considered in my medical decision making.   I ordered imaging studies which included CXR and I independently visualized and interpreted imaging which showed no acute abnormalities.   Management: Given GI cocktail, toradol. Lidoderm patch, IVF   Reassessment:  Patient reassessed, chest pain has resolved following intervention including GI cocktail  Low risk Wells score, dimer was mildly elevated, CT was obtained which does not demonstrate acute PE.   The patient's chest pain is not suggestive of pulmonary embolus, cardiac ischemia, aortic dissection, pericarditis, myocarditis, pneumothorax, pneumonia, Zoster, or esophageal perforation, or other serious etiology.  Historically not abrupt in onset, tearing or ripping, pulses symmetric. EKG nonspecific for ischemia/infarction. No dysrhythmias, brugada, WPW, prolonged QT noted. Troponin negative x2. CXR reviewed. Labs without demonstration of acute pathology unless otherwise noted above. Low HEART Score: 0-3 points (0.9-1.7% risk of MACE). Given the extremely low risk of these diagnoses further testing and evaluation for these possibilities does not appear to be indicated at this time. Patient in no distress and overall condition improved here in the ED. Detailed discussions were had with the patient regarding current findings, and need for close f/u with PCP or on call doctor. The patient  has been instructed to return immediately if the symptoms worsen in any way for re-evaluation. Patient verbalized understanding and is in agreement with current care plan. All questions answered prior to discharge.    Cardiac monitoring reviewed by myself and interpreted, normal sinus rhythm.     This chart was dictated using voice recognition software.  Despite best efforts to proofread,  errors can occur which can change the documentation meaning.  Final Clinical Impression(s) / ED Diagnoses Final diagnoses:  Atypical chest pain    Rx / DC Orders ED Discharge Orders          Ordered    lidocaine (LIDODERM) 5 %  Daily PRN        07/04/21 1902              Jeanell Sparrow, DO 07/04/21 1902

## 2021-07-04 NOTE — ED Triage Notes (Signed)
Patient here POV from Home with CP.  CP has been present for 6-7 months. Pain is intermittent but CP began last PM and has not subsided and is worse in nature.  No N/V/D. No SOB. No Other Symptoms.   NAD Noted during Triage. A&Ox4. GCS 15. Ambulatory.

## 2021-07-04 NOTE — ED Notes (Signed)
Patient transported to CT 

## 2021-07-04 NOTE — ED Notes (Signed)
Daughter is here to pick up patient

## 2021-07-04 NOTE — ED Notes (Signed)
Patient is being discharged from the Urgent Care and sent to the Emergency Department via POV due to patient refusing EMS and her daughter telling her to wait for her to come pick her up, daughter is 45 minutes out. Per Ewell Poe PA, patient is in need of higher level of care due to chest pain, bradycardia. Patient is aware and verbalizes understanding of plan of care.  Vitals:   07/04/21 1220  BP: 136/82  Pulse: (!) 54  Resp: 16  Temp: (!) 97.4 F (36.3 C)  SpO2: 96%

## 2021-07-04 NOTE — ED Triage Notes (Signed)
Chest pain intermittently since December. Started getting worse last night, took some medication for indigestion without improvement.

## 2021-07-04 NOTE — ED Provider Notes (Signed)
[facility]    CSN: [id number] Arrival date & time: 07/04/21  1211      History   Chief Complaint No chief complaint on file.   HPI Katie Cox is a 66 y.o. female.   Here today for evaluation of chest pain that started in December and has been intermittent since that time.  She reports that last night she started to have worsening pain.  She reports that pain is substernal into the left of her chest.  She denies any shortness of breath.  She did take some medication for indigestion and this was not helpful. She does note that her blood pressure was higher today than it typically is- was 144/82 when she woke this morning-- typically she reports her systolic BP is around 333.  The history is provided by the patient.   Past Medical History:  Diagnosis Date   Abnormal glucose 02/05/2015   Allergy    seasonal   Contact lens/glasses fitting    wears contacts or glasses   GERD (gastroesophageal reflux disease)    Hyperlipidemia    Hypertension    Hyperuricemia 08/13/2014   Irritable bowel syndrome 10/18/2007   Irritable bowel syndrome (IBS)    Medication management 12/26/2013   Peripheral neuropathy    in feet   POLYP, COLON 10/18/2007   PONV (postoperative nausea and vomiting)    nausea, slow to wake up at times   Vitamin D deficiency     Patient Active Problem List   Diagnosis Date Noted   CKD (chronic kidney disease) stage 3, GFR 30-59 ml/min ([facility]) 11/29/2020   Osteopenia 11/29/2020   Anxiety 08/23/2019   FHx: heart disease 04/21/2018   Prediabetes 04/21/2018   Abnormal glucose 02/05/2015   Hyperuricemia 08/13/2014   GERD (gastroesophageal reflux disease)    Hyperlipidemia, mixed    Essential hypertension    Vitamin D deficiency    Peripheral neuropathy    History of colon polyps 10/18/2007   Irritable bowel syndrome 10/18/2007    Past Surgical History:  Procedure Laterality Date   carpel tunnel surgery Right    CHOLECYSTECTOMY  04/14/2012    Procedure: LAPAROSCOPIC CHOLECYSTECTOMY;  Surgeon: Rolm Bookbinder, MD;  Location: [facility];  Service: General;  Laterality: N/A;   COLONOSCOPY     EYE SURGERY     at age 19yr   FOOT SURGERY     both feet - little toes on each foot   goiter removed fro thyroid     OPEN REDUCTION INTERNAL FIXATION (ORIF) DISTAL RADIAL FRACTURE Right 07/04/2015   Procedure: OPEN REDUCTION INTERNAL FIXATION (ORIF) RIGHT DISTAL RADIUS FRACTURE;  Surgeon: Iran Planas, MD;  Location: [facility];  Service: Orthopedics;  Laterality: Right;   POLYPECTOMY     WISDOM TOOTH EXTRACTION      OB History   No obstetric history on file.      Home Medications    Prior to Admission medications   Medication Sig Start Date End Date Taking? Authorizing Provider  allopurinol (ZYLOPRIM) 300 MG tablet TAKE 1 TABLET BY MOUTH  DAILY TO PREVENT GOUT 08/27/20   Liane Comber, NP  Ascorbic Acid (VITAMIN C WITH ROSE HIPS) 1000 MG tablet Take 1,000 mg by mouth daily.    [provider]  aspirin 81 MG tablet Take 81 mg by mouth every other day.     [provider]  bumetanide (BUMEX) 2 MG tablet TAKE 1 TABLET TWICE A DAY FOR BLOOD PRESSURE AND FLUID 04/15/21  Magda Bernheim, NP  famotidine (PEPCID) 40 MG tablet TAKE 1 TABLET BY MOUTH AT  BEDTIME TO PREVENT  INDIGESTION AND HEARTBURN 08/25/20   Liane Comber, NP  fluticasone (FLONASE) 50 MCG/ACT nasal spray Place 1 spray into both nostrils daily. 04/14/17 11/29/20  Unk Pinto, MD  Magnesium 250 MG TABS Take 2 tablets by mouth 2 (two) times daily.    [provider]  Multiple Vitamin (MULTIVITAMIN) tablet Take 1 tablet by mouth daily.    [provider]  OVER THE COUNTER MEDICATION Takes OTC Allegra for allergies.    [provider]  potassium chloride SA (KLOR-CON) 20 MEQ tablet Take  1 tablet  2 x /day  for Potassium 10/08/20   Unk Pinto, MD  rosuvastatin (CRESTOR) 20 MG tablet Take  1 tablet   Daily  for   Cholesterol                                                            /                            TAKE BY MOUTH 06/20/21   Unk Pinto, MD  Zinc 50 MG CAPS Take by mouth daily.    [provider]    Family History Family History  Problem Relation Age of Onset   Hypertension Mother    Heart disease Mother    Colon cancer Father 14   Colon polyps Father    Macular degeneration Brother    COPD Paternal Grandfather    Breast cancer Maternal Aunt    Esophageal cancer Neg Hx    Rectal cancer Neg Hx    Stomach cancer Neg Hx     Social History Social History   Tobacco Use   Smoking status: Never   Smokeless tobacco: Never  Vaping Use   Vaping Use: Never used  Substance Use Topics   Alcohol use: No   Drug use: No     Allergies   Alprazolam, Lexapro [escitalopram], and Prednisone   Review of Systems Review of Systems  Constitutional:  Negative for chills and fever.  Eyes:  Negative for discharge and redness.  Respiratory:  Negative for shortness of breath.   Cardiovascular:  Positive for chest pain.  Gastrointestinal:  Negative for nausea and vomiting.    Physical Exam Triage Vital Signs ED Triage Vitals  Enc Vitals Group     BP 07/04/21 1220 136/82     Pulse Rate 07/04/21 1220 (!) 54     Resp 07/04/21 1220 16     Temp 07/04/21 1220 (!) 97.4 F (36.3 C)     Temp Source 07/04/21 1220 Oral     SpO2 07/04/21 1220 96 %     Weight --      Height --      Head Circumference --      Peak Flow --      Pain Score 07/04/21 1221 5     Pain Loc --      Pain Edu? --      Excl. in [place]? --    No data found.  Updated Vital Signs BP 136/82 (BP Location: Left Arm)    Pulse (!) 54    Temp (!) 97.4 F (  36.3 C) (Oral)    Resp 16    SpO2 96%       Physical Exam Vitals and nursing note reviewed.  Constitutional:      General: She is not in acute distress.    Appearance: Normal appearance. She is not ill-appearing.  HENT:     Head: Normocephalic and  atraumatic.  Eyes:     Conjunctiva/sclera: Conjunctivae normal.  Cardiovascular:     Rate and Rhythm: Regular rhythm. Bradycardia present.     Heart sounds: Normal heart sounds. No murmur heard. Pulmonary:     Effort: Pulmonary effort is normal. No respiratory distress.     Breath sounds: Normal breath sounds. No wheezing, rhonchi or rales.  Neurological:     Mental Status: She is alert.  Psychiatric:        Mood and Affect: Mood normal.        Behavior: Behavior normal.     UC Treatments / Results  Labs (all labs ordered are listed, but only abnormal results are displayed) Labs Reviewed - No data to display  EKG   Radiology No results found.  Procedures Procedures (including critical care time)  Medications Ordered in UC Medications - No data to display  Initial Impression / Assessment and Plan / UC Course  I have reviewed the triage vital signs and the nursing notes.  Pertinent labs & imaging results that were available during my care of the patient were reviewed by me and considered in my medical decision making (see chart for details).    Recommended further evaluation in the ED as I suspect she will need stat labs, specifically troponins to rule out cardiac cause of symptoms. EKG with bradycardia in office which is not abnormal for patient as of December of last year. EMS notified for transport as she has no one else to drive her to the ED for evaluation. Patient expresses understanding. Patient then gets on the phone with her daughter in law who is a nurse who convinces her she does not need to go by EMS and that someone from her family will come to get her for transport. Discussed with  daughter in law concerns for possible NSTEMI vs other cardiac etiology of symptoms and concerns for waiting for transport. Ultimately patient decides to wait for family member AMA.   Final Clinical Impressions(s) / UC Diagnoses   Final diagnoses:  Chest discomfort   Discharge  Instructions   None    ED Prescriptions   None    PDMP not reviewed this encounter.   Francene Finders, PA-C 07/04/21 1252

## 2021-07-11 ENCOUNTER — Encounter: Payer: Self-pay | Admitting: Adult Health

## 2021-07-11 ENCOUNTER — Other Ambulatory Visit: Payer: Self-pay

## 2021-07-11 ENCOUNTER — Ambulatory Visit (INDEPENDENT_AMBULATORY_CARE_PROVIDER_SITE_OTHER): Payer: Medicare Other | Admitting: Adult Health

## 2021-07-11 VITALS — BP 136/70 | HR 68 | Temp 97.9°F | Resp 17 | Wt 155.0 lb

## 2021-07-11 DIAGNOSIS — R079 Chest pain, unspecified: Secondary | ICD-10-CM | POA: Diagnosis not present

## 2021-07-11 DIAGNOSIS — F419 Anxiety disorder, unspecified: Secondary | ICD-10-CM

## 2021-07-11 MED ORDER — BUSPIRONE HCL 5 MG PO TABS: ORAL_TABLET | ORAL | 0 refills | Status: DC

## 2021-07-11 NOTE — Progress Notes (Signed)
Assessment and Plan:  Katie Cox was seen today for follow-up.  Diagnoses and all orders for this visit:  Nonspecific chest pain Low concern for cardiac based on her description, recent ED workup, very low risk personal and family hx Discussed non-emergent non-cardiac/lung etiologies GERD - continue famotidine, can try mylanta/maalox if has recurrent, if this helps immediately would likely indicate reflux MSK - costochondritis or other chest wall pain - if can cause/trigger pain with arm movement or pressing chest wall, then MSK, NOT heart or lung - can try ice, NSAIDs, salon pas, etc Anxiety - declines daily agent, given buspar PRN to try after discussion She is reassure by our discussion today, can follow up if needed.   Anxiety -     busPIRone (BUSPAR) 5 MG tablet; Take 1 tab up to three times a day as needed for anxiety.   Further disposition pending results of labs. Discussed med's effects and SE's.   Over 30 minutes of exam, counseling, chart review, and critical decision making was performed.   Future Appointments  Date Time Provider [place]  09/09/2021  3:30 PM Unk Pinto, MD [facility] None  12/11/2021  3:00 PM Liane Comber, NP [facility] None  06/05/2022 11:00 AM Magda Bernheim, NP [facility] None    ------------------------------------------------------------------------------------------------------------------   HPI BP 136/70    Pulse 68    Temp 97.9 F (36.6 C)    Resp 17    Wt 155 lb (70.3 kg)    SpO2 98%    BMI 30.27 kg/m  66 y.o.female presents for ED follow up for chest pain.   She was seen in ED 07/04/2021 for chest pain. She reprots pain is currently resolved.   She reports will have intermittent chest for several months, on left side. Doesn't correlate with exertion, position, movement, oral intake. She denies any palpitations, neck/shoulder/jaw pain, dyspnea, belching, bloating, nausea, dizziness, fatigue. She does not have family hx of MI/CVD.  She denies recreational drug use.   In the ED she had a thorough workup including negative EKG, troponin x 2, CXR, CTA, CBC, BMP were negative. She did not have rash.   She was prescribed 5% lidocaine patch at that time but $41 at pharmacy, currently without pain and hasn't picked up. She did start taking famotidine 40 mg more regulalry per their recommendation and feels this may have helped. Denies chest tenderness or being able to trigger pain with arm movement. She admits to some stress in life but denies severe anxiety, has tried lexapro 10 mg and didn't tolerate well (diarrhea), prefers to avoid a new daily med.   Past Medical History:  Diagnosis Date   Abnormal glucose 02/05/2015   Allergy    seasonal   Contact lens/glasses fitting    wears contacts or glasses   GERD (gastroesophageal reflux disease)    Hyperlipidemia    Hypertension    Hyperuricemia 08/13/2014   Irritable bowel syndrome 10/18/2007   Irritable bowel syndrome (IBS)    Medication management 12/26/2013   Peripheral neuropathy    in feet   POLYP, COLON 10/18/2007   PONV (postoperative nausea and vomiting)    nausea, slow to wake up at times   Vitamin D deficiency      Allergies  Allergen Reactions   Alprazolam Other (See Comments)     Unknown reaction per pt   Lexapro [Escitalopram]     Diarrhea/nausea    Prednisone     Only high doses    Current Outpatient Medications on File  Prior to Visit  Medication Sig   allopurinol (ZYLOPRIM) 300 MG tablet TAKE 1 TABLET BY MOUTH  DAILY TO PREVENT GOUT   Ascorbic Acid (VITAMIN C WITH ROSE HIPS) 1000 MG tablet Take 1,000 mg by mouth daily.   aspirin 81 MG tablet Take 81 mg by mouth every other day.    bumetanide (BUMEX) 2 MG tablet TAKE 1 TABLET TWICE A DAY FOR BLOOD PRESSURE AND FLUID   famotidine (PEPCID) 40 MG tablet TAKE 1 TABLET BY MOUTH AT  BEDTIME TO PREVENT  INDIGESTION AND HEARTBURN   Magnesium 250 MG TABS Take 2 tablets by mouth 2 (two) times daily.   Multiple  Vitamin (MULTIVITAMIN) tablet Take 1 tablet by mouth daily.   OVER THE COUNTER MEDICATION Takes OTC Allegra for allergies.   potassium chloride SA (KLOR-CON) 20 MEQ tablet Take  1 tablet  2 x /day  for Potassium   rosuvastatin (CRESTOR) 20 MG tablet Take  1 tablet   Daily  for  Cholesterol                                                            /                            TAKE BY MOUTH   Zinc 50 MG CAPS Take by mouth daily.   fluticasone (FLONASE) 50 MCG/ACT nasal spray Place 1 spray into both nostrils daily.   lidocaine (LIDODERM) 5 % Place 1 patch onto the skin daily as needed for up to 15 doses. Remove & Discard patch within 12 hours or as directed by MD (Patient not taking: Reported on 07/11/2021)   No current facility-administered medications on file prior to visit.    Allergies:  Allergies  Allergen Reactions   Alprazolam Other (See Comments)     Unknown reaction per pt   Lexapro [Escitalopram]     Diarrhea/nausea    Prednisone     Only high doses   Surgical History:  She  has a past surgical history that includes goiter removed fro thyroid; Foot surgery; Colonoscopy; Cholecystectomy (04/14/2012); Open reduction internal fixation (orif) distal radial fracture (Right, 07/04/2015); carpel tunnel surgery (Right); Wisdom tooth extraction; Eye surgery; and Polypectomy. Family History:  Herfamily history includes Arrhythmia in her mother; Breast cancer in her maternal aunt; COPD in her paternal grandfather; Colon cancer (age of onset: 35) in her father; Colon polyps in her father; Hypertension in her mother; Macular degeneration in her brother. Social History:   reports that she has never smoked. She has never used smokeless tobacco. She reports that she does not drink alcohol and does not use drugs.  ROS: all negative except above.   Physical Exam:  BP 136/70    Pulse 68    Temp 97.9 F (36.6 C)    Resp 17    Wt 155 lb (70.3 kg)    SpO2 98%    BMI 30.27 kg/m   General  Appearance: Well nourished, in no apparent distress. Eyes: conjunctiva no swelling or erythema ENT/Mouth: No erythema, swelling, or exudate on post pharynx.  Tonsils not swollen or erythematous. Hearing normal.  Neck: Supple, thyroid normal.  Respiratory: Respiratory effort normal, BS equal bilaterally without rales, rhonchi, wheezing or stridor.  Cardio: RRR with no MRGs. Brisk peripheral pulses without edema.  Abdomen: Soft, + BS.  Non tender, no guarding, rebound, hernias, masses. Lymphatics: Non tender without lymphadenopathy.  Musculoskeletal: Full ROM, 5/5 strength, normal gait. No chest wall tenderness, denies chest wall pain with chest/shoulder ROM.  Skin: Warm, dry without rashes, lesions, ecchymosis.  Neuro:Normal muscle tone, no cerebellar symptoms. Sensation intact.  Psych: Awake and oriented X 3, normal affect, Insight and Judgment appropriate.     [name], NP 4:58 PM [facility]

## 2021-07-11 NOTE — Patient Instructions (Addendum)
°  Possible causes of chest pain:   Acid reflux - Continue famotidine daily - if you have more severe chest pain, could try taking maalox or mylanta   Chest wall pain - if you can move your arm and make it hurt or press on chest and make it hurt - it is NOT your heart or lungs, can try putting ice on, salon pas, take an tylenol, use aspercreme, etc should help  Anxiety - try taking buspirone as needed and see if this helps

## 2021-08-12 ENCOUNTER — Other Ambulatory Visit: Payer: Self-pay | Admitting: Adult Health

## 2021-08-12 DIAGNOSIS — E790 Hyperuricemia without signs of inflammatory arthritis and tophaceous disease: Secondary | ICD-10-CM

## 2021-08-12 DIAGNOSIS — K219 Gastro-esophageal reflux disease without esophagitis: Secondary | ICD-10-CM

## 2021-08-24 DIAGNOSIS — M25511 Pain in right shoulder: Secondary | ICD-10-CM | POA: Diagnosis not present

## 2021-08-24 DIAGNOSIS — G5601 Carpal tunnel syndrome, right upper limb: Secondary | ICD-10-CM | POA: Diagnosis not present

## 2021-08-24 DIAGNOSIS — G5603 Carpal tunnel syndrome, bilateral upper limbs: Secondary | ICD-10-CM | POA: Diagnosis not present

## 2021-08-24 DIAGNOSIS — G5602 Carpal tunnel syndrome, left upper limb: Secondary | ICD-10-CM | POA: Diagnosis not present

## 2021-08-24 DIAGNOSIS — M542 Cervicalgia: Secondary | ICD-10-CM | POA: Diagnosis not present

## 2021-09-09 ENCOUNTER — Encounter: Payer: Self-pay | Admitting: Internal Medicine

## 2021-09-09 ENCOUNTER — Ambulatory Visit (INDEPENDENT_AMBULATORY_CARE_PROVIDER_SITE_OTHER): Payer: Medicare Other | Admitting: Internal Medicine

## 2021-09-09 ENCOUNTER — Other Ambulatory Visit: Payer: Self-pay

## 2021-09-09 VITALS — BP 140/82 | HR 51 | Temp 97.3°F | Resp 12 | Wt 154.2 lb

## 2021-09-09 DIAGNOSIS — Z79899 Other long term (current) drug therapy: Secondary | ICD-10-CM

## 2021-09-09 DIAGNOSIS — G608 Other hereditary and idiopathic neuropathies: Secondary | ICD-10-CM

## 2021-09-09 DIAGNOSIS — I10 Essential (primary) hypertension: Secondary | ICD-10-CM

## 2021-09-09 DIAGNOSIS — M1000 Idiopathic gout, unspecified site: Secondary | ICD-10-CM

## 2021-09-09 DIAGNOSIS — R7309 Other abnormal glucose: Secondary | ICD-10-CM | POA: Diagnosis not present

## 2021-09-09 DIAGNOSIS — E782 Mixed hyperlipidemia: Secondary | ICD-10-CM | POA: Diagnosis not present

## 2021-09-09 DIAGNOSIS — E559 Vitamin D deficiency, unspecified: Secondary | ICD-10-CM | POA: Diagnosis not present

## 2021-09-09 NOTE — Patient Instructions (Signed)

## 2021-09-09 NOTE — Progress Notes (Signed)
? ?    ? ?Future Appointments  ?Date Time Provider Department  ?09/09/2021  3:30 PM Unk Pinto, MD [facility]  ?12/11/2021          CPE   3:00 PM Liane Comber, NP [facility]  ?06/05/2022        Wellness 11:00 AM Magda Bernheim, NP [facility]  ? ? ?History of Present Illness: ? ? ?    This very nice 66 y.o. DWF presents for 3 month follow up with HTN, HLD, Pre-Diabetes and Vitamin D Deficiency. Patient has GERD & IBS-C controlled on her meds. Also, she has history of Gout controlled on her Allopurinol.  ? ? ?    Patient is treated for HTN  (2000) & BP has been controlled at home. Today?s BP is at goal - 140/82. Patient has had no complaints of any cardiac type chest pain, palpitations, dyspnea / orthopnea / PND, dizziness, claudication, or dependent edema. ? ? ?    Hyperlipidemia is controlled with diet & Rosuvastatin. Patient denies myalgias or other med SE?s. Last Lipids were at goal : ? ?Lab Results  ?Component Value Date  ? CHOL 150 06/05/2021  ? HDL 61 06/05/2021  ? Streeter 67 06/05/2021  ? TRIG 141 06/05/2021  ? CHOLHDL 2.5 06/05/2021  ? ? ?                                                Patient has hx/o mild peripheral sensory and is monitored expectantly for prediabetes/ T2_DM and has had no symptoms of reactive hypoglycemia, diabetic polys, paresthesias or visual blurring.  Last A1c was normal & at goal : ? ?Lab Results  ?Component Value Date  ? HGBA1C 5.3 06/05/2021  ? ?    ? ?Further, the patient also has history of Vitamin D Deficiency ("26" /2008) and supplements vitamin D without any suspected side-effects. Last vitamin D was at goal : ? ?Lab Results  ?Component Value Date  ? VD25OH 84 11/29/2020  ? ? ? ?Current Outpatient Medications on File Prior to Visit  ?Medication Sig  ? allopurinol (ZYLOPRIM) 300 MG tablet TAKE 1 TABLET DAILY   ? Ascorbic Acid 1000 MG tablet Take daily.  ? aspirin 81 MG tablet Take every other day.   ? bumetanide 2 MG tablet TAKE 1 TABLET TWICE A DAY   ? busPIRone (BUSPAR)  5 MG tablet Take 1 tab up to three times a day as needed  ? famotidine  40 MG tablet TAKE 1 TABLET  AT  BEDTIME   ? FLONASE  nasal spray Place 1 spray into both nostrils daily.  ? Magnesium 250 MG TABS Take 2 tablets  2  times daily.  ? Multiple Vitamin  Take 1 tablet  daily.  ? OTC Turin for allergies.  ? potassium chloride 20 MEQ tablet Take  1 tablet  2 x /day   ? rosuvastatin 20 MG tablet Take  1 tablet   Daily    ? Zinc 50 MG CAPS Take by mouth daily.  ? ? ? ?Allergies  ?Allergen Reactions  ? Alprazolam Other (See Comments)  ?   Unknown reaction per pt  ? Lexapro [Escitalopram]   ?  Diarrhea/nausea   ? Prednisone   ?  Only high doses  ? ? ? ?PMHx:   ?Past Medical History:  ?Diagnosis Date  ?  Abnormal glucose 02/05/2015  ? Allergy   ? seasonal  ? Contact lens/glasses fitting   ? wears contacts or glasses  ? GERD (gastroesophageal reflux disease)   ? Hyperlipidemia   ? Hypertension   ? Hyperuricemia 08/13/2014  ? Irritable bowel syndrome 10/18/2007  ? Irritable bowel syndrome (IBS)   ? Medication management 12/26/2013  ? Peripheral neuropathy   ? in feet  ? POLYP, COLON 10/18/2007  ? PONV (postoperative nausea and vomiting)   ? nausea, slow to wake up at times  ? Vitamin D deficiency   ? ? ? ?Immunization History  ?Administered Date(s) Administered  ? Influenza Inj  04/21/2018, 05/18/2019, 06/07/2020  ? Influenza, High Dose 06/05/2021  ? PFIZER SARS-COV-2 Vacc 08/30/2019, 09/19/2019, 10/12/2019  ? PPD Test 04/21/2018, 05/18/2019  ? Pneumococcal -13 08/13/2020  ? Pneumococcal -23 11/23/2008  ? Td 11/29/2020  ? Tdap 11/29/2009  ? SARS-COV-2 Vacc 09/19/2019  ? ? ? ?Past Surgical History:  ?Procedure Laterality Date  ? carpel tunnel surgery Right   ? CHOLECYSTECTOMY  04/14/2012  ? Procedure: LAPAROSCOPIC CHOLECYSTECTOMY;  Surgeon: Rolm Bookbinder, MD;  Location: [facility];  Service: General;  Laterality: N/A;  ? COLONOSCOPY    ? EYE SURGERY    ? at age 68yr ? FOOT SURGERY    ? both feet - little  toes on each foot  ? goiter removed fro thyroid    ? OPEN REDUCTION INTERNAL FIXATION (ORIF) DISTAL RADIAL FRACTURE Right 07/04/2015  ? Procedure: OPEN REDUCTION INTERNAL FIXATION (ORIF) RIGHT DISTAL RADIUS FRACTURE;  Surgeon: FIran Planas MD;  Location: [facility]  Service: Orthopedics;  Laterality: Right;  ? POLYPECTOMY    ? WISDOM TOOTH EXTRACTION    ? ? ?FHx:    Reviewed / unchanged ? ?SHx:    Reviewed / unchanged  ? ?Systems Review: ? ?Constitutional: Denies fever, chills, wt changes, headaches, insomnia, fatigue, night sweats, change in appetite. ?Eyes: Denies redness, blurred vision, diplopia, discharge, itchy, watery eyes.  ?ENT: Denies discharge, congestion, post nasal drip, epistaxis, sore throat, earache, hearing loss, dental pain, tinnitus, vertigo, sinus pain, snoring.  ?CV: Denies chest pain, palpitations, irregular heartbeat, syncope, dyspnea, diaphoresis, orthopnea, PND, claudication or edema. ?Respiratory: denies cough, dyspnea, DOE, pleurisy, hoarseness, laryngitis, wheezing.  ?Gastrointestinal: Denies dysphagia, odynophagia, heartburn, reflux, water brash, abdominal pain or cramps, nausea, vomiting, bloating, diarrhea, constipation, hematemesis, melena, hematochezia  or hemorrhoids. ?Genitourinary: Denies dysuria, frequency, urgency, nocturia, hesitancy, discharge, hematuria or flank pain. ?Musculoskeletal: Denies arthralgias, myalgias, stiffness, jt. swelling, pain, limping or strain/sprain.  ?Skin: Denies pruritus, rash, hives, warts, acne, eczema or change in skin lesion(s). ?Neuro: No weakness, tremor, incoordination, spasms, paresthesia or pain. ?Psychiatric: Denies confusion, memory loss or sensory loss. ?Endo: Denies change in weight, skin or hair change.  ?Heme/Lymph: No excessive bleeding, bruising or enlarged lymph nodes. ? ?Physical Exam ? ?BP 140/82   Pulse (!) 51   Temp (!) 97.3 ?F (36.3 ?C)   Wt 154 lb 3.2 oz (69.9 kg)   SpO2 99%   BMI 30.12 kg/m?  ? ?Appears  well nourished, well  groomed  and in no distress. ? ?Eyes: PERRLA, EOMs, conjunctiva no swelling or erythema. ?Sinuses: No frontal/maxillary tenderness ?ENT/Mouth: EAC's clear, TM's nl w/o erythema, bulging. Nares clear w/o erythema, swelling, exudates. Oropharynx clear without erythema or exudates. Oral hygiene is good. Tongue normal, non obstructing. Hearing intact.  ?Neck: Supple. Thyroid not palpable. Car 2+/2+ without bruits, nodes or JVD. ?Chest: Respirations nl with BS clear & equal w/o  rales, rhonchi, wheezing or stridor.  ?Cor: Heart sounds normal w/ regular rate and rhythm without sig. murmurs, gallops, clicks or rubs. Peripheral pulses normal and equal  without edema.  ?Abdomen: Soft & bowel sounds normal. Non-tender w/o guarding, rebound, hernias, masses or organomegaly.  ?Lymphatics: Unremarkable.  ?Musculoskeletal: Full ROM all peripheral extremities, joint stability, 5/5 strength and normal gait.  ?Skin: Warm, dry without exposed rashes, lesions or ecchymosis apparent.  ?Neuro: Cranial nerves intact, reflexes equal bilaterally. Sensory-motor testing grossly intact. Tendon reflexes grossly intact.  ?Pysch: Alert & oriented x 3.  Insight and judgement nl & appropriate. No ideations. ? ?Assessment and Plan: ?                           ?1. Essential hypertension ? ?- Continue medication, monitor blood pressure at home.  ?- Continue DASH diet.  Reminder to go to the ER if any CP,  ?SOB, nausea, dizziness, severe HA, changes vision/speech. ?     ?- CBC with Differential/Platelet ?- COMPLETE METABOLIC PANEL WITH GFR ?- Magnesium ?- TSH ? ?2. Hyperlipidemia, mixed ? ?- Continue diet/meds, exercise,& lifestyle modifications.  ?- Continue monitor periodic cholesterol/liver & renal functions  ?   ?- Lipid panel ?- TSH ? ?3. Abnormal glucose ? ?- Continue diet, exercise  ?- Lifestyle modifications.  ?- Monitor appropriate labs ?  ?- Hemoglobin A1c ?- Insulin, random ? ?4. Vitamin D deficiency ? ?- Continue supplementation ?   ?-  VITAMIN D 25 Hydroxy  ? ?5. Peripheral sensory neuropathy ? ? ?6. Idiopathic gout ? ?- Uric acid ? ?7. Medication management ? ?- CBC with Differential/Platelet ?- COMPLETE METABOLIC PANEL WITH GFR ?- Magnesium

## 2021-09-10 LAB — COMPLETE METABOLIC PANEL WITH GFR
AG Ratio: 2 (calc) (ref 1.0–2.5)
ALT: 30 U/L — ABNORMAL HIGH (ref 6–29)
AST: 32 U/L (ref 10–35)
Albumin: 4.5 g/dL (ref 3.6–5.1)
Alkaline phosphatase (APISO): 71 U/L (ref 37–153)
BUN/Creatinine Ratio: 25 (calc) — ABNORMAL HIGH (ref 6–22)
BUN: 27 mg/dL — ABNORMAL HIGH (ref 7–25)
CO2: 32 mmol/L (ref 20–32)
Calcium: 9.7 mg/dL (ref 8.6–10.4)
Chloride: 101 mmol/L (ref 98–110)
Creat: 1.08 mg/dL — ABNORMAL HIGH (ref 0.50–1.05)
Globulin: 2.3 g/dL (calc) (ref 1.9–3.7)
Glucose, Bld: 77 mg/dL (ref 65–99)
Potassium: 3.9 mmol/L (ref 3.5–5.3)
Sodium: 143 mmol/L (ref 135–146)
Total Bilirubin: 0.7 mg/dL (ref 0.2–1.2)
Total Protein: 6.8 g/dL (ref 6.1–8.1)
eGFR: 57 mL/min/{1.73_m2} — ABNORMAL LOW (ref >=60)

## 2021-09-10 LAB — CBC WITH DIFFERENTIAL/PLATELET
Absolute Monocytes: 686 cells/uL (ref 200–950)
Basophils Absolute: 104 cells/uL (ref 0–200)
Basophils Relative: 1 %
Eosinophils Absolute: 281 cells/uL (ref 15–500)
Eosinophils Relative: 2.7 %
HCT: 42.1 % (ref 35.0–45.0)
Hemoglobin: 14.6 g/dL (ref 11.7–15.5)
Lymphs Abs: 3182 cells/uL (ref 850–3900)
MCH: 33.4 pg — ABNORMAL HIGH (ref 27.0–33.0)
MCHC: 34.7 g/dL (ref 32.0–36.0)
MCV: 96.3 fL (ref 80.0–100.0)
MPV: 13.5 fL — ABNORMAL HIGH (ref 7.5–12.5)
Monocytes Relative: 6.6 %
Neutro Abs: 6146 cells/uL (ref 1500–7800)
Neutrophils Relative %: 59.1 %
Platelets: 294 10*3/uL (ref 140–400)
RBC: 4.37 10*6/uL (ref 3.80–5.10)
RDW: 12 % (ref 11.0–15.0)
Total Lymphocyte: 30.6 %
WBC: 10.4 10*3/uL (ref 3.8–10.8)

## 2021-09-10 LAB — HEMOGLOBIN A1C
Hgb A1c MFr Bld: 5.3 % of total Hgb (ref <5.7)
Mean Plasma Glucose: 105 mg/dL
eAG (mmol/L): 5.8 mmol/L

## 2021-09-10 LAB — LIPID PANEL
Cholesterol: 150 mg/dL (ref <200)
HDL: 68 mg/dL (ref >=50)
LDL Cholesterol (Calc): 62 mg/dL (calc)
Non-HDL Cholesterol (Calc): 82 mg/dL (calc) (ref <130)
Total CHOL/HDL Ratio: 2.2 (calc) (ref <5.0)
Triglycerides: 123 mg/dL (ref <150)

## 2021-09-10 LAB — TSH: TSH: 1.72 mIU/L (ref 0.40–4.50)

## 2021-09-10 LAB — VITAMIN D 25 HYDROXY (VIT D DEFICIENCY, FRACTURES): Vit D, 25-Hydroxy: 93 ng/mL (ref 30–100)

## 2021-09-10 LAB — MAGNESIUM: Magnesium: 2.1 mg/dL (ref 1.5–2.5)

## 2021-09-10 LAB — INSULIN, RANDOM: Insulin: 6.7 u[IU]/mL

## 2021-09-10 LAB — URIC ACID: Uric Acid, Serum: 3.3 mg/dL (ref 2.5–7.0)

## 2021-09-10 NOTE — Progress Notes (Signed)
<><><><><><><><><><><><><><><><><><><><><><><><><><><><><><><><><> ?<><><><><><><><><><><><><><><><><><><><><><><><><><><><><><><><><> ?-   Test results slightly outside the reference range are not unusual. ?If there is anything important, I will review this with you,  ?otherwise it is considered normal test values.  ?If you have further questions,  ?please do not hesitate to contact me at the office or via My Chart.  ?<><><><><><><><><><><><><><><><><><><><><><><><><><><><><><><><><> ?<><><><><><><><><><><><><><><><><><><><><><><><><><><><><><><><><> ? ?-  Total Chol = 150   & LDL Chol = 62   -  Excellent  ? ?- Very low risk for Heart Attack  / Stroke ?============================================================ ?============================================================ ? ?-  A1c - Normal - No Diabetes   - Great !  ?<><><><><><><><><><><><><><><><><><><><><><><><><><><><><><><><><> ?<><><><><><><><><><><><><><><><><><><><><><><><><><><><><><><><><> ? ?-  Vitamin D =- 93 - Excellent   ! ?<><><><><><><><><><><><><><><><><><><><><><><><><><><><><><><><><> ?<><><><><><><><><><><><><><><><><><><><><><><><><><><><><><><><><> ? ?-  Uric Acid  /Gout test is Normal    &   OK  ?<><><><><><><><><><><><><><><><><><><><><><><><><><><><><><><><><> ?<><><><><><><><><><><><><><><><><><><><><><><><><><><><><><><><><> ? ?-  All else is [place] - with NO SIGNIFICANT Abnormalities. ? ?<><><><><><><><><><><><><><><><><><><><><><><><><><><><><><><><><> ?<><><><><><><><><><><><><><><><><><><><><><><><><><><><><><><><><> ? ?-   ? ? ? ? ? ? ? ? ? ? ? ? ? ? ? ? ? ? ? ? ? ?

## 2021-09-27 ENCOUNTER — Other Ambulatory Visit: Payer: Self-pay | Admitting: Nurse Practitioner

## 2021-09-27 DIAGNOSIS — M25511 Pain in right shoulder: Secondary | ICD-10-CM | POA: Diagnosis not present

## 2021-09-27 DIAGNOSIS — M542 Cervicalgia: Secondary | ICD-10-CM | POA: Diagnosis not present

## 2021-09-27 DIAGNOSIS — I10 Essential (primary) hypertension: Secondary | ICD-10-CM

## 2021-10-16 ENCOUNTER — Other Ambulatory Visit: Payer: Self-pay | Admitting: Adult Health

## 2021-10-16 DIAGNOSIS — I10 Essential (primary) hypertension: Secondary | ICD-10-CM

## 2021-10-24 DIAGNOSIS — M25511 Pain in right shoulder: Secondary | ICD-10-CM | POA: Diagnosis not present

## 2021-11-05 DIAGNOSIS — M25511 Pain in right shoulder: Secondary | ICD-10-CM | POA: Diagnosis not present

## 2021-11-12 DIAGNOSIS — M25511 Pain in right shoulder: Secondary | ICD-10-CM | POA: Diagnosis not present

## 2021-11-12 DIAGNOSIS — M75121 Complete rotator cuff tear or rupture of right shoulder, not specified as traumatic: Secondary | ICD-10-CM | POA: Diagnosis not present

## 2021-11-22 DIAGNOSIS — H10023 Other mucopurulent conjunctivitis, bilateral: Secondary | ICD-10-CM | POA: Diagnosis not present

## 2021-12-02 ENCOUNTER — Encounter: Payer: Medicare Other | Admitting: Adult Health

## 2021-12-03 DIAGNOSIS — M25511 Pain in right shoulder: Secondary | ICD-10-CM | POA: Diagnosis not present

## 2021-12-03 DIAGNOSIS — M75121 Complete rotator cuff tear or rupture of right shoulder, not specified as traumatic: Secondary | ICD-10-CM | POA: Diagnosis not present

## 2021-12-11 ENCOUNTER — Encounter: Payer: Self-pay | Admitting: Adult Health

## 2021-12-11 ENCOUNTER — Ambulatory Visit (INDEPENDENT_AMBULATORY_CARE_PROVIDER_SITE_OTHER): Payer: Medicare Other | Admitting: Adult Health

## 2021-12-11 VITALS — BP 122/80 | HR 57 | Temp 97.7°F | Ht 66.75 in | Wt 155.2 lb

## 2021-12-11 DIAGNOSIS — E669 Obesity, unspecified: Secondary | ICD-10-CM

## 2021-12-11 DIAGNOSIS — R7309 Other abnormal glucose: Secondary | ICD-10-CM | POA: Diagnosis not present

## 2021-12-11 DIAGNOSIS — E559 Vitamin D deficiency, unspecified: Secondary | ICD-10-CM | POA: Diagnosis not present

## 2021-12-11 DIAGNOSIS — Z136 Encounter for screening for cardiovascular disorders: Secondary | ICD-10-CM

## 2021-12-11 DIAGNOSIS — E790 Hyperuricemia without signs of inflammatory arthritis and tophaceous disease: Secondary | ICD-10-CM

## 2021-12-11 DIAGNOSIS — M8580 Other specified disorders of bone density and structure, unspecified site: Secondary | ICD-10-CM

## 2021-12-11 DIAGNOSIS — Z1329 Encounter for screening for other suspected endocrine disorder: Secondary | ICD-10-CM

## 2021-12-11 DIAGNOSIS — I10 Essential (primary) hypertension: Secondary | ICD-10-CM | POA: Diagnosis not present

## 2021-12-11 DIAGNOSIS — F419 Anxiety disorder, unspecified: Secondary | ICD-10-CM

## 2021-12-11 DIAGNOSIS — Z23 Encounter for immunization: Secondary | ICD-10-CM | POA: Diagnosis not present

## 2021-12-11 DIAGNOSIS — Z79899 Other long term (current) drug therapy: Secondary | ICD-10-CM | POA: Diagnosis not present

## 2021-12-11 DIAGNOSIS — N289 Disorder of kidney and ureter, unspecified: Secondary | ICD-10-CM

## 2021-12-11 DIAGNOSIS — E782 Mixed hyperlipidemia: Secondary | ICD-10-CM

## 2021-12-11 DIAGNOSIS — N1831 Chronic kidney disease, stage 3a: Secondary | ICD-10-CM

## 2021-12-11 DIAGNOSIS — Z0001 Encounter for general adult medical examination with abnormal findings: Secondary | ICD-10-CM

## 2021-12-11 DIAGNOSIS — Z86010 Personal history of colonic polyps: Secondary | ICD-10-CM

## 2021-12-11 DIAGNOSIS — Z0000 Encounter for general adult medical examination without abnormal findings: Secondary | ICD-10-CM | POA: Diagnosis not present

## 2021-12-11 DIAGNOSIS — K219 Gastro-esophageal reflux disease without esophagitis: Secondary | ICD-10-CM

## 2021-12-11 DIAGNOSIS — K582 Mixed irritable bowel syndrome: Secondary | ICD-10-CM

## 2021-12-11 DIAGNOSIS — Z1389 Encounter for screening for other disorder: Secondary | ICD-10-CM

## 2021-12-11 DIAGNOSIS — Z8249 Family history of ischemic heart disease and other diseases of the circulatory system: Secondary | ICD-10-CM

## 2021-12-11 NOTE — Progress Notes (Unsigned)
CPE  Assessment:    Encounter for Annual Physical Exam with abnormal findings Due annually  Health Maintenance reviewed Healthy lifestyle reviewed and goals set ***  Essential hypertension Monitor blood pressure at home; call if consistently over 130/80 Continue DASH diet.   Reminder to go to the ER if any CP, SOB, nausea, dizziness, severe HA, changes vision/speech, left arm numbness and tingling and jaw pain. -     CBC with Differential/Platelet -     COMPLETE METABOLIC PANEL WITH GFR -     TSH -      UA/Microalbumin -      EKG  Hyperlipidemia, mixed Continue medications: rosuvastatin 6m *** Discussed dietary and exercise modifications Low fat diet -     Lipid panel -     TSH  Abnormal glucose Discussed dietary and exercise modifications -     Hemoglobin A1c  Vitamin D deficiency Continue supplementation to maintain goal of 70-100 -     Vitamin D  Gastroesophageal reflux disease, unspecified whether esophagitis present Well managed on current medications Discussed diet, avoiding triggers and other lifestyle changes       -      Magnesium   BMI 24 *** Discussed dietary and exercise modifications  Anxiety *** Start new medication as prescribed - lexapro 10 mg daily Stress management techniques discussed, increase water, good sleep hygiene discussed, increase exercise, and increase veggies.  Follow up 2-3 month, call the office if any new AE's from medications and we will switch them  Medication management Continued  Hyperuricemia Continue allopurinol [id number] No recent flares Discussed dietary modifications - Uric acid   Osteopenia - repeat dexa 2 yrs ( next due 04/2022), continue Vit D and Ca, weight bearing exercises  CKD 3 (HCC) Increase fluids, avoid NSAIDS, monitor sugars, will monitor Consider [place] if persistent protein, nephrology referral if unexplained trending down  - CMP/GFR - UA/microalbumin   IBS-D Contiue metamucil, try  "Align" 2-3 months  FODMAP reviewed; try tracking and elimination  if any worsening symptoms, blood in stool, AB pain, etc call office  Right shoulder pain/rotator cuff tear Ortho follows planning surgery No NSAIDs due to CKD, discussed can do tylenol [phone] mg TID PRN, topical such as voltaren/aspercreme is safe even with current renal state  LE edema Mild, dependent, resolves in AM - elevate legs TID, increase activity, increase water, decrease sodium intake.  Wear compression socks more routinely if available. Return to the office if any worsening. Check labs.    No orders of the defined types were placed in this encounter.   Over 40 minutes of face to face interview, exam, counseling, chart review and critical decision making was performed Future Appointments  Date Time Provider [place]12/14/2023 11:00 AM WiAlycia RossettiNP [facility] None  12/15/2022  3:00 PM WiAlycia RossettiNP [facility] None     Plan:   During the course of the visit the patient was educated and counseled about appropriate screening and preventive services including:   Pneumococcal vaccine  Prevnar 13 Influenza vaccine Td vaccine Screening electrocardiogram Bone densitometry screening Colorectal cancer screening Diabetes screening Glaucoma screening Nutrition counseling  Advanced directives: requested   Subjective:  [name] a [age].o. female who presents for CPE. She has History of colon polyps; Irritable bowel syndrome; GERD (gastroesophageal reflux disease); Hyperlipidemia, mixed; Essential hypertension; Vitamin D deficiency; Peripheral neuropathy; Hyperuricemia; Abnormal glucose; FHx: heart disease; Anxiety; CKD (chronic kidney disease) stage 3, GFR 30-59 ml/min (  [place]); Osteopenia; and Obesity (BMI 30.0-34.9) on their problem list.  She is single, 1 grown son, 4 grandchildren.  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   She has GERD controlled by lifestyle/  famotidine 40 mg daily.  Has IBS-D for many years, takes metamucil, unsure of triggers.   She has anxiety, off of lexapro as made diarrhea worse, was prescribed buspar TID but hasn't tried yet. Wants to hold off for now.   She has R rotator cuff tear, Dr. Victorino December at [facility] planning surgical repair in the next 6 months.   BMI is Body mass index is 24.49 kg/m., she has been working on diet and exercise. Limits red meat.  Wt Readings from Last 3 Encounters:  12/11/21 155 lb 3.2 oz (70.4 kg)  09/09/21 154 lb 3.2 oz (69.9 kg)  07/11/21 155 lb (70.3 kg)   She has had elevated blood pressure for  years. Her blood pressure has been controlled at home, today their BP is BP: 122/80 She does not workout. She denies chest pain, shortness of breath, dizziness.   She is on cholesterol medication  (rosuvastaitn 20 mg daily) and denies myalgias. Her cholesterol is not at goal. The cholesterol last visit was:   Lab Results  Component Value Date   CHOL 150 09/09/2021   HDL 68 09/09/2021   LDLCALC 62 09/09/2021   TRIG 123 09/09/2021   CHOLHDL 2.2 09/09/2021    She has not been working on diet and exercise for abnormal glucose/hx of prediabetes, and denies polydipsia, polyuria, visual disturbances, vomiting and weight loss. Last A1C in the office was:  Lab Results  Component Value Date   HGBA1C 5.3 09/09/2021   She denies NSAID, gets 64+ fluid ounces of water daily, has reduced bumex from BID to once daily. Last GFR: Lab Results  Component Value Date   EGFR 57 (L) 09/09/2021   EGFR 54 (L) 06/05/2021   EGFR 65 04/15/2021   Lab Results  Component Value Date   MICRALBCREAT 73 (H) 11/29/2020   Patient is on Vitamin D supplement.   Lab Results  Component Value Date   VD25OH 93 09/09/2021     Patient is on allopurinol for hyperuricemia and does not report a recent flare.  Lab Results  Component Value Date   LABURIC 3.3 09/09/2021      Medication Review: Current Outpatient  Medications on File Prior to Visit  Medication Sig Dispense Refill   allopurinol (ZYLOPRIM) 300 MG tablet TAKE 1 TABLET BY MOUTH  DAILY FOR GOUT PREVENTION 90 tablet 3   Ascorbic Acid (VITAMIN C WITH ROSE HIPS) 1000 MG tablet Take 1,000 mg by mouth daily.     aspirin 81 MG tablet Take 81 mg by mouth every other day.      bumetanide (BUMEX) 2 MG tablet TAKE 1 TABLET BY MOUTH  TWICE DAILY FOR BLOOD  PRESSURE AND FLUID 180 tablet 3   Cholecalciferol (VITAMIN D3) 125 MCG (5000 UT) CAPS Take by mouth daily.     Cyanocobalamin (B-12 PO) Take by mouth daily.     famotidine (PEPCID) 40 MG tablet TAKE 1 TABLET BY MOUTH AT  BEDTIME TO PREVENT  INDIGESTION AND HEARTBURN 90 tablet 3   fluticasone (FLONASE) 50 MCG/ACT nasal spray Place 1 spray into both nostrils daily.     lidocaine (LIDODERM) 5 % Place 1 patch onto the skin daily as needed for up to 15 doses. Remove & Discard patch within 12 hours or as directed  by MD 15 patch 0   Magnesium 250 MG TABS Take 2 tablets by mouth 2 (two) times daily.     Multiple Vitamin (MULTIVITAMIN) tablet Take 1 tablet by mouth daily.     OVER THE COUNTER MEDICATION Takes OTC Allegra for allergies.     potassium chloride SA (KLOR-CON M) 20 MEQ tablet TAKE 1 TABLET BY MOUTH  TWICE DAILY FOR POTASSIUM 180 tablet 3   rosuvastatin (CRESTOR) 20 MG tablet Take  1 tablet   Daily  for  Cholesterol                                                            /                            TAKE BY MOUTH 90 tablet 3   Zinc 50 MG CAPS Take by mouth daily.     busPIRone (BUSPAR) 5 MG tablet Take 1 tab up to three times a day as needed for anxiety. (Patient not taking: Reported on 09/09/2021) 30 tablet 0   No current facility-administered medications on file prior to visit.    Allergies  Allergen Reactions   Alprazolam Other (See Comments)     Unknown reaction per pt   Lexapro [Escitalopram]     Diarrhea/nausea    Prednisone     Only high doses    Current Problems  (verified) Patient Active Problem List   Diagnosis Date Noted   Obesity (BMI 30.0-34.9) 12/11/2021   CKD (chronic kidney disease) stage 3, GFR 30-59 ml/min (HCC) 11/29/2020   Osteopenia 11/29/2020   Anxiety 08/23/2019   FHx: heart disease 04/21/2018   Abnormal glucose 02/05/2015   Hyperuricemia 08/13/2014   GERD (gastroesophageal reflux disease)    Hyperlipidemia, mixed    Essential hypertension    Vitamin D deficiency    Peripheral neuropathy    History of colon polyps 10/18/2007   Irritable bowel syndrome 10/18/2007    Screening Tests Immunization History  Administered Date(s) Administered   Influenza Inj Mdck Quad With Preservative 04/21/2018, 05/18/2019, 06/07/2020   Influenza, High Dose Seasonal PF 06/05/2021   PFIZER(Purple Top)SARS-COV-2 Vaccination 08/30/2019, 09/19/2019, 10/12/2019   PPD Test 01/25/2014, 02/05/2015, 02/27/2016, 04/21/2018, 05/18/2019   Pneumococcal Conjugate-13 08/13/2020   Pneumococcal Polysaccharide-23 11/23/2008   Td 11/29/2020   Tdap 11/29/2009   Unspecified SARS-COV-2 Vaccination 09/19/2019   Health Maintenance  Topic Date Due   Zoster Vaccines- Shingrix (1 of 2) Never done   COVID-19 Vaccine (5 - Booster) 12/07/2019   Pneumonia Vaccine 56+ Years old (3 - PPSV23 if available, else PCV20) 08/13/2021   INFLUENZA VACCINE  01/21/2022   MAMMOGRAM  03/19/2022   COLONOSCOPY (Pts 45-35yr Insurance coverage will need to be confirmed)  08/31/2024   TETANUS/TDAP  11/30/2030   DEXA SCAN  Completed   Hepatitis C Screening  Completed   HPV VACCINES  Aged Out   Last colonoscopy: 08/2019, polyps, 5 years, Dr. PHilarie FredricksonLast mammogram: 02/2021, gets annually  Last pap smear/pelvic exam: Dr. CDanford Bad follows annually DEXA: 04/26/2020 (-1.6) Osteopenic  Shingrix: 2/2 at pharmacy, dates requested  Names of Other Physician/Practitioners you currently use: 1. [facility] here for primary care 2. Eye Exam,  [facility], glasses, mild  glaucoma, goes annually, last 07/2021 3. Dental Exam 2023, goes q55m Patient Care Team: MUnk Pinto MD as PCP - General (Internal Medicine)  SURGICAL HISTORY She  has a past surgical history that includes goiter removed fro thyroid; Foot surgery; Colonoscopy; Cholecystectomy (04/14/2012); Open reduction internal fixation (orif) distal radial fracture (Right, 07/04/2015); carpel tunnel surgery (Right); Wisdom tooth extraction; Eye surgery; and Polypectomy. FAMILY HISTORY Her family history includes Arrhythmia in her mother; Breast cancer in her maternal aunt; COPD in her paternal grandfather; Colon cancer (age of onset: 357 in her father; Colon polyps in her father; Hypertension in her mother; Macular degeneration in her brother. SOCIAL HISTORY She  reports that she has never smoked. She has never used smokeless tobacco. She reports that she does not drink alcohol and does not use drugs.    Review of Systems  Constitutional:  Negative for malaise/fatigue and weight loss.  HENT:  Negative for hearing loss and tinnitus.   Eyes:  Negative for blurred vision and double vision.  Respiratory:  Negative for cough, shortness of breath and wheezing.   Cardiovascular:  Negative for chest pain, palpitations, orthopnea, claudication and leg swelling.  Gastrointestinal:  Positive for diarrhea (intermittent diarrhea for many years). Negative for abdominal pain, blood in stool, constipation, heartburn, melena, nausea and vomiting.  Genitourinary: Negative.   Musculoskeletal:  Positive for joint pain (R shoulder, ortho following). Negative for myalgias.  Skin:  Negative for rash.  Neurological:  Negative for dizziness, tingling, sensory change, weakness and headaches.  Endo/Heme/Allergies:  Negative for polydipsia.  Psychiatric/Behavioral:  Negative for depression, substance abuse and suicidal ideas. The patient is nervous/anxious. The patient does not have insomnia.   All  other systems reviewed and are negative.    Objective:     Today's Vitals   12/11/21 1504  BP: 122/80  Pulse: (!) 57  Temp: 97.7 F (36.5 C)  SpO2: 99%  Weight: 155 lb 3.2 oz (70.4 kg)  Height: 5' 6.75" (1.695 m)   Body mass index is 24.49 kg/m.  General appearance: alert, no distress, WD/WN, female HEENT: normocephalic, sclerae anicteric, TMs pearly, nares patent, no discharge or erythema, pharynx normal Oral cavity: MMM, no lesions Neck: supple, no lymphadenopathy, no thyromegaly, no masses Heart: RRR, normal S1, S2, no murmurs Lungs: CTA bilaterally, no wheezes, rhonchi, or rales Abdomen: +bs, soft, non tender, non distended, no masses, no hepatomegaly, no splenomegaly Musculoskeletal: nontender, no swelling, no obvious deformity. Limited ROM R shoulder due to pain.  Extremities: Bil ankles 1+ edema, no cyanosis, no clubbing Pulses: 2+ symmetric, upper and lower extremities, normal cap refill Neurological: alert, oriented x 3, CN2-12 intact, strength normal upper extremities and lower extremities, sensation normal throughout, DTRs 2+ throughout, no cerebellar signs, gait normal Psychiatric: normal affect, behavior normal, pleasant  GU: defer to GYN Breasts: defer to GYN, getting mammograms  EKG: Sinus bradycardia, NSCPT  Katie Ribas NP 3:23 PM [facility]

## 2021-12-11 NOTE — Patient Instructions (Addendum)
Katie Cox , Thank you for taking time to come for your Annual Wellness Visit. I appreciate your ongoing commitment to your health goals. Please review the following plan we discussed and let me know if I can assist you in the future.   This is a list of the screening recommended for you and due dates:  Health Maintenance  Topic Date Due   Zoster (Shingles) Vaccine (1 of 2) Never done   Pneumonia Vaccine (3 - PPSV23 if available, else PCV20) 08/13/2021   COVID-19 Vaccine (5 - Booster) 12/27/2021*   Flu Shot  01/21/2022   Mammogram  03/19/2022   Colon Cancer Screening  08/31/2024   Tetanus Vaccine  11/30/2030   DEXA scan (bone density measurement)  Completed   Hepatitis C Screening: USPSTF Recommendation to screen - Ages 24-79 yo.  Completed   HPV Vaccine  Aged Out  *Topic was postponed. The date shown is not the original due date.      Try "align" probiotic - 2-3 months then stop if no benefit       Peripheral Edema  Peripheral edema is swelling that is caused by a buildup of fluid. Peripheral edema most often affects the lower legs, ankles, and feet. It can also develop in the arms, hands, and face. The area of the body that has peripheral edema will look swollen. It may also feel heavy or warm. Your clothes may start to feel tight. Pressing on the area may make a temporary dent in your skin (pitting edema). You may not be able to move your swollen arm or leg as much as usual. There are many causes of peripheral edema. It can happen because of a complication of other conditions such as heart failure, kidney disease, or a problem with your circulation. It also can be a side effect of certain medicines or happen because of an infection. It often happens to women during pregnancy. Sometimes, the cause is not known. Follow these instructions at home: Managing pain, stiffness, and swelling  Raise (elevate) your legs while you are sitting or lying down. Move around often to prevent  stiffness and to reduce swelling. Do not sit or stand for long periods of time. Do not wear tight clothing. Do not wear garters on your upper legs. Exercise your legs to get your circulation going. This helps to move the fluid back into your blood vessels, and it may help the swelling go down. Wear compression stockings as told by your health care provider. These stockings help to prevent blood clots and reduce swelling in your legs. It is important that these are the correct size. These stockings should be prescribed by your doctor to prevent possible injuries. If elastic bandages or wraps are recommended, use them as told by your health care provider. Medicines Take over-the-counter and prescription medicines only as told by your health care provider. Your health care provider may prescribe medicine to help your body get rid of excess water (diuretic). Take this medicine if you are told to take it. General instructions Eat a low-salt (low-sodium) diet as told by your health care provider. Sometimes, eating less salt may reduce swelling. Pay attention to any changes in your symptoms. Moisturize your skin daily to help prevent skin from cracking and draining. Keep all follow-up visits. This is important. Contact a health care provider if: You have a fever. You have swelling in only one leg. You have increased swelling, redness, or pain in one or both of your legs. You have  drainage or sores at the area where you have edema. Get help right away if: You have edema that starts suddenly or is getting worse, especially if you are pregnant or have a medical condition. You develop shortness of breath, especially when you are lying down. You have pain in your chest or abdomen. You feel weak. You feel like you will faint. These symptoms may be an emergency. Get help right away. Call 911. Do not wait to see if the symptoms will go away. Do not drive yourself to the hospital. Summary Peripheral  edema is swelling that is caused by a buildup of fluid. Peripheral edema most often affects the lower legs, ankles, and feet. Move around often to prevent stiffness and to reduce swelling. Do not sit or stand for long periods of time. Pay attention to any changes in your symptoms. Contact a health care provider if you have edema that starts suddenly or is getting worse, especially if you are pregnant or have a medical condition. Get help right away if you develop shortness of breath, especially when lying down. This information is not intended to replace advice given to you by your health care provider. Make sure you discuss any questions you have with your health care provider. Document Revised: 02/11/2021 Document Reviewed: 02/11/2021 Elsevier Patient Education  [place].

## 2021-12-12 ENCOUNTER — Encounter: Payer: Self-pay | Admitting: Adult Health

## 2021-12-12 LAB — LIPID PANEL
Cholesterol: 145 mg/dL (ref <200)
HDL: 65 mg/dL (ref >=50)
LDL Cholesterol (Calc): 60 mg/dL (calc)
Non-HDL Cholesterol (Calc): 80 mg/dL (calc) (ref <130)
Total CHOL/HDL Ratio: 2.2 (calc) (ref <5.0)
Triglycerides: 115 mg/dL (ref <150)

## 2021-12-12 LAB — COMPLETE METABOLIC PANEL WITH GFR
AG Ratio: 1.9 (calc) (ref 1.0–2.5)
ALT: 25 U/L (ref 6–29)
AST: 30 U/L (ref 10–35)
Albumin: 4.7 g/dL (ref 3.6–5.1)
Alkaline phosphatase (APISO): 75 U/L (ref 37–153)
BUN/Creatinine Ratio: 22 (calc) (ref 6–22)
BUN: 24 mg/dL (ref 7–25)
CO2: 29 mmol/L (ref 20–32)
Calcium: 10.3 mg/dL (ref 8.6–10.4)
Chloride: 101 mmol/L (ref 98–110)
Creat: 1.08 mg/dL — ABNORMAL HIGH (ref 0.50–1.05)
Globulin: 2.5 g/dL (calc) (ref 1.9–3.7)
Glucose, Bld: 77 mg/dL (ref 65–99)
Potassium: 3.7 mmol/L (ref 3.5–5.3)
Sodium: 141 mmol/L (ref 135–146)
Total Bilirubin: 1 mg/dL (ref 0.2–1.2)
Total Protein: 7.2 g/dL (ref 6.1–8.1)
eGFR: 56 mL/min/{1.73_m2} — ABNORMAL LOW (ref >=60)

## 2021-12-12 LAB — URINALYSIS, ROUTINE W REFLEX MICROSCOPIC
Bilirubin Urine: NEGATIVE
Glucose, UA: NEGATIVE
Hgb urine dipstick: NEGATIVE
Ketones, ur: NEGATIVE
Leukocytes,Ua: NEGATIVE
Nitrite: NEGATIVE
Protein, ur: NEGATIVE
Specific Gravity, Urine: 1.007 (ref 1.001–1.035)
pH: 7 (ref 5.0–8.0)

## 2021-12-12 LAB — MICROALBUMIN / CREATININE URINE RATIO
Creatinine, Urine: 23 mg/dL (ref 20–275)
Microalb Creat Ratio: 126 mcg/mg creat — ABNORMAL HIGH (ref <30)
Microalb, Ur: 2.9 mg/dL

## 2021-12-12 LAB — CBC WITH DIFFERENTIAL/PLATELET
Absolute Monocytes: 659 cells/uL (ref 200–950)
Basophils Absolute: 89 cells/uL (ref 0–200)
Basophils Relative: 1 %
Eosinophils Absolute: 134 cells/uL (ref 15–500)
Eosinophils Relative: 1.5 %
HCT: 43.4 % (ref 35.0–45.0)
Hemoglobin: 15.2 g/dL (ref 11.7–15.5)
Lymphs Abs: 3480 cells/uL (ref 850–3900)
MCH: 33.3 pg — ABNORMAL HIGH (ref 27.0–33.0)
MCHC: 35 g/dL (ref 32.0–36.0)
MCV: 95.2 fL (ref 80.0–100.0)
MPV: 13.3 fL — ABNORMAL HIGH (ref 7.5–12.5)
Monocytes Relative: 7.4 %
Neutro Abs: 4539 cells/uL (ref 1500–7800)
Neutrophils Relative %: 51 %
Platelets: 306 10*3/uL (ref 140–400)
RBC: 4.56 10*6/uL (ref 3.80–5.10)
RDW: 12.4 % (ref 11.0–15.0)
Total Lymphocyte: 39.1 %
WBC: 8.9 10*3/uL (ref 3.8–10.8)

## 2021-12-12 LAB — HEMOGLOBIN A1C
Hgb A1c MFr Bld: 5.2 % of total Hgb (ref <5.7)
Mean Plasma Glucose: 103 mg/dL
eAG (mmol/L): 5.7 mmol/L

## 2021-12-12 LAB — TSH: TSH: 1.98 mIU/L (ref 0.40–4.50)

## 2021-12-12 LAB — MAGNESIUM: Magnesium: 2.2 mg/dL (ref 1.5–2.5)

## 2021-12-12 LAB — URIC ACID: Uric Acid, Serum: 3.2 mg/dL (ref 2.5–7.0)

## 2021-12-12 LAB — VITAMIN D 25 HYDROXY (VIT D DEFICIENCY, FRACTURES): Vit D, 25-Hydroxy: 95 ng/mL (ref 30–100)

## 2021-12-12 MED ORDER — LOSARTAN POTASSIUM 25 MG PO TABS: ORAL_TABLET | ORAL | 0 refills | Status: DC

## 2021-12-26 DIAGNOSIS — M25511 Pain in right shoulder: Secondary | ICD-10-CM | POA: Diagnosis not present

## 2022-01-01 ENCOUNTER — Ambulatory Visit
Admission: RE | Admit: 2022-01-01 | Discharge: 2022-01-01 | Disposition: A | Discharge status: Home / Self Care | Payer: Medicare Other | Source: Ambulatory Visit | Attending: Adult Health | Admitting: Adult Health

## 2022-01-01 DIAGNOSIS — N289 Disorder of kidney and ureter, unspecified: Secondary | ICD-10-CM

## 2022-01-01 DIAGNOSIS — N189 Chronic kidney disease, unspecified: Secondary | ICD-10-CM | POA: Diagnosis not present

## 2022-01-01 DIAGNOSIS — N1831 Chronic kidney disease, stage 3a: Secondary | ICD-10-CM

## 2022-01-03 DIAGNOSIS — M25511 Pain in right shoulder: Secondary | ICD-10-CM | POA: Diagnosis not present

## 2022-01-03 DIAGNOSIS — M25611 Stiffness of right shoulder, not elsewhere classified: Secondary | ICD-10-CM | POA: Diagnosis not present

## 2022-01-06 DIAGNOSIS — M25511 Pain in right shoulder: Secondary | ICD-10-CM | POA: Diagnosis not present

## 2022-01-06 DIAGNOSIS — M25611 Stiffness of right shoulder, not elsewhere classified: Secondary | ICD-10-CM | POA: Diagnosis not present

## 2022-01-09 DIAGNOSIS — M25511 Pain in right shoulder: Secondary | ICD-10-CM | POA: Diagnosis not present

## 2022-01-09 DIAGNOSIS — M25611 Stiffness of right shoulder, not elsewhere classified: Secondary | ICD-10-CM | POA: Diagnosis not present

## 2022-01-13 DIAGNOSIS — M25511 Pain in right shoulder: Secondary | ICD-10-CM | POA: Diagnosis not present

## 2022-01-13 DIAGNOSIS — M25611 Stiffness of right shoulder, not elsewhere classified: Secondary | ICD-10-CM | POA: Diagnosis not present

## 2022-01-16 DIAGNOSIS — M25511 Pain in right shoulder: Secondary | ICD-10-CM | POA: Diagnosis not present

## 2022-01-16 DIAGNOSIS — M25611 Stiffness of right shoulder, not elsewhere classified: Secondary | ICD-10-CM | POA: Diagnosis not present

## 2022-01-20 ENCOUNTER — Other Ambulatory Visit: Payer: Self-pay

## 2022-01-20 DIAGNOSIS — F419 Anxiety disorder, unspecified: Secondary | ICD-10-CM

## 2022-01-20 MED ORDER — BUSPIRONE HCL 5 MG PO TABS: ORAL_TABLET | ORAL | 0 refills | Status: DC

## 2022-01-21 DIAGNOSIS — M25511 Pain in right shoulder: Secondary | ICD-10-CM | POA: Diagnosis not present

## 2022-01-21 DIAGNOSIS — M25611 Stiffness of right shoulder, not elsewhere classified: Secondary | ICD-10-CM | POA: Diagnosis not present

## 2022-01-22 NOTE — Progress Notes (Unsigned)
Assessment and Plan:  There are no diagnoses linked to this encounter.    Further disposition pending results of labs. Discussed med's effects and SE's.   Over 30 minutes of exam, counseling, chart review, and critical decision making was performed.   Future Appointments  Date Time Provider [place]  01/23/2022  3:30 PM Alycia Rossetti, NP [facility] None  06/05/2022 11:00 AM Alycia Rossetti, NP [facility] None  12/15/2022  3:00 PM Alycia Rossetti, NP [facility] None    ------------------------------------------------------------------------------------------------------------------   HPI There were no vitals taken for this visit. 66 y.o.female presents for  Past Medical History:  Diagnosis Date   Abnormal glucose 02/05/2015   Allergy    seasonal   Contact lens/glasses fitting    wears contacts or glasses   GERD (gastroesophageal reflux disease)    Hyperlipidemia    Hypertension    Hyperuricemia 08/13/2014   Irritable bowel syndrome 10/18/2007   Irritable bowel syndrome (IBS)    Medication management 12/26/2013   Peripheral neuropathy    in feet   POLYP, COLON 10/18/2007   PONV (postoperative nausea and vomiting)    nausea, slow to wake up at times   Vitamin D deficiency      Allergies  Allergen Reactions   Alprazolam Other (See Comments)     Unknown reaction per pt   Lexapro [Escitalopram]     Diarrhea/nausea    Prednisone     Only high doses    Current Outpatient Medications on File Prior to Visit  Medication Sig   allopurinol (ZYLOPRIM) 300 MG tablet TAKE 1 TABLET BY MOUTH  DAILY FOR GOUT PREVENTION   Ascorbic Acid (VITAMIN C WITH ROSE HIPS) 1000 MG tablet Take 1,000 mg by mouth daily.   aspirin 81 MG tablet Take 81 mg by mouth every other day.    bumetanide (BUMEX) 2 MG tablet TAKE 1 TABLET BY MOUTH  TWICE DAILY FOR BLOOD  PRESSURE AND FLUID   busPIRone (BUSPAR) 5 MG tablet Take 1 tab up to three times a day as needed for anxiety.    Cholecalciferol (VITAMIN D3) 125 MCG (5000 UT) CAPS Take by mouth daily.   Cyanocobalamin (B-12 PO) Take by mouth daily.   famotidine (PEPCID) 40 MG tablet TAKE 1 TABLET BY MOUTH AT  BEDTIME TO PREVENT  INDIGESTION AND HEARTBURN   fluticasone (FLONASE) 50 MCG/ACT nasal spray Place 1 spray into both nostrils daily.   lidocaine (LIDODERM) 5 % Place 1 patch onto the skin daily as needed for up to 15 doses. Remove & Discard patch within 12 hours or as directed by MD   losartan (COZAAR) 25 MG tablet Start by taking 1/2 tab daily for kidneys/protein; increase to taking whole tab in 1 week if blood pressure tolerates.   Magnesium 250 MG TABS Take 2 tablets by mouth 2 (two) times daily.   Multiple Vitamin (MULTIVITAMIN) tablet Take 1 tablet by mouth daily.   OVER THE COUNTER MEDICATION Takes OTC Allegra for allergies.   potassium chloride SA (KLOR-CON M) 20 MEQ tablet TAKE 1 TABLET BY MOUTH  TWICE DAILY FOR POTASSIUM   rosuvastatin (CRESTOR) 20 MG tablet Take  1 tablet   Daily  for  Cholesterol                                                            /  TAKE BY MOUTH   Zinc 50 MG CAPS Take by mouth daily.   No current facility-administered medications on file prior to visit.    ROS: all negative except above.   Physical Exam:  There were no vitals taken for this visit.  General Appearance: Well nourished, in no apparent distress. Eyes: PERRLA, EOMs, conjunctiva no swelling or erythema Sinuses: No Frontal/maxillary tenderness ENT/Mouth: Ext aud canals clear, TMs without erythema, bulging. No erythema, swelling, or exudate on post pharynx.  Tonsils not swollen or erythematous. Hearing normal.  Neck: Supple, thyroid normal.  Respiratory: Respiratory effort normal, BS equal bilaterally without rales, rhonchi, wheezing or stridor.  Cardio: RRR with no MRGs. Brisk peripheral pulses without edema.  Abdomen: Soft, + BS.  Non tender, no guarding, rebound, hernias,  masses. Lymphatics: Non tender without lymphadenopathy.  Musculoskeletal: Full ROM, 5/5 strength, normal gait.  Skin: Warm, dry without rashes, lesions, ecchymosis.  Neuro: Cranial nerves intact. Normal muscle tone, no cerebellar symptoms. Sensation intact.  Psych: Awake and oriented X 3, normal affect, Insight and Judgment appropriate.     Alycia Rossetti, NP 1:43 PM [facility]

## 2022-01-23 ENCOUNTER — Ambulatory Visit (INDEPENDENT_AMBULATORY_CARE_PROVIDER_SITE_OTHER): Payer: Medicare Other | Admitting: Nurse Practitioner

## 2022-01-23 ENCOUNTER — Encounter: Payer: Self-pay | Admitting: Nurse Practitioner

## 2022-01-23 VITALS — BP 120/68 | HR 52 | Temp 97.5°F | Ht 66.5 in | Wt 157.4 lb

## 2022-01-23 DIAGNOSIS — N1831 Chronic kidney disease, stage 3a: Secondary | ICD-10-CM | POA: Diagnosis not present

## 2022-01-23 DIAGNOSIS — R140 Abdominal distension (gaseous): Secondary | ICD-10-CM | POA: Diagnosis not present

## 2022-01-23 DIAGNOSIS — I10 Essential (primary) hypertension: Secondary | ICD-10-CM | POA: Diagnosis not present

## 2022-01-24 ENCOUNTER — Other Ambulatory Visit: Payer: Self-pay | Admitting: Nurse Practitioner

## 2022-01-24 DIAGNOSIS — M25611 Stiffness of right shoulder, not elsewhere classified: Secondary | ICD-10-CM | POA: Diagnosis not present

## 2022-01-24 DIAGNOSIS — M25511 Pain in right shoulder: Secondary | ICD-10-CM | POA: Diagnosis not present

## 2022-01-24 DIAGNOSIS — N1831 Chronic kidney disease, stage 3a: Secondary | ICD-10-CM

## 2022-01-24 LAB — BASIC METABOLIC PANEL WITH GFR
BUN/Creatinine Ratio: 28 (calc) — ABNORMAL HIGH (ref 6–22)
BUN: 30 mg/dL — ABNORMAL HIGH (ref 7–25)
CO2: 28 mmol/L (ref 20–32)
Calcium: 10 mg/dL (ref 8.6–10.4)
Chloride: 101 mmol/L (ref 98–110)
Creat: 1.09 mg/dL — ABNORMAL HIGH (ref 0.50–1.05)
Glucose, Bld: 84 mg/dL (ref 65–99)
Potassium: 4.1 mmol/L (ref 3.5–5.3)
Sodium: 140 mmol/L (ref 135–146)
eGFR: 56 mL/min/{1.73_m2} — ABNORMAL LOW (ref >=60)

## 2022-01-24 LAB — MICROALBUMIN / CREATININE URINE RATIO
Creatinine, Urine: 34 mg/dL (ref 20–275)
Microalb Creat Ratio: 109 mcg/mg creat — ABNORMAL HIGH (ref <30)
Microalb, Ur: 3.7 mg/dL

## 2022-01-27 ENCOUNTER — Telehealth: Payer: Self-pay | Admitting: Nurse Practitioner

## 2022-01-27 ENCOUNTER — Encounter: Payer: Self-pay | Admitting: Nurse Practitioner

## 2022-01-27 NOTE — Telephone Encounter (Signed)
Spoke with patient and told he what Katie Cox said on her lab results. Told her if she needed anything to call us back.

## 2022-01-27 NOTE — Telephone Encounter (Signed)
Pt called this morning and says she doesn't understand what we mean by her kidney functions decreasing and would like it explained to her more and what she needs to do to improve them.

## 2022-01-28 ENCOUNTER — Other Ambulatory Visit: Payer: Self-pay | Admitting: Nurse Practitioner

## 2022-01-28 DIAGNOSIS — F419 Anxiety disorder, unspecified: Secondary | ICD-10-CM

## 2022-01-28 MED ORDER — BUSPIRONE HCL 5 MG PO TABS: ORAL_TABLET | ORAL | 1 refills | Status: DC

## 2022-01-30 DIAGNOSIS — M75121 Complete rotator cuff tear or rupture of right shoulder, not specified as traumatic: Secondary | ICD-10-CM | POA: Diagnosis not present

## 2022-02-04 ENCOUNTER — Ambulatory Visit (INDEPENDENT_AMBULATORY_CARE_PROVIDER_SITE_OTHER): Payer: Medicare Other | Admitting: Nurse Practitioner

## 2022-02-04 ENCOUNTER — Other Ambulatory Visit: Payer: Self-pay

## 2022-02-04 ENCOUNTER — Encounter: Payer: Self-pay | Admitting: Nurse Practitioner

## 2022-02-04 VITALS — BP 140/80 | HR 69 | Temp 98.1°F | Ht 66.5 in | Wt 157.0 lb

## 2022-02-04 DIAGNOSIS — J0110 Acute frontal sinusitis, unspecified: Secondary | ICD-10-CM | POA: Diagnosis not present

## 2022-02-04 DIAGNOSIS — R5383 Other fatigue: Secondary | ICD-10-CM | POA: Diagnosis not present

## 2022-02-04 DIAGNOSIS — Z1152 Encounter for screening for COVID-19: Secondary | ICD-10-CM | POA: Diagnosis not present

## 2022-02-04 DIAGNOSIS — R051 Acute cough: Secondary | ICD-10-CM

## 2022-02-04 LAB — POC COVID19 BINAXNOW: SARS Coronavirus 2 Ag: NEGATIVE

## 2022-02-04 MED ORDER — BENZONATATE 100 MG PO CAPS: 100.0000 mg | ORAL_CAPSULE | Freq: Three times a day (TID) | ORAL | 1 refills | Status: DC | PRN

## 2022-02-04 MED ORDER — AZITHROMYCIN 250 MG PO TABS: ORAL_TABLET | ORAL | 1 refills | Status: DC

## 2022-02-04 MED ORDER — PROMETHAZINE-DM 6.25-15 MG/5ML PO SYRP: 5.0000 mL | ORAL_SOLUTION | Freq: Four times a day (QID) | ORAL | 0 refills | Status: DC | PRN

## 2022-02-04 NOTE — Progress Notes (Signed)
Assessment and Plan:  Katie Cox was seen today for an episodic visit.  Diagnoses and all order for this visit:  1. Encounter for screening for COVID-19 Negative  - POC COVID-19  2. Acute non-recurrent frontal sinusitis Stay well hydrated. Zyrtec samples provided.  - azithromycin (ZITHROMAX) 250 MG tablet; Take 2 tablets (500 mg) on Day 1 followed by 1 tablet  (250 mg) daily until complete.  Dispense: 6 each; Refill: 1  3. Acute cough Stay well hydrated. Discussed taking Benzonatate during the day, while working to help prevent increase in sedation. May need to take Promethazine HS as it can cause drowsiness.  - benzonatate (TESSALON PERLES) 100 MG capsule; Take 1 capsule (100 mg total) by mouth 3 (three) times daily as needed for cough.  Dispense: 30 capsule; Refill: 1 - promethazine-dextromethorphan (PROMETHAZINE-DM) 6.25-15 MG/5ML syrup; Take 5 mLs by mouth 4 (four) times daily as needed for cough.  Dispense: 240 mL; Refill: 0  4. Other fatigue Rest.   Continue to monitor for any increase in fever, chills, N/V, difficulty breathing.  Notify office for further evaluation and treatment, questions or concerns if s/s fail to improve. The risks and benefits of my recommendations, as well as other treatment options were discussed with the patient today. Questions were answered.  Report to ER for any difficulty breathing.  Further disposition pending results of labs. Discussed med's effects and SE's.    Over 15 minutes of exam, counseling, chart review, and critical decision making was performed.   Future Appointments  Date Time Provider [place]  06/05/2022 11:00 AM Alycia Rossetti, NP [facility] None  12/15/2022  3:00 PM Alycia Rossetti, NP [facility] None    ------------------------------------------------------------------------------------------------------------------   HPI BP (!) 140/80   Pulse 69   Temp 98.1 F (36.7 C)   Ht 5' 6.5" (1.689 m)   Wt  157 lb (71.2 kg)   SpO2 97%   BMI 24.96 kg/m   Subjective:     Katie Cox is a 66 y.o. female who presents for evaluation of symptoms of a URI, possible sinusitis. Symptoms include congestion, cough described as productive, headache described as achy, nasal congestion, sinus pressure, and chills . Denies wheezing. Onset of symptoms was 2 weeks ago, and has been unchanged since that time. Treatment to date: decongestants and Mucinex all in one .  Worse symptoms is cough and sinus pressure.  Cough keeps her up at night.   Past Medical History:  Diagnosis Date   Abnormal glucose 02/05/2015   Allergy    seasonal   Contact lens/glasses fitting    wears contacts or glasses   GERD (gastroesophageal reflux disease)    Hyperlipidemia    Hypertension    Hyperuricemia 08/13/2014   Irritable bowel syndrome 10/18/2007   Irritable bowel syndrome (IBS)    Medication management 12/26/2013   Peripheral neuropathy    in feet   POLYP, COLON 10/18/2007   PONV (postoperative nausea and vomiting)    nausea, slow to wake up at times   Vitamin D deficiency      Allergies  Allergen Reactions   Alprazolam Other (See Comments)     Unknown reaction per pt   Lexapro [Escitalopram]     Diarrhea/nausea    Prednisone     Only high doses    Current Outpatient Medications on File Prior to Visit  Medication Sig   allopurinol (ZYLOPRIM) 300 MG tablet TAKE 1 TABLET BY MOUTH  DAILY FOR GOUT PREVENTION  Ascorbic Acid (VITAMIN C WITH ROSE HIPS) 1000 MG tablet Take 1,000 mg by mouth daily.   aspirin 81 MG tablet Take 81 mg by mouth every other day.    bumetanide (BUMEX) 2 MG tablet TAKE 1 TABLET BY MOUTH  TWICE DAILY FOR BLOOD  PRESSURE AND FLUID   busPIRone (BUSPAR) 5 MG tablet Take 1 tab up to three times a day as needed for anxiety.   Cholecalciferol (VITAMIN D3) 125 MCG (5000 UT) CAPS Take by mouth daily.   Cyanocobalamin (B-12 PO) Take by mouth daily.   famotidine (PEPCID) 40 MG tablet TAKE 1 TABLET BY  MOUTH AT  BEDTIME TO PREVENT  INDIGESTION AND HEARTBURN   fluticasone (FLONASE) 50 MCG/ACT nasal spray Place 1 spray into both nostrils daily.   lidocaine (LIDODERM) 5 % Place 1 patch onto the skin daily as needed for up to 15 doses. Remove & Discard patch within 12 hours or as directed by MD (Patient not taking: Reported on 01/23/2022)   losartan (COZAAR) 25 MG tablet Start by taking 1/2 tab daily for kidneys/protein; increase to taking whole tab in 1 week if blood pressure tolerates.   Magnesium 250 MG TABS Take 2 tablets by mouth 2 (two) times daily.   Multiple Vitamin (MULTIVITAMIN) tablet Take 1 tablet by mouth daily.   OVER THE COUNTER MEDICATION Takes OTC Allegra for allergies.   potassium chloride SA (KLOR-CON M) 20 MEQ tablet TAKE 1 TABLET BY MOUTH  TWICE DAILY FOR POTASSIUM   rosuvastatin (CRESTOR) 20 MG tablet Take  1 tablet   Daily  for  Cholesterol                                                            /                            TAKE BY MOUTH   Zinc 50 MG CAPS Take by mouth daily.   No current facility-administered medications on file prior to visit.    ROS: all negative except what is noted in the HPI.   Physical Exam:  BP (!) 140/80   Pulse 69   Temp 98.1 F (36.7 C)   Ht 5' 6.5" (1.689 m)   Wt 157 lb (71.2 kg)   SpO2 97%   BMI 24.96 kg/m   General Appearance: NAD.  Awake, conversant and cooperative. Eyes: PERRLA, EOMs intact.  Sclera white.  Conjunctiva without erythema. Sinuses: Frontal/maxillary tenderness.  No nasal discharge. Nares not patent.  ENT/Mouth: Ext aud canals clear.  Bilateral TMs w/DOL and without erythema or bulging. Hearing intact.  Posterior pharynx without swelling or exudate.  Tonsils without swelling or erythema.  Neck: Supple.  No masses, nodules or thyromegaly. Respiratory: Effort is regular with non-labored breathing. Breath sounds are equal bilaterally without rales, rhonchi, wheezing or stridor.  Cardio: RRR with no MRGs. Brisk  peripheral pulses without edema.  Abdomen: Active BS in all four quadrants.  Soft and non-tender without guarding, rebound tenderness, hernias or masses. Lymphatics: Non tender without lymphadenopathy.  Musculoskeletal: Full ROM, 5/5 strength, normal ambulation.  No clubbing or cyanosis. Skin: Appropriate color for ethnicity. Warm without rashes, lesions, ecchymosis, ulcers.  Neuro: CN II-XII grossly normal. Normal muscle tone without cerebellar symptoms and  intact sensation.   Psych: AO X 3,  appropriate mood and affect, insight and judgment.     Darrol Jump, NP 10:25 AM [facility]

## 2022-02-04 NOTE — Patient Instructions (Signed)

## 2022-02-12 ENCOUNTER — Other Ambulatory Visit: Payer: Self-pay

## 2022-02-12 DIAGNOSIS — N1831 Chronic kidney disease, stage 3a: Secondary | ICD-10-CM

## 2022-02-12 MED ORDER — LOSARTAN POTASSIUM 25 MG PO TABS: ORAL_TABLET | ORAL | 0 refills | Status: DC

## 2022-02-19 ENCOUNTER — Encounter: Payer: Self-pay | Admitting: Nurse Practitioner

## 2022-02-19 ENCOUNTER — Ambulatory Visit (INDEPENDENT_AMBULATORY_CARE_PROVIDER_SITE_OTHER): Payer: Medicare Other | Admitting: Nurse Practitioner

## 2022-02-19 VITALS — BP 114/70 | HR 61 | Temp 98.0°F | Resp 17 | Ht 66.5 in | Wt 152.0 lb

## 2022-02-19 DIAGNOSIS — G8929 Other chronic pain: Secondary | ICD-10-CM | POA: Diagnosis not present

## 2022-02-19 DIAGNOSIS — E559 Vitamin D deficiency, unspecified: Secondary | ICD-10-CM | POA: Diagnosis not present

## 2022-02-19 DIAGNOSIS — N1831 Chronic kidney disease, stage 3a: Secondary | ICD-10-CM | POA: Diagnosis not present

## 2022-02-19 DIAGNOSIS — Z01818 Encounter for other preprocedural examination: Secondary | ICD-10-CM

## 2022-02-19 DIAGNOSIS — M25511 Pain in right shoulder: Secondary | ICD-10-CM | POA: Diagnosis not present

## 2022-02-19 DIAGNOSIS — M25619 Stiffness of unspecified shoulder, not elsewhere classified: Secondary | ICD-10-CM | POA: Diagnosis not present

## 2022-02-19 DIAGNOSIS — E66811 Obesity, class 1: Secondary | ICD-10-CM

## 2022-02-19 DIAGNOSIS — I10 Essential (primary) hypertension: Secondary | ICD-10-CM

## 2022-02-19 DIAGNOSIS — E669 Obesity, unspecified: Secondary | ICD-10-CM

## 2022-02-19 NOTE — Progress Notes (Signed)
SURGICAL CLEARANCE   Pre-operative Risk Assessment    1. Preop examination  - Urinalysis w microscopic + reflex cultur  2. Chronic right shoulder pain Continue surgical repair   3. Limited range of motion (ROM) of shoulder Continue current pain medication, treatment.  4. Essential hypertension Discussed DASH (Dietary Approaches to Stop Hypertension) DASH diet is lower in sodium than a typical American diet. Cut back on foods that are high in saturated fat, cholesterol, and trans fats. Eat more whole-grain foods, fish, poultry, and nuts Remain active and exercise as tolerated daily.  Monitor BP at home-Call if greater than 130/80.   5. CKD stage G3a/A2, GFR 45-59 and albumin creatinine ratio 30-299 mg/g (HCC) Discussed how what you eat and drink can aide in kidney protection. Stay well hydrated. Avoid high salt foods. Avoid NSAIDS. Keep BP and BG well controlled.   Take medications as prescribed. Remain active and exercise as tolerated daily. Maintain weight.  Continue to monitor.  6. Vitamin D deficiency Continue supplement. Monitor Vitamin D  7. Obesity (BMI 30.0-34.9) Discussed appropriate BMI Goal of losing 1 lb per month. Diet modification. Physical activity. Encouraged/praised to build confidence.   Patient Name: Katie Cox  DOB: 1955/09/12 MRN: [id number]{  Request for Surgical Clearance{ Procedure:  Right Shoulder Repair/Reverse Arthroplasty   Date of Surgery:  Clearance TBD                         Surgeon:  Dr. Victorino December Surgeon's Group or Practice Name:  [facility] Phone number:  [phone]  Type of Clearance Requested:   Medical  Type of Anesthesia:  General  Additional requests/questions:  Please fax a copy of CPE (2023), EKG, Last 3 mo lab work CBC, CMP, Vit D, including UA, CXR to the surgeon's office.  Over 30 minutes of face to face interview, exam, counseling, chart review and critical decision making was performed Future  Appointments  Date Time Provider [place]  06/05/2022 11:00 AM Alycia Rossetti, NP [facility] None  12/15/2022  3:00 PM Alycia Rossetti, NP [facility] None     Subjective:  Katie Cox is a 66 y.o. female who presents for CPE. She has History of colon polyps; Irritable bowel syndrome; GERD (gastroesophageal reflux disease); Hyperlipidemia, mixed; Essential hypertension; Vitamin D deficiency; Peripheral neuropathy; Hyperuricemia; Abnormal glucose; FHx: heart disease; Anxiety; CKD (chronic kidney disease) stage 3, GFR 30-59 ml/min ([place]); Osteopenia; and Obesity (BMI 30.0-34.9) on their problem list.  Mystic Labo presents to office for evaluation of surgical clearance.    She has R rotator cuff tear, Dr. Victorino December at [facility] planning surgical repair (candidate for reverse arthroplasty) in the next 6 months.   Most recent evaluation 01/30/22 [facility]:  HPI Notes: G-Upper Extremity Reported by patient. Visit For: Follow up Hand Dominance: right Location: right; shoulder (and neck) Duration: months Quality: aching; worsening; she has to type a lot and this tends to make her right shoulder worse Severity: moderate Timing: acute Context: NKI Alleviating Factors: ice; rest; Home exercise program; OTC medication (tylenol) Aggravating Factors: lifting; ROM Associated Symptoms: no numbness; no tingling; stiffness; radiating proximally (right arm) Previous Surgery: none Prior Imaging: x ray; MRI Previous Injections: helped temporarily; her last subacromial injection was on 08/24/21 Previous PT: did not help Work Related: no Notes: she states that she has good days and bad days     BMI is Body mass index is 24.17 kg/m., she has been working on  diet and exercise. Limits red meat.  Wt Readings from Last 3 Encounters:  02/19/22 152 lb (68.9 kg)  02/04/22 157 lb (71.2 kg)  01/23/22 157 lb 6.4 oz (71.4 kg)   She has had elevated blood pressure for  years. Her blood pressure has been  controlled at home, today their BP is BP: 114/70 She does not workout. She denies chest pain, shortness of breath, dizziness.   She reports dependent edema of bil ankles, without fatigue, PND; resolves by AM. She takes bumex daily with potassium. Compression hose during the winter but admits not during the summer, swelling is worse then. She reports following low sodium diet, pushing fluid intake.   She is on cholesterol medication  (rosuvastaitn 20 mg daily) and denies myalgias. Her cholesterol is not at goal. The cholesterol last visit was:   Lab Results  Component Value Date   CHOL 145 12/11/2021   HDL 65 12/11/2021   LDLCALC 60 12/11/2021   TRIG 115 12/11/2021   CHOLHDL 2.2 12/11/2021    She has not been working on diet and exercise for abnormal glucose/hx of prediabetes, and denies polydipsia, polyuria, visual disturbances, vomiting and weight loss. Last A1C in the office was:  Lab Results  Component Value Date   HGBA1C 5.2 12/11/2021   She denies NSAID, gets 64+ fluid ounces of water daily, has reduced bumex from BID to once daily. Last GFR: Lab Results  Component Value Date   EGFR 56 (L) 01/23/2022   EGFR 56 (L) 12/11/2021   EGFR 57 (L) 09/09/2021   Lab Results  Component Value Date   MICRALBCREAT 109 (H) 01/23/2022   Patient is on Vitamin D supplement.   Lab Results  Component Value Date   VD25OH 95 12/11/2021      Medication Review: Current Outpatient Medications on File Prior to Visit  Medication Sig Dispense Refill   allopurinol (ZYLOPRIM) 300 MG tablet TAKE 1 TABLET BY MOUTH  DAILY FOR GOUT PREVENTION 90 tablet 3   Ascorbic Acid (VITAMIN C WITH ROSE HIPS) 1000 MG tablet Take 1,000 mg by mouth daily.     aspirin 81 MG tablet Take 81 mg by mouth every other day.      benzonatate (TESSALON PERLES) 100 MG capsule Take 1 capsule (100 mg total) by mouth 3 (three) times daily as needed for cough. 30 capsule 1   bumetanide (BUMEX) 2 MG tablet TAKE 1 TABLET BY MOUTH   TWICE DAILY FOR BLOOD  PRESSURE AND FLUID 180 tablet 3   busPIRone (BUSPAR) 5 MG tablet Take 1 tab up to three times a day as needed for anxiety. 270 tablet 1   Cholecalciferol (VITAMIN D3) 125 MCG (5000 UT) CAPS Take by mouth daily.     Cyanocobalamin (B-12 PO) Take by mouth daily.     famotidine (PEPCID) 40 MG tablet TAKE 1 TABLET BY MOUTH AT  BEDTIME TO PREVENT  INDIGESTION AND HEARTBURN 90 tablet 3   losartan (COZAAR) 25 MG tablet Start by taking 1/2 tab daily for kidneys/protein; increase to taking whole tab in 1 week if blood pressure tolerates. 90 tablet 0   Magnesium 250 MG TABS Take 2 tablets by mouth 2 (two) times daily.     Multiple Vitamin (MULTIVITAMIN) tablet Take 1 tablet by mouth daily.     OVER THE COUNTER MEDICATION Takes OTC Allegra for allergies.     potassium chloride SA (KLOR-CON M) 20 MEQ tablet TAKE 1 TABLET BY MOUTH  TWICE DAILY FOR POTASSIUM 180 tablet  3   promethazine-dextromethorphan (PROMETHAZINE-DM) 6.25-15 MG/5ML syrup Take 5 mLs by mouth 4 (four) times daily as needed for cough. 240 mL 0   rosuvastatin (CRESTOR) 20 MG tablet Take  1 tablet   Daily  for  Cholesterol                                                            /                            TAKE BY MOUTH 90 tablet 3   Zinc 50 MG CAPS Take by mouth daily.     azithromycin (ZITHROMAX) 250 MG tablet Take 2 tablets (500 mg) on Day 1 followed by 1 tablet  (250 mg) daily until complete. (Patient not taking: Reported on 02/19/2022) 6 each 1   fluticasone (FLONASE) 50 MCG/ACT nasal spray Place 1 spray into both nostrils daily.     lidocaine (LIDODERM) 5 % Place 1 patch onto the skin daily as needed for up to 15 doses. Remove & Discard patch within 12 hours or as directed by MD (Patient not taking: Reported on 01/23/2022) 15 patch 0   No current facility-administered medications on file prior to visit.    Allergies  Allergen Reactions   Alprazolam Other (See Comments)     Unknown reaction per pt   Lexapro  [Escitalopram]     Diarrhea/nausea    Prednisone     Only high doses    Current Problems (verified) Patient Active Problem List   Diagnosis Date Noted   Obesity (BMI 30.0-34.9) 12/11/2021   CKD (chronic kidney disease) stage 3, GFR 30-59 ml/min (HCC) 11/29/2020   Osteopenia 11/29/2020   Anxiety 08/23/2019   FHx: heart disease 04/21/2018   Abnormal glucose 02/05/2015   Hyperuricemia 08/13/2014   GERD (gastroesophageal reflux disease)    Hyperlipidemia, mixed    Essential hypertension    Vitamin D deficiency    Peripheral neuropathy    History of colon polyps 10/18/2007   Irritable bowel syndrome 10/18/2007    Screening Tests Immunization History  Administered Date(s) Administered   Influenza Inj Mdck Quad With Preservative 04/21/2018, 05/18/2019, 06/07/2020   Influenza, High Dose Seasonal PF 06/05/2021   PFIZER(Purple Top)SARS-COV-2 Vaccination 08/30/2019, 09/19/2019, 10/12/2019   PPD Test 01/25/2014, 02/05/2015, 02/27/2016, 04/21/2018, 05/18/2019   Pneumococcal Conjugate-13 08/13/2020   Pneumococcal Polysaccharide-23 11/23/2008, 12/11/2021   Td 11/29/2020   Tdap 11/29/2009   Unspecified SARS-COV-2 Vaccination 09/19/2019   Zoster Recombinat (Shingrix) 09/22/2020   Health Maintenance  Topic Date Due   COVID-19 Vaccine (5 - Mixed Product risk series) 12/07/2019   Zoster Vaccines- Shingrix (2 of 2) 11/17/2020   INFLUENZA VACCINE  01/21/2022   MAMMOGRAM  03/19/2022   COLONOSCOPY (Pts 45-69yr Insurance coverage will need to be confirmed)  08/31/2024   TETANUS/TDAP  11/30/2030   Pneumonia Vaccine 66 Years old  Completed   DEXA SCAN  Completed   Hepatitis C Screening  Completed   HPV VACCINES  Aged Out    Patient Care Team: MUnk Pinto MD as PCP - General (Internal Medicine)  SURGICAL HISTORY She  has a past surgical history that includes goiter removed fro thyroid; Foot surgery; Colonoscopy; Cholecystectomy (04/14/2012); Open reduction internal fixation  (orif) distal radial fracture (  Right, 07/04/2015); carpel tunnel surgery (Right); Wisdom tooth extraction; Eye surgery; and Polypectomy. FAMILY HISTORY Her family history includes Arrhythmia in her mother; Breast cancer in her maternal aunt; COPD in her paternal grandfather; Colon cancer (age of onset: 47) in her father; Colon polyps in her father; Hypertension in her mother; Macular degeneration in her brother. SOCIAL HISTORY She  reports that she has never smoked. She has never used smokeless tobacco. She reports that she does not drink alcohol and does not use drugs.    Objective:     Today's Vitals   02/19/22 1430  BP: 114/70  Pulse: 61  Resp: 17  Temp: 98 F (36.7 C)  SpO2: 99%  Weight: 152 lb (68.9 kg)  Height: 5' 6.5" (1.689 m)   Body mass index is 24.17 kg/m.  General appearance: alert, no distress, WD/WN, female HEENT: normocephalic, sclerae anicteric, TMs pearly, nares patent, no discharge or erythema, pharynx normal Oral cavity: MMM, no lesions Neck: supple, no lymphadenopathy, no thyromegaly, no masses Heart: RRR, normal S1, S2, no murmurs Lungs: CTA bilaterally, no wheezes, rhonchi, or rales Abdomen: +bs, soft, non tender, non distended, no masses, no hepatomegaly, no splenomegaly Musculoskeletal: nontender, no swelling, no obvious deformity. Limited ROM R shoulder due to pain.  Extremities: Bil ankles 1+ edema, no cyanosis, no clubbing Pulses: 2+ symmetric, upper and lower extremities, normal cap refill Neurological: alert, oriented x 3, CN2-12 intact, strength normal upper extremities and lower extremities, sensation normal throughout, DTRs 2+ throughout, no cerebellar signs, gait normal Psychiatric: normal affect, behavior normal, pleasant  GU: defer to GYN Breasts: defer to GYN, getting mammograms  EKG: Sinus bradycardia, NSCPT 11/2021  Darrol Jump, NP 2:57 PM [facility]

## 2022-02-19 NOTE — Patient Instructions (Signed)
Allergic Rhinitis, Adult  Allergic rhinitis is an allergic reaction that affects the mucous membrane inside the nose. The mucous membrane is the tissue that produces mucus. There are two types of allergic rhinitis: Seasonal. This type is also called hay fever and happens only during certain seasons. Perennial. This type can happen at any time of the year. Allergic rhinitis cannot be spread from person to person. This condition can be mild, moderate, or severe. It can develop at any age and may be outgrown. What are the causes? This condition is caused by allergens. These are things that can cause an allergic reaction. Allergens may differ for seasonal allergic rhinitis and perennial allergic rhinitis. Seasonal allergic rhinitis is triggered by pollen. Pollen can come from grasses, trees, and weeds. Perennial allergic rhinitis may be triggered by: Dust mites. Proteins in a pet's urine, saliva, or dander. Dander is dead skin cells from a pet. Smoke, mold, or car fumes. What increases the risk? You are more likely to develop this condition if you have a family history of allergies or other conditions related to allergies, including: Allergic conjunctivitis. This is inflammation of parts of the eyes and eyelids. Asthma. This condition affects the lungs and makes it hard to breathe. Atopic dermatitis or eczema. This is long term (chronic) inflammation of the skin. Food allergies. What are the signs or symptoms? Symptoms of this condition include: Sneezing or coughing. A stuffy nose (nasal congestion), itchy nose, or nasal discharge. Itchy eyes and tearing of the eyes. A feeling of mucus dripping down the back of your throat (postnasal drip). Trouble sleeping. Tiredness or fatigue. Headache. Sore throat. How is this diagnosed? This condition may be diagnosed with your symptoms, medical history, and physical exam. Your health care provider may check for related conditions, such  as: Asthma. Pink eye. This is eye inflammation caused by infection (conjunctivitis). Ear infection. Upper respiratory infection. This is an infection in the nose, throat, or upper airways. You may also have tests to find out which allergens trigger your symptoms. These may include skin tests or blood tests. How is this treated? There is no cure for this condition, but treatment can help control symptoms. Treatment may include: Taking medicines that block allergy symptoms, such as corticosteroids and antihistamines. Medicine may be given as a shot, nasal spray, or pill. Avoiding any allergens. Being exposed again and again to tiny amounts of allergens to help you build a defense against allergens (immunotherapy). This is done if other treatments have not helped. It may include: Allergy shots. These are injected medicines that have small amounts of allergen in them. Sublingual immunotherapy. This involves taking small doses of a medicine with allergen in it under your tongue. If these treatments do not work, your health care provider may prescribe newer, stronger medicines. Follow these instructions at home: Avoiding allergens Find out what you are allergic to and avoid those allergens. These are some things you can do to help avoid allergens: If you have perennial allergies: Replace carpet with wood, tile, or vinyl flooring. Carpet can trap dander and dust. Do not smoke. Do not allow smoking in your home. Change your heating and air conditioning filters at least once a month. If you have seasonal allergies, take these steps during allergy season: Keep windows closed as much as possible. Plan outdoor activities when pollen counts are lowest. Check pollen counts before you plan outdoor activities. When coming indoors, change clothing and shower before sitting on furniture or bedding. If you have a pet   in the house that produces allergens: Keep the pet out of the bedroom. Vacuum, sweep, and  dust regularly. General instructions Take over-the-counter and prescription medicines only as told by your health care provider. Drink enough fluid to keep your urine pale yellow. Keep all follow-up visits as told by your health care provider. This is important. Where to find more information American Academy of Allergy, Asthma & Immunology: www.aaaai.org Contact a health care provider if: You have a fever. You develop a cough that does not go away. You make whistling sounds when you breathe (wheeze). Your symptoms slow you down or stop you from doing your normal activities each day. Get help right away if: You have shortness of breath. This symptom may represent a serious problem that is an emergency. Do not wait to see if the symptom will go away. Get medical help right away. Call your local emergency services (911 in the [place] Do not drive yourself to the hospital. Summary Allergic rhinitis may be managed by taking medicines as directed and avoiding allergens. If you have seasonal allergies, keep windows closed as much as possible during allergy season. Contact your health care provider if you develop a fever or a cough that does not go away. This information is not intended to replace advice given to you by your health care provider. Make sure you discuss any questions you have with your health care provider. Document Revised: 07/29/2019 Document Reviewed: 06/07/2019 Elsevier Patient Education  [place].

## 2022-02-20 LAB — URINALYSIS W MICROSCOPIC + REFLEX CULTURE
Bacteria, UA: NONE SEEN /HPF
Bilirubin Urine: NEGATIVE
Glucose, UA: NEGATIVE
Hgb urine dipstick: NEGATIVE
Hyaline Cast: NONE SEEN /LPF
Ketones, ur: NEGATIVE
Leukocyte Esterase: NEGATIVE
Nitrites, Initial: NEGATIVE
Protein, ur: NEGATIVE
RBC / HPF: NONE SEEN /HPF (ref 0–2)
Specific Gravity, Urine: 1.006 (ref 1.001–1.035)
Squamous Epithelial / HPF: NONE SEEN /HPF (ref <=5)
WBC, UA: NONE SEEN /HPF (ref 0–5)
pH: 7 (ref 5.0–8.0)

## 2022-02-20 LAB — NO CULTURE INDICATED

## 2022-02-26 ENCOUNTER — Other Ambulatory Visit: Payer: Self-pay | Admitting: Obstetrics and Gynecology

## 2022-02-26 DIAGNOSIS — Z1231 Encounter for screening mammogram for malignant neoplasm of breast: Secondary | ICD-10-CM

## 2022-02-27 ENCOUNTER — Telehealth: Payer: Self-pay | Admitting: Nurse Practitioner

## 2022-02-27 NOTE — Telephone Encounter (Signed)
[facility] called back, originally the provider seeing her didn't need a CXR for pre-op surgery clearance. Now they need one and want Katie Cox to order it, I included the number in the encounter.

## 2022-02-28 ENCOUNTER — Other Ambulatory Visit: Payer: Self-pay | Admitting: Nurse Practitioner

## 2022-02-28 DIAGNOSIS — Z01818 Encounter for other preprocedural examination: Secondary | ICD-10-CM

## 2022-02-28 NOTE — Telephone Encounter (Signed)
Patient notified

## 2022-03-02 ENCOUNTER — Other Ambulatory Visit: Payer: Self-pay | Admitting: Nurse Practitioner

## 2022-03-02 DIAGNOSIS — E790 Hyperuricemia without signs of inflammatory arthritis and tophaceous disease: Secondary | ICD-10-CM

## 2022-03-02 DIAGNOSIS — K219 Gastro-esophageal reflux disease without esophagitis: Secondary | ICD-10-CM

## 2022-03-03 ENCOUNTER — Ambulatory Visit
Admission: RE | Admit: 2022-03-03 | Discharge: 2022-03-03 | Disposition: A | Discharge status: Home / Self Care | Payer: Medicare Other | Source: Ambulatory Visit | Attending: Nurse Practitioner | Admitting: Nurse Practitioner

## 2022-03-03 DIAGNOSIS — R918 Other nonspecific abnormal finding of lung field: Secondary | ICD-10-CM | POA: Diagnosis not present

## 2022-03-03 DIAGNOSIS — Z01818 Encounter for other preprocedural examination: Secondary | ICD-10-CM

## 2022-03-03 DIAGNOSIS — M47814 Spondylosis without myelopathy or radiculopathy, thoracic region: Secondary | ICD-10-CM | POA: Diagnosis not present

## 2022-03-17 ENCOUNTER — Other Ambulatory Visit: Payer: Self-pay | Admitting: Internal Medicine

## 2022-03-17 DIAGNOSIS — E782 Mixed hyperlipidemia: Secondary | ICD-10-CM

## 2022-03-21 ENCOUNTER — Ambulatory Visit
Admission: RE | Admit: 2022-03-21 | Discharge: 2022-03-21 | Disposition: A | Discharge status: Home / Self Care | Payer: Medicare Other | Source: Ambulatory Visit | Attending: Obstetrics and Gynecology | Admitting: Obstetrics and Gynecology

## 2022-03-21 DIAGNOSIS — Z1231 Encounter for screening mammogram for malignant neoplasm of breast: Secondary | ICD-10-CM | POA: Diagnosis not present

## 2022-03-25 ENCOUNTER — Other Ambulatory Visit: Payer: Self-pay | Admitting: Obstetrics and Gynecology

## 2022-03-25 DIAGNOSIS — R928 Other abnormal and inconclusive findings on diagnostic imaging of breast: Secondary | ICD-10-CM

## 2022-04-07 ENCOUNTER — Telehealth: Payer: Self-pay | Admitting: Nurse Practitioner

## 2022-04-07 NOTE — Telephone Encounter (Signed)
Decreasing kidney function with U/S that shows Renal cortical thinning and increased echogenicity, most consistent with medical renal disease. Would like nephrology to determine if any further testing is needed

## 2022-04-07 NOTE — Telephone Encounter (Signed)
Patient states that she received a call from the kidney center about scheduling a consult  but she is unsure as to why? Would like a call from the nurse to clarify why she needs this consult.

## 2022-04-07 NOTE — Telephone Encounter (Signed)
Patient aware and said she made an appointment. It is not until December.

## 2022-04-09 ENCOUNTER — Encounter: Payer: Self-pay | Admitting: Nurse Practitioner

## 2022-04-21 ENCOUNTER — Ambulatory Visit: Payer: Medicare Other

## 2022-04-21 ENCOUNTER — Ambulatory Visit
Admission: RE | Admit: 2022-04-21 | Discharge: 2022-04-21 | Disposition: A | Discharge status: Home / Self Care | Payer: Medicare Other | Source: Ambulatory Visit | Attending: Obstetrics and Gynecology | Admitting: Obstetrics and Gynecology

## 2022-04-21 DIAGNOSIS — R92323 Mammographic fibroglandular density, bilateral breasts: Secondary | ICD-10-CM | POA: Diagnosis not present

## 2022-04-21 DIAGNOSIS — R928 Other abnormal and inconclusive findings on diagnostic imaging of breast: Secondary | ICD-10-CM | POA: Diagnosis not present

## 2022-05-03 ENCOUNTER — Other Ambulatory Visit: Payer: Self-pay | Admitting: Nurse Practitioner

## 2022-05-03 DIAGNOSIS — N1831 Chronic kidney disease, stage 3a: Secondary | ICD-10-CM

## 2022-05-07 DIAGNOSIS — M66821 Spontaneous rupture of other tendons, right upper arm: Secondary | ICD-10-CM | POA: Diagnosis not present

## 2022-05-07 DIAGNOSIS — M7551 Bursitis of right shoulder: Secondary | ICD-10-CM | POA: Diagnosis not present

## 2022-05-07 DIAGNOSIS — M7541 Impingement syndrome of right shoulder: Secondary | ICD-10-CM | POA: Diagnosis not present

## 2022-05-07 DIAGNOSIS — M948X1 Other specified disorders of cartilage, shoulder: Secondary | ICD-10-CM | POA: Diagnosis not present

## 2022-05-07 DIAGNOSIS — S46191A Other injury of muscle, fascia and tendon of long head of biceps, right arm, initial encounter: Secondary | ICD-10-CM | POA: Diagnosis not present

## 2022-05-07 DIAGNOSIS — M24111 Other articular cartilage disorders, right shoulder: Secondary | ICD-10-CM | POA: Diagnosis not present

## 2022-05-07 DIAGNOSIS — M75111 Incomplete rotator cuff tear or rupture of right shoulder, not specified as traumatic: Secondary | ICD-10-CM | POA: Diagnosis not present

## 2022-05-07 DIAGNOSIS — M75121 Complete rotator cuff tear or rupture of right shoulder, not specified as traumatic: Secondary | ICD-10-CM | POA: Diagnosis not present

## 2022-05-21 DIAGNOSIS — M25511 Pain in right shoulder: Secondary | ICD-10-CM | POA: Diagnosis not present

## 2022-05-26 DIAGNOSIS — Z7982 Long term (current) use of aspirin: Secondary | ICD-10-CM | POA: Diagnosis not present

## 2022-05-26 DIAGNOSIS — Z4789 Encounter for other orthopedic aftercare: Secondary | ICD-10-CM | POA: Diagnosis not present

## 2022-05-26 DIAGNOSIS — K219 Gastro-esophageal reflux disease without esophagitis: Secondary | ICD-10-CM | POA: Diagnosis not present

## 2022-05-28 DIAGNOSIS — K219 Gastro-esophageal reflux disease without esophagitis: Secondary | ICD-10-CM | POA: Diagnosis not present

## 2022-05-28 DIAGNOSIS — Z7982 Long term (current) use of aspirin: Secondary | ICD-10-CM | POA: Diagnosis not present

## 2022-05-28 DIAGNOSIS — Z4789 Encounter for other orthopedic aftercare: Secondary | ICD-10-CM | POA: Diagnosis not present

## 2022-06-03 DIAGNOSIS — Z4789 Encounter for other orthopedic aftercare: Secondary | ICD-10-CM | POA: Diagnosis not present

## 2022-06-03 DIAGNOSIS — Z7982 Long term (current) use of aspirin: Secondary | ICD-10-CM | POA: Diagnosis not present

## 2022-06-03 DIAGNOSIS — K219 Gastro-esophageal reflux disease without esophagitis: Secondary | ICD-10-CM | POA: Diagnosis not present

## 2022-06-05 ENCOUNTER — Ambulatory Visit: Payer: Medicare Other | Admitting: Nurse Practitioner

## 2022-06-05 DIAGNOSIS — K219 Gastro-esophageal reflux disease without esophagitis: Secondary | ICD-10-CM | POA: Diagnosis not present

## 2022-06-05 DIAGNOSIS — Z4789 Encounter for other orthopedic aftercare: Secondary | ICD-10-CM | POA: Diagnosis not present

## 2022-06-05 DIAGNOSIS — Z7982 Long term (current) use of aspirin: Secondary | ICD-10-CM | POA: Diagnosis not present

## 2022-06-09 DIAGNOSIS — Z7982 Long term (current) use of aspirin: Secondary | ICD-10-CM | POA: Diagnosis not present

## 2022-06-09 DIAGNOSIS — K219 Gastro-esophageal reflux disease without esophagitis: Secondary | ICD-10-CM | POA: Diagnosis not present

## 2022-06-09 DIAGNOSIS — Z4789 Encounter for other orthopedic aftercare: Secondary | ICD-10-CM | POA: Diagnosis not present

## 2022-06-10 DIAGNOSIS — K219 Gastro-esophageal reflux disease without esophagitis: Secondary | ICD-10-CM | POA: Diagnosis not present

## 2022-06-10 DIAGNOSIS — Z4789 Encounter for other orthopedic aftercare: Secondary | ICD-10-CM | POA: Diagnosis not present

## 2022-06-10 DIAGNOSIS — Z7982 Long term (current) use of aspirin: Secondary | ICD-10-CM | POA: Diagnosis not present

## 2022-06-18 DIAGNOSIS — Z7982 Long term (current) use of aspirin: Secondary | ICD-10-CM | POA: Diagnosis not present

## 2022-06-18 DIAGNOSIS — K219 Gastro-esophageal reflux disease without esophagitis: Secondary | ICD-10-CM | POA: Diagnosis not present

## 2022-06-18 DIAGNOSIS — Z4789 Encounter for other orthopedic aftercare: Secondary | ICD-10-CM | POA: Diagnosis not present

## 2022-06-19 NOTE — Progress Notes (Signed)
MEDICARE ANNUAL WELLNESS VISIT AND FOLLOW UP  Assessment:   Katie Cox was seen today for medicare wellness.  Diagnoses and all orders for this visit:  Encounter for Medicare Annual Wellness Visit Yearly  Essential hypertension Monitor blood pressure at home; call if consistently over 130/80 Continue DASH diet.   Reminder to go to the ER if any CP, SOB, nausea, dizziness, severe HA, changes vision/speech, left arm numbness and tingling and jaw pain. -     CBC with Differential/Platelet -     COMPLETE METABOLIC PANEL WITH GFR  Hyperlipidemia, mixed Continue medications: rosuvastatin 67m Discussed dietary and exercise modifications Low fat diet -     Lipid panel  Abnormal glucose Discussed dietary and exercise modifications -    CMP  Vitamin D deficiency Continue supplementation to maintain goal of 70-100  Gastroesophageal reflux disease, unspecified whether esophagitis present Doing well at this time Continue: famotidine 461mDiet discussed Monitor for triggers Avoid food with high acid content Avoid excessive cafeine Increase water intake  BMI 24.0-24.9, adult Discussed dietary and exercise modifications  Estrogen deficiency MGM & DEXA UTD  Anxiety Doing well at this time Continue to monitor  Medication management - Magnesium  Hyperuricemia Continue allopurinol 30041maily No recent flares Discussed dietary modifications Continue to monitor  Stage 3a chronic kidney disease (HCC) Increase fluids  Avoid NSAIDS Blood pressure control Monitor sugars  Will continue to monitor  Edema bilateral lower extremities Has Bumex 2mg10mN to take potassium wit this 20me61make one tablet 1 time a day as needed.  S/P Right Rotator Cuff Repair Continue exercises and follow with orthopedics  Over 40 minutes of face to face interview, exam, counseling, chart review and critical decision making was performed Future Appointments  Date Time Provider DeparCanon4/2024  3:00 PM WilkiAlycia RossettiGAAM-GAAIM None  03/24/2023 11:00 AM WilkiAlycia RossettiGAAM-GAAIM None     Plan:   During the course of the visit the patient was educated and counseled about appropriate screening and preventive services including:   Pneumococcal vaccine  Prevnar 13 Influenza vaccine Td vaccine Screening electrocardiogram Bone densitometry screening Colorectal cancer screening Diabetes screening Glaucoma screening Nutrition counseling  Advanced directives: requested   Subjective:  Katie Cox 66 y.31 female who presents for Medicare Annual Wellness Visit and 3 month follow up.   Right rotator cuff surgery was done 05/07/22 with Dr. RogerStann Mainland is still doing PT, just got out of sling.   Edema has been improved wince she was out of work for surgery, she sits a lot at work   BMI is Body mass index is 25.37 kg/m., she has not been working on diet and exercise. Wt Readings from Last 3 Encounters:  06/20/22 159 lb 9.6 oz (72.4 kg)  02/19/22 152 lb (68.9 kg)  02/04/22 157 lb (71.2 kg)     She has had elevated blood pressure for  years. Her blood pressure has been controlled at home, today their BP is BP: 132/78 BP Readings from Last 3 Encounters:  06/20/22 132/78  02/19/22 114/70  02/04/22 (!) 140/80  She does not workout. She denies chest pain, shortness of breath, dizziness.   She is not on cholesterol medication and denies myalgias. Her cholesterol is not at goal. The cholesterol last visit was:   Lab Results  Component Value Date   CHOL 145 12/11/2021   HDL 65 12/11/2021   LDLCALC 60 12/11/2021   TRIG 115 12/11/2021  CHOLHDL 2.2 12/11/2021    She has not been working on diet and exercise for abnormal glucose/prediabetes, and denies polydipsia, polyuria, visual disturbances, vomiting and weight loss. Last A1C in the office was:  Lab Results  Component Value Date   HGBA1C 5.2 12/11/2021   She is drinking lots of  water. Last GFR: Lab Results  Component Value Date   EGFR 56 (L) 01/23/2022    Patient is on Vitamin D supplement.   Lab Results  Component Value Date   VD25OH 95 12/11/2021      Medication Review: Current Outpatient Medications on File Prior to Visit  Medication Sig Dispense Refill   allopurinol (ZYLOPRIM) 300 MG tablet TAKE 1 TABLET BY MOUTH DAILY FOR GOUT PREVENTION 100 tablet 2   Ascorbic Acid (VITAMIN C WITH ROSE HIPS) 1000 MG tablet Take 1,000 mg by mouth daily.     aspirin 81 MG tablet Take 81 mg by mouth every other day.      bumetanide (BUMEX) 2 MG tablet TAKE 1 TABLET BY MOUTH  TWICE DAILY FOR BLOOD  PRESSURE AND FLUID 180 tablet 3   busPIRone (BUSPAR) 5 MG tablet Take 1 tab up to three times a day as needed for anxiety. 270 tablet 1   Cholecalciferol (VITAMIN D3) 125 MCG (5000 UT) CAPS Take by mouth daily.     Cyanocobalamin (B-12 PO) Take by mouth daily.     famotidine (PEPCID) 40 MG tablet TAKE 1 TABLET BY MOUTH AT  BEDTIME TO PREVENT INDIGESTION  AND HEARTBURN 100 tablet 2   losartan (COZAAR) 25 MG tablet TAKE ONE-HALF TABLET BY MOUTH  DAILY FOR KIDNEYS/PROTEIN,  INCREASE TO TAKING 1 TABLET IN 1 WEEK IF BLOOD PRESSURE TOLERATES 90 tablet 3   Magnesium 250 MG TABS Take 2 tablets by mouth 2 (two) times daily.     Multiple Vitamin (MULTIVITAMIN) tablet Take 1 tablet by mouth daily.     OVER THE COUNTER MEDICATION Takes OTC Allegra for allergies.     potassium chloride SA (KLOR-CON M) 20 MEQ tablet TAKE 1 TABLET BY MOUTH  TWICE DAILY FOR POTASSIUM 180 tablet 3   rosuvastatin (CRESTOR) 20 MG tablet TAKE 1 TABLET BY MOUTH DAILY FOR CHOLESTEROL 100 tablet 2   Zinc 50 MG CAPS Take by mouth daily.     azithromycin (ZITHROMAX) 250 MG tablet Take 2 tablets (500 mg) on Day 1 followed by 1 tablet  (250 mg) daily until complete. (Patient not taking: Reported on 02/19/2022) 6 each 1   benzonatate (TESSALON PERLES) 100 MG capsule Take 1 capsule (100 mg total) by mouth 3 (three) times  daily as needed for cough. (Patient not taking: Reported on 06/20/2022) 30 capsule 1   fluticasone (FLONASE) 50 MCG/ACT nasal spray Place 1 spray into both nostrils daily.     lidocaine (LIDODERM) 5 % Place 1 patch onto the skin daily as needed for up to 15 doses. Remove & Discard patch within 12 hours or as directed by MD (Patient not taking: Reported on 01/23/2022) 15 patch 0   promethazine-dextromethorphan (PROMETHAZINE-DM) 6.25-15 MG/5ML syrup Take 5 mLs by mouth 4 (four) times daily as needed for cough. (Patient not taking: Reported on 06/20/2022) 240 mL 0   No current facility-administered medications on file prior to visit.    Allergies  Allergen Reactions   Alprazolam Other (See Comments)     Unknown reaction per pt   Lexapro [Escitalopram]     Diarrhea/nausea    Prednisone     Only high  doses    Current Problems (verified) Patient Active Problem List   Diagnosis Date Noted   Obesity (BMI 30.0-34.9) 12/11/2021   CKD (chronic kidney disease) stage 3, GFR 30-59 ml/min (HCC) 11/29/2020   Osteopenia 11/29/2020   Anxiety 08/23/2019   FHx: heart disease 04/21/2018   Abnormal glucose 02/05/2015   Hyperuricemia 08/13/2014   GERD (gastroesophageal reflux disease)    Hyperlipidemia, mixed    Essential hypertension    Vitamin D deficiency    Peripheral neuropathy    History of colon polyps 10/18/2007   Irritable bowel syndrome 10/18/2007    Screening Tests Immunization History  Administered Date(s) Administered   Influenza Inj Mdck Quad With Preservative 04/21/2018, 05/18/2019, 06/07/2020   Influenza, High Dose Seasonal PF 06/05/2021, 05/21/2022   PFIZER(Purple Top)SARS-COV-2 Vaccination 08/30/2019, 09/19/2019, 10/12/2019   PPD Test 01/25/2014, 02/05/2015, 02/27/2016, 04/21/2018, 05/18/2019   Pneumococcal Conjugate-13 08/13/2020   Pneumococcal Polysaccharide-23 11/23/2008, 12/11/2021   Td 11/29/2020   Tdap 11/29/2009   Unspecified SARS-COV-2 Vaccination 09/19/2019   Zoster  Recombinat (Shingrix) 09/22/2020   Health Maintenance  Topic Date Due   COVID-19 Vaccine (5 - 2023-24 season) 07/06/2022 (Originally 02/21/2022)   Zoster Vaccines- Shingrix (2 of 2) 09/19/2022 (Originally 11/17/2020)   MAMMOGRAM  04/22/2023   Medicare Annual Wellness (AWV)  06/21/2023   COLONOSCOPY (Pts 45-2yr Insurance coverage will need to be confirmed)  08/31/2024   DTaP/Tdap/Td (3 - Td or Tdap) 11/30/2030   Pneumonia Vaccine 66 Years old  Completed   INFLUENZA VACCINE  Completed   DEXA SCAN  Completed   Hepatitis C Screening  Completed   HPV VACCINES  Aged Out     Names of Other Physician/Practitioners you currently use: 1. [facility] here for primary care 2. [facility] 3. Dental Exam, Dr. MLear Ng2022 Patient Care Team: MUnk Pinto MD as PCP - General (Internal Medicine)  SURGICAL HISTORY She  has a past surgical history that includes goiter removed fro thyroid; Foot surgery; Colonoscopy; Cholecystectomy (04/14/2012); Open reduction internal fixation (orif) distal radial fracture (Right, 07/04/2015); carpel tunnel surgery (Right); Wisdom tooth extraction; Eye surgery; and Polypectomy. FAMILY HISTORY Her family history includes Arrhythmia in her mother; Breast cancer in her maternal aunt; COPD in her paternal grandfather; Colon cancer (age of onset: 370 in her father; Colon polyps in her father; Hypertension in her mother; Macular degeneration in her brother. SOCIAL HISTORY She  reports that she has never smoked. She has never used smokeless tobacco. She reports that she does not drink alcohol and does not use drugs.   MEDICARE WELLNESS OBJECTIVES: Physical activity: Current Exercise Habits: Home exercise routine, Type of exercise: walking, Time (Minutes): 30, Frequency (Times/Week): 3, Weekly Exercise (Minutes/Week): 90, Exercise limited by: None identified Cardiac risk factors: Cardiac Risk Factors include:  advanced age (>553m, >6>92omen);dyslipidemia;hypertension;sedentary lifestyle Depression/mood screen:      06/20/2022   11:12 AM  Depression screen PHQ 2/9  Decreased Interest 0  Down, Depressed, Hopeless 0  PHQ - 2 Score 0    ADLs:     06/20/2022   11:12 AM 09/09/2021   10:08 PM  In your present state of health, do you have any difficulty performing the following activities:  Hearing? 0 0  Vision? 0 0  Difficulty concentrating or making decisions? 0 0  Walking or climbing stairs? 0 0  Dressing or bathing? 0 0  Doing errands, shopping? 0 0     Cognitive Testing  Alert? Yes  Normal Appearance?Yes  Oriented to person? Yes  Place? Yes   Time? Yes  Recall of three objects?  Yes  Can perform simple calculations? Yes  Displays appropriate judgment?Yes  Can read the correct time from a watch face?Yes  EOL planning: Does Patient Have a Medical Advance Directive?: No Would patient like information on creating a medical advance directive?: No - Patient declined  Review of Systems  Constitutional:  Negative for chills, fever and weight loss.  HENT:  Negative for congestion and hearing loss.   Eyes:  Negative for blurred vision and double vision.  Respiratory:  Negative for cough and shortness of breath.   Cardiovascular:  Negative for chest pain, palpitations, orthopnea and leg swelling.  Gastrointestinal:  Negative for abdominal pain, constipation, diarrhea, heartburn, nausea and vomiting.  Genitourinary: Negative.   Musculoskeletal:  Positive for joint pain (right shoulder with decreased ROM since right rotator cuff surgery). Negative for falls and myalgias.  Skin:  Negative for rash.  Neurological:  Negative for dizziness, tingling, tremors, loss of consciousness and headaches.  Psychiatric/Behavioral:  Negative for depression, memory loss and suicidal ideas.      Objective:     Today's Vitals   06/20/22 1056  BP: 132/78  Pulse: (!) 56  Temp: (!) 97.3 F (36.3 C)   SpO2: 97%  Weight: 159 lb 9.6 oz (72.4 kg)  Height: 5' 6.5" (1.689 m)   Body mass index is 25.37 kg/m.  General appearance: alert, no distress, WD/WN, female HEENT: normocephalic, sclerae anicteric, TMs pearly, nares patent, no discharge or erythema, pharynx normal Oral cavity: MMM, no lesions Neck: supple, no lymphadenopathy, no thyromegaly, no masses Heart: RRR, normal S1, S2, no murmurs Lungs: CTA bilaterally, no wheezes, rhonchi, or rales Abdomen: +bs, soft, non tender, non distended, no masses, no hepatomegaly, no splenomegaly Musculoskeletal: nontender, no swelling, no obvious deformity Limited ROM of right shoulder Extremities: no edema, no cyanosis, no clubbing Pulses: 2+ symmetric, upper and lower extremities, normal cap refill Neurological: alert, oriented x 3, CN2-12 intact, strength normal upper extremities and lower extremities, sensation normal throughout, DTRs 2+ throughout, no cerebellar signs, gait normal Psychiatric: normal affect, behavior normal, pleasant      Medicare Attestation I have personally reviewed: The patient's medical and social history Their use of alcohol, tobacco or illicit drugs Their current medications and supplements The patient's functional ability including ADLs,fall risks, home safety risks, cognitive, and hearing and visual impairment Diet and physical activities Evidence for depression or mood disorders  The patient's weight, height, BMI, and visual acuity have been recorded in the chart.  I have made referrals, counseling, and provided education to the patient based on review of the above and I have provided the patient with a written personalized care plan for preventive services.      Magda Bernheim ANP-C  [name]  06/20/2022

## 2022-06-20 ENCOUNTER — Ambulatory Visit (INDEPENDENT_AMBULATORY_CARE_PROVIDER_SITE_OTHER): Payer: Medicare Other | Admitting: Nurse Practitioner

## 2022-06-20 ENCOUNTER — Encounter: Payer: Self-pay | Admitting: Nurse Practitioner

## 2022-06-20 VITALS — BP 132/78 | HR 56 | Temp 97.3°F | Ht 66.5 in | Wt 159.6 lb

## 2022-06-20 DIAGNOSIS — I10 Essential (primary) hypertension: Secondary | ICD-10-CM | POA: Diagnosis not present

## 2022-06-20 DIAGNOSIS — R7309 Other abnormal glucose: Secondary | ICD-10-CM

## 2022-06-20 DIAGNOSIS — K219 Gastro-esophageal reflux disease without esophagitis: Secondary | ICD-10-CM

## 2022-06-20 DIAGNOSIS — E782 Mixed hyperlipidemia: Secondary | ICD-10-CM | POA: Diagnosis not present

## 2022-06-20 DIAGNOSIS — E559 Vitamin D deficiency, unspecified: Secondary | ICD-10-CM | POA: Diagnosis not present

## 2022-06-20 DIAGNOSIS — E790 Hyperuricemia without signs of inflammatory arthritis and tophaceous disease: Secondary | ICD-10-CM

## 2022-06-20 DIAGNOSIS — Z9889 Other specified postprocedural states: Secondary | ICD-10-CM | POA: Diagnosis not present

## 2022-06-20 DIAGNOSIS — R600 Localized edema: Secondary | ICD-10-CM

## 2022-06-20 DIAGNOSIS — F419 Anxiety disorder, unspecified: Secondary | ICD-10-CM

## 2022-06-20 DIAGNOSIS — Z0001 Encounter for general adult medical examination with abnormal findings: Secondary | ICD-10-CM

## 2022-06-20 DIAGNOSIS — Z6824 Body mass index (BMI) 24.0-24.9, adult: Secondary | ICD-10-CM

## 2022-06-20 DIAGNOSIS — R6889 Other general symptoms and signs: Secondary | ICD-10-CM | POA: Diagnosis not present

## 2022-06-20 DIAGNOSIS — E2839 Other primary ovarian failure: Secondary | ICD-10-CM

## 2022-06-20 DIAGNOSIS — N1831 Chronic kidney disease, stage 3a: Secondary | ICD-10-CM | POA: Diagnosis not present

## 2022-06-20 DIAGNOSIS — Z79899 Other long term (current) drug therapy: Secondary | ICD-10-CM

## 2022-06-20 NOTE — Patient Instructions (Signed)

## 2022-06-21 LAB — CBC WITH DIFFERENTIAL/PLATELET
Absolute Monocytes: 604 cells/uL (ref 200–950)
Basophils Absolute: 137 cells/uL (ref 0–200)
Basophils Relative: 1.2 %
Eosinophils Absolute: 160 cells/uL (ref 15–500)
Eosinophils Relative: 1.4 %
HCT: 39.3 % (ref 35.0–45.0)
Hemoglobin: 13.9 g/dL (ref 11.7–15.5)
Lymphs Abs: 3238 cells/uL (ref 850–3900)
MCH: 33.9 pg — ABNORMAL HIGH (ref 27.0–33.0)
MCHC: 35.4 g/dL (ref 32.0–36.0)
MCV: 95.9 fL (ref 80.0–100.0)
MPV: 14 fL — ABNORMAL HIGH (ref 7.5–12.5)
Monocytes Relative: 5.3 %
Neutro Abs: 7262 cells/uL (ref 1500–7800)
Neutrophils Relative %: 63.7 %
Platelets: 282 10*3/uL (ref 140–400)
RBC: 4.1 10*6/uL (ref 3.80–5.10)
RDW: 12.3 % (ref 11.0–15.0)
Total Lymphocyte: 28.4 %
WBC: 11.4 10*3/uL — ABNORMAL HIGH (ref 3.8–10.8)

## 2022-06-21 LAB — COMPLETE METABOLIC PANEL WITH GFR
AG Ratio: 1.7 (calc) (ref 1.0–2.5)
ALT: 24 U/L (ref 6–29)
AST: 30 U/L (ref 10–35)
Albumin: 4.7 g/dL (ref 3.6–5.1)
Alkaline phosphatase (APISO): 75 U/L (ref 37–153)
BUN/Creatinine Ratio: 24 (calc) — ABNORMAL HIGH (ref 6–22)
BUN: 26 mg/dL — ABNORMAL HIGH (ref 7–25)
CO2: 25 mmol/L (ref 20–32)
Calcium: 10.4 mg/dL (ref 8.6–10.4)
Chloride: 103 mmol/L (ref 98–110)
Creat: 1.07 mg/dL — ABNORMAL HIGH (ref 0.50–1.05)
Globulin: 2.7 g/dL (calc) (ref 1.9–3.7)
Glucose, Bld: 82 mg/dL (ref 65–99)
Potassium: 4.9 mmol/L (ref 3.5–5.3)
Sodium: 139 mmol/L (ref 135–146)
Total Bilirubin: 0.9 mg/dL (ref 0.2–1.2)
Total Protein: 7.4 g/dL (ref 6.1–8.1)
eGFR: 57 mL/min/{1.73_m2} — ABNORMAL LOW (ref >=60)

## 2022-06-21 LAB — LIPID PANEL
Cholesterol: 153 mg/dL (ref <200)
HDL: 64 mg/dL (ref >=50)
LDL Cholesterol (Calc): 65 mg/dL (calc)
Non-HDL Cholesterol (Calc): 89 mg/dL (calc) (ref <130)
Total CHOL/HDL Ratio: 2.4 (calc) (ref <5.0)
Triglycerides: 162 mg/dL — ABNORMAL HIGH (ref <150)

## 2022-06-21 LAB — MAGNESIUM: Magnesium: 2.2 mg/dL (ref 1.5–2.5)

## 2022-06-23 ENCOUNTER — Encounter: Payer: Self-pay | Admitting: Nurse Practitioner

## 2022-06-24 ENCOUNTER — Telehealth: Payer: Self-pay | Admitting: Nurse Practitioner

## 2022-06-24 NOTE — Telephone Encounter (Signed)
Pt's son is concerned about her memory state, says he would like to speak to a nurse or [facility] about it bc bringing it to her attention has not been working. Wanting to know how he or we can go about getting her in for an appt without upsetting her.

## 2022-06-26 NOTE — Telephone Encounter (Signed)
Spoke with Gerald Stabs, patients son, about his concerns. Told him he needed to schedule Hassan Rowan an appointment with with [facility] to go over these concerns. She will be able to do a full workup for the memory concerns. I'm not sure if he is going to wait until her next appointment in June or wait a few weeks to see if any change and then make an appointment.

## 2022-07-07 ENCOUNTER — Other Ambulatory Visit: Payer: Self-pay

## 2022-07-07 DIAGNOSIS — I10 Essential (primary) hypertension: Secondary | ICD-10-CM

## 2022-07-07 DIAGNOSIS — M25511 Pain in right shoulder: Secondary | ICD-10-CM | POA: Diagnosis not present

## 2022-07-07 DIAGNOSIS — M25611 Stiffness of right shoulder, not elsewhere classified: Secondary | ICD-10-CM | POA: Diagnosis not present

## 2022-07-07 MED ORDER — POTASSIUM CHLORIDE CRYS ER 20 MEQ PO TBCR: EXTENDED_RELEASE_TABLET | ORAL | 3 refills | Status: DC

## 2022-07-16 DIAGNOSIS — M25611 Stiffness of right shoulder, not elsewhere classified: Secondary | ICD-10-CM | POA: Diagnosis not present

## 2022-07-16 DIAGNOSIS — M25511 Pain in right shoulder: Secondary | ICD-10-CM | POA: Diagnosis not present

## 2022-07-18 DIAGNOSIS — M25611 Stiffness of right shoulder, not elsewhere classified: Secondary | ICD-10-CM | POA: Diagnosis not present

## 2022-07-18 DIAGNOSIS — M25511 Pain in right shoulder: Secondary | ICD-10-CM | POA: Diagnosis not present

## 2022-07-22 ENCOUNTER — Other Ambulatory Visit: Payer: Self-pay

## 2022-07-22 DIAGNOSIS — I10 Essential (primary) hypertension: Secondary | ICD-10-CM

## 2022-07-22 MED ORDER — BUMETANIDE 2 MG PO TABS: ORAL_TABLET | ORAL | 3 refills | Status: DC

## 2022-07-23 DIAGNOSIS — M25611 Stiffness of right shoulder, not elsewhere classified: Secondary | ICD-10-CM | POA: Diagnosis not present

## 2022-07-23 DIAGNOSIS — M25511 Pain in right shoulder: Secondary | ICD-10-CM | POA: Diagnosis not present

## 2022-07-25 DIAGNOSIS — M25611 Stiffness of right shoulder, not elsewhere classified: Secondary | ICD-10-CM | POA: Diagnosis not present

## 2022-07-25 DIAGNOSIS — M25511 Pain in right shoulder: Secondary | ICD-10-CM | POA: Diagnosis not present

## 2022-07-28 DIAGNOSIS — I129 Hypertensive chronic kidney disease with stage 1 through stage 4 chronic kidney disease, or unspecified chronic kidney disease: Secondary | ICD-10-CM | POA: Diagnosis not present

## 2022-07-28 DIAGNOSIS — R799 Abnormal finding of blood chemistry, unspecified: Secondary | ICD-10-CM | POA: Diagnosis not present

## 2022-07-28 DIAGNOSIS — N181 Chronic kidney disease, stage 1: Secondary | ICD-10-CM | POA: Diagnosis not present

## 2022-07-28 DIAGNOSIS — D631 Anemia in chronic kidney disease: Secondary | ICD-10-CM | POA: Diagnosis not present

## 2022-07-28 DIAGNOSIS — N1831 Chronic kidney disease, stage 3a: Secondary | ICD-10-CM | POA: Diagnosis not present

## 2022-07-28 DIAGNOSIS — N2581 Secondary hyperparathyroidism of renal origin: Secondary | ICD-10-CM | POA: Diagnosis not present

## 2022-07-28 DIAGNOSIS — R809 Proteinuria, unspecified: Secondary | ICD-10-CM | POA: Diagnosis not present

## 2022-07-29 DIAGNOSIS — M25511 Pain in right shoulder: Secondary | ICD-10-CM | POA: Diagnosis not present

## 2022-07-29 DIAGNOSIS — M25611 Stiffness of right shoulder, not elsewhere classified: Secondary | ICD-10-CM | POA: Diagnosis not present

## 2022-08-01 ENCOUNTER — Other Ambulatory Visit: Payer: Self-pay | Admitting: Adult Health

## 2022-08-01 DIAGNOSIS — M8580 Other specified disorders of bone density and structure, unspecified site: Secondary | ICD-10-CM

## 2022-08-06 DIAGNOSIS — M25511 Pain in right shoulder: Secondary | ICD-10-CM | POA: Diagnosis not present

## 2022-08-06 DIAGNOSIS — M25611 Stiffness of right shoulder, not elsewhere classified: Secondary | ICD-10-CM | POA: Diagnosis not present

## 2022-08-08 DIAGNOSIS — M25611 Stiffness of right shoulder, not elsewhere classified: Secondary | ICD-10-CM | POA: Diagnosis not present

## 2022-08-08 DIAGNOSIS — M25511 Pain in right shoulder: Secondary | ICD-10-CM | POA: Diagnosis not present

## 2022-08-14 DIAGNOSIS — Z9889 Other specified postprocedural states: Secondary | ICD-10-CM | POA: Diagnosis not present

## 2022-09-08 ENCOUNTER — Encounter: Payer: Self-pay | Admitting: Internal Medicine

## 2022-09-23 DIAGNOSIS — Z9889 Other specified postprocedural states: Secondary | ICD-10-CM | POA: Diagnosis not present

## 2022-10-15 ENCOUNTER — Other Ambulatory Visit: Payer: Self-pay | Admitting: Nurse Practitioner

## 2022-10-15 DIAGNOSIS — E790 Hyperuricemia without signs of inflammatory arthritis and tophaceous disease: Secondary | ICD-10-CM

## 2022-10-15 DIAGNOSIS — K219 Gastro-esophageal reflux disease without esophagitis: Secondary | ICD-10-CM

## 2022-10-23 ENCOUNTER — Encounter: Payer: Self-pay | Admitting: Physician Assistant

## 2022-11-18 DIAGNOSIS — L02212 Cutaneous abscess of back [any part, except buttock]: Secondary | ICD-10-CM | POA: Diagnosis not present

## 2022-11-22 ENCOUNTER — Other Ambulatory Visit: Payer: Self-pay | Admitting: Nurse Practitioner

## 2022-11-22 DIAGNOSIS — E782 Mixed hyperlipidemia: Secondary | ICD-10-CM

## 2022-12-04 ENCOUNTER — Other Ambulatory Visit: Payer: Self-pay | Admitting: Nurse Practitioner

## 2022-12-04 DIAGNOSIS — F419 Anxiety disorder, unspecified: Secondary | ICD-10-CM

## 2022-12-15 ENCOUNTER — Ambulatory Visit (INDEPENDENT_AMBULATORY_CARE_PROVIDER_SITE_OTHER): Payer: Medicare Other | Admitting: Nurse Practitioner

## 2022-12-15 ENCOUNTER — Encounter: Payer: Self-pay | Admitting: Nurse Practitioner

## 2022-12-15 VITALS — BP 104/60 | HR 52 | Temp 97.7°F | Ht 66.75 in | Wt 155.8 lb

## 2022-12-15 DIAGNOSIS — Z1329 Encounter for screening for other suspected endocrine disorder: Secondary | ICD-10-CM

## 2022-12-15 DIAGNOSIS — H9193 Unspecified hearing loss, bilateral: Secondary | ICD-10-CM

## 2022-12-15 DIAGNOSIS — I10 Essential (primary) hypertension: Secondary | ICD-10-CM

## 2022-12-15 DIAGNOSIS — Z1389 Encounter for screening for other disorder: Secondary | ICD-10-CM

## 2022-12-15 DIAGNOSIS — R413 Other amnesia: Secondary | ICD-10-CM

## 2022-12-15 DIAGNOSIS — E790 Hyperuricemia without signs of inflammatory arthritis and tophaceous disease: Secondary | ICD-10-CM

## 2022-12-15 DIAGNOSIS — K219 Gastro-esophageal reflux disease without esophagitis: Secondary | ICD-10-CM

## 2022-12-15 DIAGNOSIS — E559 Vitamin D deficiency, unspecified: Secondary | ICD-10-CM | POA: Diagnosis not present

## 2022-12-15 DIAGNOSIS — Z0000 Encounter for general adult medical examination without abnormal findings: Secondary | ICD-10-CM | POA: Diagnosis not present

## 2022-12-15 DIAGNOSIS — M8580 Other specified disorders of bone density and structure, unspecified site: Secondary | ICD-10-CM

## 2022-12-15 DIAGNOSIS — E782 Mixed hyperlipidemia: Secondary | ICD-10-CM | POA: Diagnosis not present

## 2022-12-15 DIAGNOSIS — Z6824 Body mass index (BMI) 24.0-24.9, adult: Secondary | ICD-10-CM

## 2022-12-15 DIAGNOSIS — I700 Atherosclerosis of aorta: Secondary | ICD-10-CM | POA: Diagnosis not present

## 2022-12-15 DIAGNOSIS — Z0001 Encounter for general adult medical examination with abnormal findings: Secondary | ICD-10-CM

## 2022-12-15 DIAGNOSIS — R7309 Other abnormal glucose: Secondary | ICD-10-CM | POA: Diagnosis not present

## 2022-12-15 DIAGNOSIS — Z136 Encounter for screening for cardiovascular disorders: Secondary | ICD-10-CM | POA: Diagnosis not present

## 2022-12-15 DIAGNOSIS — K582 Mixed irritable bowel syndrome: Secondary | ICD-10-CM

## 2022-12-15 DIAGNOSIS — F419 Anxiety disorder, unspecified: Secondary | ICD-10-CM

## 2022-12-15 DIAGNOSIS — Z79899 Other long term (current) drug therapy: Secondary | ICD-10-CM

## 2022-12-15 DIAGNOSIS — N1831 Chronic kidney disease, stage 3a: Secondary | ICD-10-CM

## 2022-12-15 DIAGNOSIS — E669 Obesity, unspecified: Secondary | ICD-10-CM

## 2022-12-15 NOTE — Progress Notes (Signed)
CPE  Assessment:    Encounter for Annual Physical Exam with abnormal findings Due annually  Health Maintenance reviewed Healthy lifestyle reviewed and goals set  Essential hypertension Monitor blood pressure at home; call if consistently over 130/80 Continue DASH diet.   Reminder to go to the ER if any CP, SOB, nausea, dizziness, severe HA, changes vision/speech, left arm numbness and tingling and jaw pain. -     CBC with Differential/Platelet -     COMPLETE METABOLIC PANEL WITH GFR -     TSH -      UA/Microalbumin -      EKG  Hyperlipidemia, mixed Continue medications: rosuvastatin 20mg   Discussed dietary and exercise modifications Low fat diet -     Lipid panel -     TSH  Abnormal glucose Discussed dietary and exercise modifications -     Hemoglobin A1c  Vitamin D deficiency Continue supplementation to maintain goal of 70-100 -     Vitamin D  Gastroesophageal reflux disease, unspecified whether esophagitis present Well managed on current medications Discussed diet, avoiding triggers and other lifestyle changes       -      Magnesium   BMI 24  Discussed dietary and exercise modifications  Anxiety  Declined daily agent, has low dose buspar to try, re-discussed risks, mechanism, benefit; she will hold and consider -  Stress management techniques discussed, increase water, good sleep hygiene discussed, increase exercise, and increase veggies.  Follow up 2-3 month, call the office if any new AE's from medications and we will switch them   Hyperuricemia Continue allopurinol 300mg  daily No recent flares Discussed dietary modifications - Uric acid   Osteopenia - Dexa scheduled for 12/2022, continue Vit D and Ca, weight bearing exercises  CKD 3a (HCC) Increase fluids, avoid NSAIDS, monitor sugars, will monitor Trend up in microalbuminuria; will try adding losartan low dose, 25 mg tabs, start with 1/2 tab daily then increase to whole in 1 week if tolerating Will  check renal US Follow up in 6 weeks for recheck  nephrology referral if unexplained trending down  - CMP/GFR - UA/microalbumin   Hearing loss Encouraged to have repeat hearing test and hearing aids if necessary Believe hearing may be cause of why she thinks she can't remember certain things- she doesn't hear what people say  Memory change MMSE 29/30 Given memory coping strategies Continue word search, crossword puzzles, suduko for brain exercise  IBS- constipation and diarrhea Follow with GI Contiue metamucil, try "Align" 2-3 months  FODMAP reviewed; try tracking and elimination  if any worsening symptoms, blood in stool, AB pain, etc call office  Medication management -     COMPLETE METABOLIC PANEL WITH GFR -     CBC with Differential/Platelet -     Magnesium -     Lipid panel -     TSH -     Hemoglobin A1c -     VITAMIN D 25 Hydroxy (Vit-D Deficiency, Fractures) -     EKG 12-Lead -     [place], RETROPERITNL ABD,  LTD -     Urinalysis, Routine w reflex microscopic -     Microalbumin / creatinine urine ratio  Screening for thyroid disorder -     TSH  Screening for hematuria or proteinuria -     Urinalysis, Routine w reflex microscopic -     Microalbumin / creatinine urine ratio  Screening for ischemic heart disease -     EKG 12-Lead  Screening for  AAA (abdominal aortic aneurysm) -     [place], RETROPERITNL ABD,  LTD     Over 40 minutes of face to face interview, exam, counseling, chart review and critical decision making was performed Future Appointments  Date Time Provider Department Center  01/13/2023  8:30 AM Doree Albee, PA-C [facility]  01/13/2023  4:00 PM [facility] DX DEXA 1 [facility]  03/24/2023 11:00 AM Raynelle Dick, NP [facility] None  12/15/2023  3:00 PM Raynelle Dick, NP [facility] None      Subjective:  ALEXCIA SCHOOLS is a 67 y.o. female who presents for CPE. She has History of colon polyps; Irritable bowel syndrome; GERD  (gastroesophageal reflux disease); Hyperlipidemia, mixed; Essential hypertension; Vitamin D deficiency; Peripheral neuropathy; Hyperuricemia; Abnormal glucose; FHx: heart disease; Anxiety; CKD (chronic kidney disease) stage 3, GFR 30-59 ml/min (HCC); Osteopenia; and Obesity (BMI 30.0-34.9) on their problem list.  She is single, 1 grown son, 4 grandchildren.  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  Has DEXA end of JULY scheduled  She is having difficulty with her hearing.  She was tested several years ago but did not need hearing aids at that time.   She has GERD controlled by lifestyle/ famotidine 40 mg daily.  Has IBS-D for many years, takes metamucil, unsure of triggers despite tracking attempts. She stays bloated a lot- she has appointment with GI 01/13/23. Mild constipation not relieved with metamucil so stopped. Miralax made her have diarrhea.   She has anxiety, off of lexapro as made diarrhea worse, currently on buspar 5 mg TID  and is helping anxiety.  She has R rotator cuff tear, Dr. Duwayne Heck at [facility] did surgical repair 04/2022- she completed PT and is feeling a lot better.    BMI is Body mass index is 24.58 kg/m., she has been working on diet and exercise. Limits red meat. She eats fresh fruits and vegetables. She does a little bit of walking.  She goes to the gym occasionally to do treadmill.  Wt Readings from Last 3 Encounters:  12/15/22 155 lb 12.8 oz (70.7 kg)  06/20/22 159 lb 9.6 oz (72.4 kg)  02/19/22 152 lb (68.9 kg)   She has had elevated blood pressure for  years. She is on losartan Her blood pressure has been controlled at home, today their BP is BP: 104/60  BP Readings from Last 3 Encounters:  12/15/22 104/60  06/20/22 132/78  02/19/22 114/70  She does not workout. She denies chest pain, shortness of breath, dizziness.   She reports dependent edema of bil ankles, without fatigue, PND; resolves by AM. She takes bumex once a day with potassium.  Compression hose during the winter but admits not during the summer, swelling is worse then. She reports following low sodium diet, pushing fluid intake.   She is on cholesterol medication  (rosuvastaitn 20 mg daily) and denies myalgias. Her cholesterol is not at goal. The cholesterol last visit was:   Lab Results  Component Value Date   CHOL 153 06/20/2022   HDL 64 06/20/2022   LDLCALC 65 06/20/2022   TRIG 162 (H) 06/20/2022   CHOLHDL 2.4 06/20/2022    She has not been working on diet and exercise for abnormal glucose/hx of prediabetes, and denies polydipsia, polyuria, visual disturbances, vomiting and weight loss. Last A1C in the office was:  Lab Results  Component Value Date   HGBA1C 5.2 12/11/2021   She denies NSAID, gets 64+ fluid ounces of water daily,  has reduced bumex from BID to once daily. She has been evaluated by [facility] and has a follow up 01/2023. Last GFR: Lab Results  Component Value Date   EGFR 57 (L) 06/20/2022   EGFR 56 (L) 01/23/2022   EGFR 56 (L) 12/11/2021   Lab Results  Component Value Date   MICRALBCREAT 109 (H) 01/23/2022   Patient is on Vitamin D supplement.   Lab Results  Component Value Date   VD25OH 95 12/11/2021     Patient is on allopurinol for hyperuricemia and does not report a recent flare.  Lab Results  Component Value Date   LABURIC 3.2 12/11/2021      12/15/2022    3:16 PM 06/07/2020    2:46 PM  MMSE - Mini Mental State Exam  Orientation to time 5 5  Orientation to Place 5 5  Registration 3 3  Attention/ Calculation 5 5  Recall 2 3  Language- name 2 objects 2 2  Language- repeat 1 1  Language- follow 3 step command 3 3  Language- read & follow direction 1 1  Write a sentence 1 1  Copy design 1 1  Total score 29 30      Medication Review: Current Outpatient Medications on File Prior to Visit  Medication Sig Dispense Refill   allopurinol (ZYLOPRIM) 300 MG tablet TAKE 1 TABLET BY MOUTH DAILY FOR GOUT PREVENTION 100  tablet 2   Ascorbic Acid (VITAMIN C WITH ROSE HIPS) 1000 MG tablet Take 1,000 mg by mouth daily.     aspirin 81 MG tablet Take 81 mg by mouth every other day.      bumetanide (BUMEX) 2 MG tablet TAKE 1 TABLET BY MOUTH  TWICE DAILY FOR BLOOD  PRESSURE AND FLUID 180 tablet 3   busPIRone (BUSPAR) 5 MG tablet TAKE 1 TABLET BY MOUTH UP TO 3  TIMES DAILY AS NEEDED FOR  ANXIETY 240 tablet 1   Cholecalciferol (VITAMIN D3) 125 MCG (5000 UT) CAPS Take by mouth daily.     Cyanocobalamin (B-12 PO) Take by mouth daily.     famotidine (PEPCID) 40 MG tablet TAKE 1 TABLET BY MOUTH AT  BEDTIME TO PREVENT INDIGESTION  AND HEARTBURN 100 tablet 2   losartan (COZAAR) 25 MG tablet TAKE ONE-HALF TABLET BY MOUTH  DAILY FOR KIDNEYS/PROTEIN,  INCREASE TO TAKING 1 TABLET IN 1 WEEK IF BLOOD PRESSURE TOLERATES (Patient taking differently: TAKE ONE-HALF TABLET BY MOUTH  DAILY FOR KIDNEYS/PROTEIN,  INCREASE TO TAKING 1 TABLET IN 1 WEEK IF BLOOD PRESSURE TOLERATES) 90 tablet 3   Magnesium 250 MG TABS Take 2 tablets by mouth 2 (two) times daily.     Multiple Vitamin (MULTIVITAMIN) tablet Take 1 tablet by mouth daily.     OVER THE COUNTER MEDICATION Takes OTC Allegra for allergies.     potassium chloride SA (KLOR-CON M) 20 MEQ tablet TAKE 1 TABLET BY MOUTH  TWICE DAILY FOR POTASSIUM 180 tablet 3   rosuvastatin (CRESTOR) 20 MG tablet Take   1 tablet Daily for Cholesterol                                                          /  TAKE                                          BY                                           MOUTH 90 tablet 3   Zinc 50 MG CAPS Take by mouth daily.     fluticasone (FLONASE) 50 MCG/ACT nasal spray Place 1 spray into both nostrils daily.     No current facility-administered medications on file prior to visit.    Allergies  Allergen Reactions   Alprazolam Other (See Comments)     Unknown reaction per pt   Lexapro [Escitalopram]     Diarrhea/nausea     Prednisone     Only high doses    Current Problems (verified) Patient Active Problem List   Diagnosis Date Noted   Obesity (BMI 30.0-34.9) 12/11/2021   CKD (chronic kidney disease) stage 3, GFR 30-59 ml/min (HCC) 11/29/2020   Osteopenia 11/29/2020   Anxiety 08/23/2019   FHx: heart disease 04/21/2018   Abnormal glucose 02/05/2015   Hyperuricemia 08/13/2014   GERD (gastroesophageal reflux disease)    Hyperlipidemia, mixed    Essential hypertension    Vitamin D deficiency    Peripheral neuropathy    History of colon polyps 10/18/2007   Irritable bowel syndrome 10/18/2007    Screening Tests Immunization History  Administered Date(s) Administered   Influenza Inj Mdck Quad With Preservative 04/21/2018, 05/18/2019, 06/07/2020   Influenza, High Dose Seasonal PF 06/05/2021, 05/21/2022   PFIZER(Purple Top)SARS-COV-2 Vaccination 08/30/2019, 09/19/2019, 10/12/2019   PPD Test 01/25/2014, 02/05/2015, 02/27/2016, 04/21/2018, 05/18/2019   Pneumococcal Conjugate-13 08/13/2020   Pneumococcal Polysaccharide-23 11/23/2008, 12/11/2021   Td 11/29/2020   Tdap 11/29/2009   Unspecified SARS-COV-2 Vaccination 09/19/2019   Zoster Recombinat (Shingrix) 09/22/2020   Health Maintenance  Topic Date Due   Zoster Vaccines- Shingrix (2 of 2) 11/17/2020   COVID-19 Vaccine (5 - 2023-24 season) 02/21/2022   INFLUENZA VACCINE  01/22/2023   MAMMOGRAM  04/22/2023   Medicare Annual Wellness (AWV)  06/21/2023   Colonoscopy  08/31/2024   DTaP/Tdap/Td (3 - Td or Tdap) 11/30/2030   Pneumonia Vaccine 71+ Years old  Completed   DEXA SCAN  Completed   Hepatitis C Screening  Completed   HPV VACCINES  Aged Out      Names of Other Physician/Practitioners you currently use: 1. [facility] here for primary care 2. Eye Exam, [facility], glasses, mild glaucoma, goes annually, last 07/2021 3. Dental Exam 2023, goes q39m  Patient Care Team: Lucky Cowboy, MD as PCP -  General (Internal Medicine)  SURGICAL HISTORY She  has a past surgical history that includes goiter removed fro thyroid; Foot surgery; Colonoscopy; Cholecystectomy (04/14/2012); Open reduction internal fixation (orif) distal radial fracture (Right, 07/04/2015); carpel tunnel surgery (Right); Wisdom tooth extraction; Eye surgery; and Polypectomy. FAMILY HISTORY Her family history includes Arrhythmia in her mother; Breast cancer in her maternal aunt; COPD in her paternal grandfather; Colon cancer (age of onset: 44) in her father; Colon polyps in her father; Hypertension in her mother; Macular degeneration in her brother. SOCIAL HISTORY She  reports that she has never smoked. She has never used smokeless tobacco. She reports that she does not drink  alcohol and does not use drugs.    Review of Systems  Constitutional:  Negative for malaise/fatigue and weight loss.  HENT:  Negative for hearing loss and tinnitus.   Eyes:  Negative for blurred vision and double vision.  Respiratory:  Negative for cough, shortness of breath and wheezing.   Cardiovascular:  Negative for chest pain, palpitations, orthopnea, claudication and leg swelling.  Gastrointestinal:  Positive for constipation (intermittent and associated with bloating). Negative for abdominal pain, blood in stool, diarrhea, heartburn, melena, nausea and vomiting.  Genitourinary: Negative.   Musculoskeletal:  Negative for joint pain and myalgias.  Skin:  Negative for rash.  Neurological:  Negative for dizziness, tingling, sensory change, weakness and headaches.  Endo/Heme/Allergies:  Negative for polydipsia.  Psychiatric/Behavioral:  Negative for depression, substance abuse and suicidal ideas. The patient is nervous/anxious. The patient does not have insomnia.   All other systems reviewed and are negative.    Objective:     Today's Vitals   12/15/22 1452  BP: 104/60  Pulse: (!) 52  Temp: 97.7 F (36.5 C)  SpO2: 98%  Weight: 155 lb  12.8 oz (70.7 kg)  Height: 5' 6.75" (1.695 m)   Body mass index is 24.58 kg/m.  General appearance: alert, no distress, WD/WN, female HEENT: normocephalic, sclerae anicteric, TMs pearly, nares patent, no discharge or erythema, pharynx normal. Hard of hearing Oral cavity: MMM, no lesions Neck: supple, no lymphadenopathy, no thyromegaly, no masses Heart: RRR, normal S1, S2, no murmurs Lungs: CTA bilaterally, no wheezes, rhonchi, or rales Abdomen: +bs, soft, non tender, non distended, no masses, no hepatomegaly, no splenomegaly Musculoskeletal: nontender, no swelling, no obvious deformity.  Extremities: Bil ankles 1+ non pitting edema, no cyanosis, no clubbing Pulses: 2+ symmetric, upper and lower extremities, normal cap refill Neurological: alert, oriented x 3, CN2-12 intact, strength normal upper extremities and lower extremities, sensation normal throughout, DTRs 2+ throughout, no cerebellar signs, gait normal Psychiatric: normal affect, behavior normal, pleasant  GU: defer to GYN Breasts: defer to GYN, getting mammograms  EKG: PAC, Sinus bradycardia, NSCPT AAA: < 3 cm  Ellouise Mcwhirter Hollie Salk, NP 3:02 PM [facility]

## 2022-12-15 NOTE — Patient Instructions (Addendum)
Low-FODMAP Eating Plan  FODMAP stands for fermentable oligosaccharides, disaccharides, monosaccharides, and polyols. These are sugars that are hard for some people to digest. A low-FODMAP eating plan may help some people who have irritable bowel syndrome (IBS) and certain other bowel (intestinal) diseases to manage their symptoms. This meal plan can be complicated to follow. Work with a diet and nutrition specialist (dietitian) to make a low-FODMAP eating plan that is right for you. A dietitian can help make sure that you get enough nutrition from this diet. What are tips for following this plan? Reading food labels Check labels for hidden FODMAPs such as: High-fructose syrup. Honey. Agave. Natural fruit flavors. Onion or garlic powder. Choose low-FODMAP foods that contain 3-4 grams of fiber per serving. Check food labels for serving sizes. Eat only one serving at a time to make sure FODMAP levels stay low. Shopping Shop with a list of foods that are recommended on this diet and make a meal plan. Meal planning Follow a low-FODMAP eating plan for up to 6 weeks, or as told by your health care provider or dietitian. To follow the eating plan: Eliminate high-FODMAP foods from your diet completely. Choose only low-FODMAP foods to eat. You will do this for 2-6 weeks. Gradually reintroduce high-FODMAP foods into your diet one at a time. Most people should wait a few days before introducing the next new high-FODMAP food into their meal plan. Your dietitian can recommend how quickly you may reintroduce foods. Keep a daily record of what and how much you eat and drink. Make note of any symptoms that you have after eating. Review your daily record with a dietitian regularly to identify which foods you can eat and which foods you should avoid. General tips Drink enough fluid each day to keep your urine pale yellow. Avoid processed foods. These often have added sugar and may be high in FODMAPs. Avoid  most dairy products, whole grains, and sweeteners. Work with a dietitian to make sure you get enough fiber in your diet. Avoid high FODMAP foods at meals to manage symptoms. Recommended foods Fruits Bananas, oranges, tangerines, lemons, limes, blueberries, raspberries, strawberries, grapes, cantaloupe, honeydew melon, kiwi, papaya, passion fruit, and pineapple. Limited amounts of dried cranberries, banana chips, and shredded coconut. Vegetables Eggplant, zucchini, cucumber, peppers, green beans, bean sprouts, lettuce, arugula, kale, Swiss chard, spinach, collard greens, bok choy, summer squash, potato, and tomato. Limited amounts of corn, carrot, and sweet potato. Green parts of scallions. Grains Gluten-free grains, such as rice, oats, buckwheat, quinoa, corn, polenta, and millet. Gluten-free pasta, bread, or cereal. Rice noodles. Corn tortillas. Meats and other proteins Unseasoned beef, pork, poultry, or fish. Eggs. Bacon. Tofu (firm) and tempeh. Limited amounts of nuts and seeds, such as almonds, walnuts, brazil nuts, pecans, peanuts, nut butters, pumpkin seeds, chia seeds, and sunflower seeds. Dairy Lactose-free milk, yogurt, and kefir. Lactose-free cottage cheese and ice cream. Non-dairy milks, such as almond, coconut, hemp, and rice milk. Non-dairy yogurt. Limited amounts of goat cheese, brie, mozzarella, parmesan, swiss, and other hard cheeses. Fats and oils Butter-free spreads. Vegetable oils, such as olive, canola, and sunflower oil. Seasoning and other foods Artificial sweeteners with names that do not end in "ol," such as aspartame, saccharine, and stevia. Maple syrup, white table sugar, raw sugar, brown sugar, and molasses. Mayonnaise, soy sauce, and tamari. Fresh basil, coriander, parsley, rosemary, and thyme. Beverages Water and mineral water. Sugar-sweetened soft drinks. Small amounts of orange juice or cranberry juice. Black and green tea. Most dry wines.   Coffee. The items listed  above may not be a complete list of foods and beverages you can eat. Contact a dietitian for more information. Foods to avoid Fruits Fresh, dried, and juiced forms of apple, pear, watermelon, peach, plum, cherries, apricots, blackberries, boysenberries, figs, nectarines, and mango. Avocado. Vegetables Chicory root, artichoke, asparagus, cabbage, snow peas, Brussels sprouts, broccoli, sugar snap peas, mushrooms, celery, and cauliflower. Onions, garlic, leeks, and the white part of scallions. Grains Wheat, including kamut, durum, and semolina. Barley and bulgur. Couscous. Wheat-based cereals. Wheat noodles, bread, crackers, and pastries. Meats and other proteins Fried or fatty meat. Sausage. Cashews and pistachios. Soybeans, baked beans, black beans, chickpeas, kidney beans, fava beans, navy beans, lentils, black-eyed peas, and split peas. Dairy Milk, yogurt, ice cream, and soft cheese. Cream and sour cream. Milk-based sauces. Custard. Buttermilk. Soy milk. Seasoning and other foods Any sugar-free gum or candy. Foods that contain artificial sweeteners such as sorbitol, mannitol, isomalt, or xylitol. Foods that contain honey, high-fructose corn syrup, or agave. Bouillon, vegetable stock, beef stock, and chicken stock. Garlic and onion powder. Condiments made with onion, such as hummus, chutney, pickles, relish, salad dressing, and salsa. Tomato paste. Beverages Chicory-based drinks. Coffee substitutes. Chamomile tea. Fennel tea. Sweet or fortified wines such as port or sherry. Diet soft drinks made with isomalt, mannitol, maltitol, sorbitol, or xylitol. Apple, pear, and mango juice. Juices with high-fructose corn syrup. The items listed above may not be a complete list of foods and beverages you should avoid. Contact a dietitian for more information. Summary FODMAP stands for fermentable oligosaccharides, disaccharides, monosaccharides, and polyols. These are sugars that are hard for some people to  digest. A low-FODMAP eating plan is a short-term diet that helps to ease symptoms of certain bowel diseases. The eating plan usually lasts up to 6 weeks. After that, high-FODMAP foods are reintroduced gradually and one at a time. This can help you find out which foods may be causing symptoms. A low-FODMAP eating plan can be complicated. It is best to work with a dietitian who has experience with this type of plan. This information is not intended to replace advice given to you by your health care provider. Make sure you discuss any questions you have with your health care provider. Document Revised: 10/27/2019 Document Reviewed: 10/27/2019 Elsevier Patient Education  2024 [employer] Inc. Memory Compensation Strategies  Use "WARM" strategy.  W= write it down  A= associate it  R= repeat it  M= make a mental note  2.   You can keep a Glass blower/designer.  Use a 3-ring notebook with sections for the following: calendar, important names and phone numbers,  medications, doctors' names/phone numbers, lists/reminders, and a section to journal what you did  each day.   3.    Use a calendar to write appointments down.  4.    Write yourself a schedule for the day.  This can be placed on the calendar or in a separate section of the Memory Notebook.  Keeping a  regular schedule can help memory.  5.    Use medication organizer with sections for each day or morning/evening pills.  You may need help loading it  6.    Keep a basket, or pegboard by the door.  Place items that you need to take out with you in the basket or on the pegboard.  You may also want to  include a message board for reminders.  7.    Use sticky notes.  Place sticky notes with  reminders in a place where the task is performed.  For example: " turn off the  stove" placed by the stove, "lock the door" placed on the door at eye level, " take your medications" on  the bathroom mirror or by the place where you normally take your  medications.  8.    Use alarms/timers.  Use while cooking to remind yourself to check on food or as a reminder to take your medicine, or as a  reminder to make a call, or as a reminder to perform another task, etc.

## 2022-12-16 LAB — LIPID PANEL
Cholesterol: 140 mg/dL (ref <200)
HDL: 64 mg/dL (ref >=50)
LDL Cholesterol (Calc): 53 mg/dL (calc)
Non-HDL Cholesterol (Calc): 76 mg/dL (calc) (ref <130)
Total CHOL/HDL Ratio: 2.2 (calc) (ref <5.0)
Triglycerides: 149 mg/dL (ref <150)

## 2022-12-16 LAB — TSH: TSH: 2.01 mIU/L (ref 0.40–4.50)

## 2022-12-16 LAB — COMPLETE METABOLIC PANEL WITH GFR
AG Ratio: 2 (calc) (ref 1.0–2.5)
ALT: 21 U/L (ref 6–29)
AST: 28 U/L (ref 10–35)
Albumin: 4.6 g/dL (ref 3.6–5.1)
Alkaline phosphatase (APISO): 73 U/L (ref 37–153)
BUN/Creatinine Ratio: 28 (calc) — ABNORMAL HIGH (ref 6–22)
BUN: 31 mg/dL — ABNORMAL HIGH (ref 7–25)
CO2: 29 mmol/L (ref 20–32)
Calcium: 10.1 mg/dL (ref 8.6–10.4)
Chloride: 102 mmol/L (ref 98–110)
Creat: 1.11 mg/dL — ABNORMAL HIGH (ref 0.50–1.05)
Globulin: 2.3 g/dL (calc) (ref 1.9–3.7)
Glucose, Bld: 77 mg/dL (ref 65–99)
Potassium: 4.5 mmol/L (ref 3.5–5.3)
Sodium: 141 mmol/L (ref 135–146)
Total Bilirubin: 0.7 mg/dL (ref 0.2–1.2)
Total Protein: 6.9 g/dL (ref 6.1–8.1)
eGFR: 54 mL/min/{1.73_m2} — ABNORMAL LOW (ref >=60)

## 2022-12-16 LAB — MICROALBUMIN / CREATININE URINE RATIO
Creatinine, Urine: 35 mg/dL (ref 20–275)
Microalb Creat Ratio: 34 mg/g creat — ABNORMAL HIGH (ref <30)
Microalb, Ur: 1.2 mg/dL

## 2022-12-16 LAB — CBC WITH DIFFERENTIAL/PLATELET
Absolute Monocytes: 729 cells/uL (ref 200–950)
Basophils Absolute: 99 cells/uL (ref 0–200)
Basophils Relative: 1.1 %
Eosinophils Absolute: 324 cells/uL (ref 15–500)
Eosinophils Relative: 3.6 %
HCT: 39.9 % (ref 35.0–45.0)
Hemoglobin: 13.7 g/dL (ref 11.7–15.5)
Lymphs Abs: 3240 cells/uL (ref 850–3900)
MCH: 32.8 pg (ref 27.0–33.0)
MCHC: 34.3 g/dL (ref 32.0–36.0)
MCV: 95.5 fL (ref 80.0–100.0)
MPV: 13.9 fL — ABNORMAL HIGH (ref 7.5–12.5)
Monocytes Relative: 8.1 %
Neutro Abs: 4608 cells/uL (ref 1500–7800)
Neutrophils Relative %: 51.2 %
Platelets: 288 10*3/uL (ref 140–400)
RBC: 4.18 10*6/uL (ref 3.80–5.10)
RDW: 12.4 % (ref 11.0–15.0)
Total Lymphocyte: 36 %
WBC: 9 10*3/uL (ref 3.8–10.8)

## 2022-12-16 LAB — URINALYSIS, ROUTINE W REFLEX MICROSCOPIC
Bacteria, UA: NONE SEEN /HPF
Bilirubin Urine: NEGATIVE
Glucose, UA: NEGATIVE
Hgb urine dipstick: NEGATIVE
Hyaline Cast: NONE SEEN /LPF
Ketones, ur: NEGATIVE
Nitrite: NEGATIVE
Protein, ur: NEGATIVE
RBC / HPF: NONE SEEN /HPF (ref 0–2)
Specific Gravity, Urine: 1.008 (ref 1.001–1.035)
Squamous Epithelial / HPF: NONE SEEN /HPF (ref <=5)
pH: 7 (ref 5.0–8.0)

## 2022-12-16 LAB — MAGNESIUM: Magnesium: 2.1 mg/dL (ref 1.5–2.5)

## 2022-12-16 LAB — HEMOGLOBIN A1C
Hgb A1c MFr Bld: 5.4 % of total Hgb (ref <5.7)
Mean Plasma Glucose: 108 mg/dL
eAG (mmol/L): 6 mmol/L

## 2022-12-16 LAB — VITAMIN D 25 HYDROXY (VIT D DEFICIENCY, FRACTURES): Vit D, 25-Hydroxy: 87 ng/mL (ref 30–100)

## 2022-12-16 LAB — MICROSCOPIC MESSAGE

## 2022-12-19 ENCOUNTER — Encounter: Payer: Self-pay | Admitting: Nurse Practitioner

## 2023-01-12 ENCOUNTER — Other Ambulatory Visit: Payer: Self-pay | Admitting: Nurse Practitioner

## 2023-01-12 DIAGNOSIS — N1831 Chronic kidney disease, stage 3a: Secondary | ICD-10-CM

## 2023-01-12 NOTE — Progress Notes (Unsigned)
01/13/2023 Katie Cox [id number] 09/11/1955  Referring provider: Lucky Cowboy, MD Primary GI doctor: Dr. 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, will try miralax/fiber combination first, samples on linzess if not helping Will check Sed rate/CRP to rule out inflammation Check for celiac Some fullness on exam, will get AB [place] to evaluate bloating further Can do trial of IBGARD daily FODMAP,  and lifestyle changes discussed Consider SIBO testing or xifaxin trial pending results Follow up 2 months or sooner if symptoms get worse or any new symptoms  Gastroesophageal reflux disease without esophagitis Lifestyle changes discussed, avoid NSAIDS, ETOH Continue pepcid as needed, well controlled at this time  History of adenomatous polyp of colon Recall 2026  Diverticulosis Will call if any symptoms. Add on fiber supplement, avoid NSAIDS, information given   Patient Care Team: Lucky Cowboy, MD as PCP - General (Internal Medicine)  HISTORY OF PRESENT ILLNESS: 66 y.o. female with a past medical history of hypertension, hyperlipidemia, anxiety, CKD stage III, peripheral neuropathy, IBS, personal history of colon polyps and GERD and others listed below presents for evaluation of abdominal pain/bloating.  09/01/2019 colonoscopy Dr. Rhea Belton for personal history of TA polyps, good bowel prep post polypectomy scar seen at tattoo site hepatic flexure, 2 polyps 2 to 5 mm transverse colon, diverticulosis, small internal hemorrhoids.  TA polyps recall 5 years 12/15/2022 office visit with primary care for physical showed no anemia, kidney function stable with GFR 54, normal liver function, normal thyroid  She states this has been going on for a year or longer but has been getting worse with AB pain and constipation.  She has BM every other day or once a day, she has always had issues with constipation.  She feels bloated constantly, lower AB,  can be worse with food, especially broccoli/cauliflower. She states she was vacation last week and ate sausage and bacon with diarrhea afterwards. She has has cholecystectomy. She is on pepcid at night, rarely has GERD.  Denies fever, chills.  Denies weight loss.  She tried miralax for a few days, helped but read should not be on it so stopped it. Metamucil she could not tell a difference.   She denies blood thinner use.  She denies NSAID use.  She denies ETOH use.   She denies tobacco use.  She denies drug use.    She  reports that she has never smoked. She has never used smokeless tobacco. She reports that she does not drink alcohol and does not use drugs.  RELEVANT LABS AND IMAGING: CBC    Component Value Date/Time   WBC 9.0 12/15/2022 1544   RBC 4.18 12/15/2022 1544   HGB 13.7 12/15/2022 1544   HGB 12.3 07/21/2008 1509   HGB 13.1 10/10/2005 1541   HCT 39.9 12/15/2022 1544   HCT 35.0 07/21/2008 1509   HCT 36.9 10/10/2005 1541   PLT 288 12/15/2022 1544   PLT 391 07/21/2008 1509   PLT 410 (H) 10/10/2005 1541   MCV 95.5 12/15/2022 1544   MCV 90 07/21/2008 1509   MCV 92.7 10/10/2005 1541   MCH 32.8 12/15/2022 1544   MCHC 34.3 12/15/2022 1544   RDW 12.4 12/15/2022 1544   RDW 10.9 07/21/2008 1509   RDW 11.9 10/10/2005 1541   LYMPHSABS 3,240 12/15/2022 1544   LYMPHSABS 5.0 (H) 07/21/2008 1509   LYMPHSABS 5.4 (H) 10/10/2005 1541   MONOABS 570 01/05/2017 1224   MONOABS 0.9 10/10/2005 1541  EOSABS 324 12/15/2022 1544   EOSABS 0.3 07/21/2008 1509   BASOSABS 99 12/15/2022 1544   BASOSABS 0.1 07/21/2008 1509   BASOSABS 0.1 10/10/2005 1541   Recent Labs    06/20/22 0000 12/15/22 1544  HGB 13.9 13.7    CMP     Component Value Date/Time   NA 141 12/15/2022 1544   K 4.5 12/15/2022 1544   CL 102 12/15/2022 1544   CO2 29 12/15/2022 1544   GLUCOSE 77 12/15/2022 1544   BUN 31 (H) 12/15/2022 1544   CREATININE 1.11 (H) 12/15/2022 1544   CALCIUM 10.1 12/15/2022 1544    PROT 6.9 12/15/2022 1544   ALBUMIN 4.4 01/05/2017 1224   AST 28 12/15/2022 1544   ALT 21 12/15/2022 1544   ALKPHOS 71 01/05/2017 1224   BILITOT 0.7 12/15/2022 1544   GFRNONAA 60 (L) 07/04/2021 1348   GFRNONAA 56 (L) 11/29/2020 0000   GFRAA 65 11/29/2020 0000      Latest Ref Rng & Units 12/15/2022    3:44 PM 06/20/2022   12:00 AM 12/11/2021    4:27 PM  Hepatic Function  Total Protein 6.1 - 8.1 g/dL 6.9  7.4  7.2   AST 10 - 35 U/L 28  30  30    ALT 6 - 29 U/L 21  24  25    Total Bilirubin 0.2 - 1.2 mg/dL 0.7  0.9  1.0       Current Medications:    Current Outpatient Medications (Cardiovascular):    bumetanide (BUMEX) 2 MG tablet, Take 2 mg by mouth daily.   losartan (COZAAR) 25 MG tablet, TAKE 1 TABLET BY MOUTH  DAILY FOR KIDNEYS/PROTEIN.   rosuvastatin (CRESTOR) 20 MG tablet, Take   1 tablet Daily for Cholesterol                                                          /                                                    TAKE                                          BY                                           MOUTH  Current Outpatient Medications (Respiratory):    fluticasone (FLONASE) 50 MCG/ACT nasal spray, Place 1 spray into both nostrils daily. (Patient taking differently: Place 1 spray into both nostrils daily as needed.)  Current Outpatient Medications (Analgesics):    allopurinol (ZYLOPRIM) 300 MG tablet, TAKE 1 TABLET BY MOUTH DAILY FOR GOUT PREVENTION   aspirin 81 MG tablet, Take 81 mg by mouth every other day.   Current Outpatient Medications (Hematological):    Cyanocobalamin (B-12 PO), Take by mouth daily.  Current Outpatient Medications (Other):    Ascorbic Acid (VITAMIN C WITH ROSE HIPS) 1000 MG tablet, Take 1,000  mg by mouth daily.   busPIRone (BUSPAR) 5 MG tablet, TAKE 1 TABLET BY MOUTH UP TO 3  TIMES DAILY AS NEEDED FOR  ANXIETY   Cholecalciferol (VITAMIN D3) 125 MCG (5000 UT) CAPS, Take by mouth daily.   famotidine (PEPCID) 40 MG tablet, TAKE 1 TABLET BY  MOUTH AT  BEDTIME TO PREVENT INDIGESTION  AND HEARTBURN   magnesium gluconate (MAGONATE) 500 MG tablet, Take 500 mg by mouth 2 (two) times daily.   Multiple Vitamin (MULTIVITAMIN) tablet, Take 1 tablet by mouth daily.   OVER THE COUNTER MEDICATION, Takes OTC Allegra for allergies.   potassium chloride SA (KLOR-CON M) 20 MEQ tablet, TAKE 1 TABLET BY MOUTH  TWICE DAILY FOR POTASSIUM   Zinc 50 MG CAPS, Take by mouth daily.  Medical History:  Past Medical History:  Diagnosis Date   Abnormal glucose 02/05/2015   Allergy    seasonal   Contact lens/glasses fitting    wears contacts or glasses   Diverticulosis    GERD (gastroesophageal reflux disease)    Hyperlipidemia    Hypertension    Hyperuricemia 08/13/2014   Irritable bowel syndrome 10/18/2007   Irritable bowel syndrome (IBS)    Medication management 12/26/2013   Peripheral neuropathy    in feet   POLYP, COLON 10/18/2007   PONV (postoperative nausea and vomiting)    nausea, slow to wake up at times   Vitamin D deficiency    Allergies:  Allergies  Allergen Reactions   Alprazolam Other (See Comments)     Unknown reaction per pt   Lexapro [Escitalopram]     Diarrhea/nausea    Prednisone     Only high doses     Surgical History:  She  has a past surgical history that includes goiter removed fro thyroid; Foot surgery; Colonoscopy; Cholecystectomy (04/14/2012); Open reduction internal fixation (orif) distal radial fracture (Right, 07/04/2015); carpel tunnel surgery (Right); Wisdom tooth extraction; Eye surgery; and Polypectomy. Family History:  Her family history includes Arrhythmia in her mother; Breast cancer in her maternal aunt; COPD in her paternal grandfather; Colon cancer (age of onset: 54) in her father; Colon polyps in her father; Hypertension in her mother; Macular degeneration in her brother.  REVIEW OF SYSTEMS  :  Review of Systems  Constitutional: Negative.  Negative for chills, fever and weight loss.  HENT:  Negative.    Eyes: Negative.   Respiratory: Negative.  Negative for shortness of breath.   Cardiovascular: Negative.  Negative for chest pain.  Gastrointestinal:  Positive for constipation, diarrhea and heartburn (controlled). Negative for abdominal pain (bloating), blood in stool, melena, nausea and vomiting.  Genitourinary: Negative.  Negative for dysuria and frequency.  Musculoskeletal: Negative.   Skin: Negative.   Neurological: Negative.   Endo/Heme/Allergies: Negative.   Psychiatric/Behavioral: Negative.       PHYSICAL EXAM: BP 110/60   Pulse (!) 53   Ht 5\' 8"  (1.727 m)   Wt 157 lb (71.2 kg)   BMI 23.87 kg/m  General Appearance: Well nourished, in no apparent distress. Head:   Normocephalic and atraumatic. Eyes:  sclerae anicteric,conjunctive pink  Respiratory: Respiratory effort normal, BS equal bilaterally without rales, rhonchi, wheezing. Cardio: RRR with no MRGs. Peripheral pulses intact.  Abdomen: Soft,  Non-distended ,active bowel sounds. mild tenderness in the lower abdomen, some lower AB fullness with palpation, Without guarding and Without rebound. No masses. Rectal: Not evaluated Musculoskeletal: Full ROM, Normal gait. Without edema. Skin:  Dry and intact without significant lesions or rashes Neuro: Alert  and  oriented x4;  No focal deficits. Psych:  Cooperative. Normal mood and affect.    Doree Albee, PA-C 8:55 AM

## 2023-01-13 ENCOUNTER — Encounter: Payer: Self-pay | Admitting: Physician Assistant

## 2023-01-13 ENCOUNTER — Other Ambulatory Visit: Payer: Medicare Other

## 2023-01-13 ENCOUNTER — Other Ambulatory Visit (INDEPENDENT_AMBULATORY_CARE_PROVIDER_SITE_OTHER): Payer: Medicare Other

## 2023-01-13 ENCOUNTER — Ambulatory Visit: Payer: Medicare Other | Admitting: Physician Assistant

## 2023-01-13 VITALS — BP 110/60 | HR 53 | Ht 68.0 in | Wt 157.0 lb

## 2023-01-13 DIAGNOSIS — K5730 Diverticulosis of large intestine without perforation or abscess without bleeding: Secondary | ICD-10-CM

## 2023-01-13 DIAGNOSIS — K5904 Chronic idiopathic constipation: Secondary | ICD-10-CM | POA: Diagnosis not present

## 2023-01-13 DIAGNOSIS — K219 Gastro-esophageal reflux disease without esophagitis: Secondary | ICD-10-CM | POA: Diagnosis not present

## 2023-01-13 DIAGNOSIS — R140 Abdominal distension (gaseous): Secondary | ICD-10-CM | POA: Diagnosis not present

## 2023-01-13 DIAGNOSIS — Z86010 Personal history of colonic polyps: Secondary | ICD-10-CM

## 2023-01-13 LAB — HIGH SENSITIVITY CRP: CRP, High Sensitivity: 1.14 mg/L (ref 0.000–5.000)

## 2023-01-13 LAB — SEDIMENTATION RATE: Sed Rate: 20 mm/hr (ref 0–30)

## 2023-01-13 NOTE — Progress Notes (Signed)
Addendum: Reviewed and agree with assessment and management plan. Pyrtle, Jay M, MD  

## 2023-01-13 NOTE — Patient Instructions (Addendum)
We have scheduled you for a follow up with Quentin Mulling on 03/27/2023 at 3:20pm.  You have been scheduled for an abdominal ultrasound at [facility] (1st floor of hospital) on 01/19/2023 at 10:30 am. Please arrive 30 minutes prior to your appointment for registration. Make certain not to have anything to eat or drink after midnight the night before the procedure. Should you need to reschedule your appointment, please contact radiology at [phone]. This test typically takes about 30 minutes to perform.   Your provider has requested that you go to the basement level for lab work before leaving today. Press "B" on the elevator. The lab is located at the first door on the left as you exit the elevator.  Miralax is an osmotic laxative.  It only brings more water into the stool.  This is safe to take daily.  Can take up to 17 gram of miralax twice a day.  Mix with juice or coffee.  Start 1 capful at night for 3-4 days and reassess your response in 3-4 days.  You can increase and decrease the dose based on your response.  Remember, it can take up to 3-4 days to take effect OR for the effects to wear off.   I often pair this with benefiber in the morning to help assure the stool is not too loose.   First do a trial off milk/lactose products if you use them.  Add fiber like benefiber or citracel once a day Increase activity Can do trial of IBGard which is over the counter for AB pain- Take 1-2 capsules once a day for maintence or twice a day during a flare Please try to decrease stress. consider talking with PCP about anti anxiety medication or try head space app for meditation. if any worsening symptoms like blood in stool, weight loss, please call the office     FODMAP stands for fermentable oligo-, di-, mono-saccharides and polyols (1). These are the scientific terms used to classify groups of carbs that are difficult for our body to digest and that are notorious for triggering  digestive symptoms like bloating, gas, loose stools and stomach pain.   You can try low FODMAP diet  - start with eliminating just one column at a time that you feel may be a trigger for you. - the table at the very bottom contains foods that are low in FODMAPs   Sometimes trying to eliminate the FODMAP's from your diet is difficult or tricky, if you are stuggling with trying to do the elimination diet you can try an enzyme.  There is a food enzymes that you sprinkle in or on your food that helps break down the FODMAP. You can read more about the enzyme by going to this site: https://fodzyme.com/    Diverticulosis Diverticulosis is a condition that develops when small pouches (diverticula) form in the wall of the large intestine (colon). The colon is where water is absorbed and stool (feces) is formed. The pouches form when the inside layer of the colon pushes through weak spots in the outer layers of the colon. You may have a few pouches or many of them. The pouches usually do not cause problems unless they become inflamed or infected. When this happens, the condition is called diverticulitis- this is left lower quadrant pain, diarrhea, fever, chills, nausea or vomiting.  If this occurs please call the office or go to the hospital. Sometimes these patches without inflammation can also have painless bleeding associated with them,  if this happens please call the office or go to the hospital. Preventing constipation and increasing fiber can help reduce diverticula and prevent complications. Even if you feel you have a high-fiber diet, suggest getting on Benefiber or Cirtracel 2 times daily. _____________________________________________________ If your blood pressure at your visit was 140/90 or greater, please contact your primary care physician to follow up on this ______________________________________________________ If you are age 68 or older, your body mass index should be between 23-30.  Your Body mass index is 23.87 kg/m. If this is out of the aforementioned range listed, please consider follow up with your Primary Care Provider. If you are age 59 or younger, your body mass index should be between 19-25. Your Body mass index is 23.87 kg/m. If this is out of the aformentioned range listed, please consider follow up with your Primary Care Provider. ________________________________________________________ The [facility] providers would like to encourage you to use [facility] to communicate with providers for non-urgent requests or questions.  Due to long hold times on the telephone, sending your provider a message by [facility] may be a faster and more efficient way to get a response.  Please allow 48 business hours for a response.  Please remember that this is for non-urgent requests. _______________________________________________________ It was a pleasure to see you today! Thank you for trusting me with your gastrointestinal care!

## 2023-01-14 LAB — TISSUE TRANSGLUTAMINASE, IGA: (tTG) Ab, IgA: <1 U/mL

## 2023-01-14 LAB — IGA: Immunoglobulin A: 83 mg/dL (ref 70–320)

## 2023-01-19 ENCOUNTER — Ambulatory Visit (HOSPITAL_COMMUNITY): Payer: Medicare Other

## 2023-01-20 ENCOUNTER — Telehealth: Payer: Self-pay | Admitting: *Deleted

## 2023-01-20 NOTE — Telephone Encounter (Signed)
Patient called to notify of results, unable to contact. LMTCB.

## 2023-01-20 NOTE — Telephone Encounter (Signed)
Patient called back. Informed the patient the Celiac is negative. Patient wanted to know what that was. In formed the patient that Celiac Disease is an immune reaction to eating gluten, found in wheat, barley, and rye. Also informed her that eating gluten over time can cause inflammation that damages the small intestine lining. Patient was satisfied with the knowledge of not having Celiac.

## 2023-01-24 ENCOUNTER — Ambulatory Visit (HOSPITAL_COMMUNITY)
Admission: RE | Admit: 2023-01-24 | Discharge: 2023-01-24 | Disposition: A | Discharge status: Home / Self Care | Payer: Medicare Other | Source: Ambulatory Visit | Attending: Physician Assistant | Admitting: Physician Assistant

## 2023-01-24 DIAGNOSIS — K5904 Chronic idiopathic constipation: Secondary | ICD-10-CM | POA: Diagnosis not present

## 2023-01-24 DIAGNOSIS — R932 Abnormal findings on diagnostic imaging of liver and biliary tract: Secondary | ICD-10-CM | POA: Diagnosis not present

## 2023-01-24 DIAGNOSIS — R140 Abdominal distension (gaseous): Secondary | ICD-10-CM | POA: Diagnosis not present

## 2023-01-24 DIAGNOSIS — R109 Unspecified abdominal pain: Secondary | ICD-10-CM | POA: Diagnosis not present

## 2023-01-27 DIAGNOSIS — N2581 Secondary hyperparathyroidism of renal origin: Secondary | ICD-10-CM | POA: Diagnosis not present

## 2023-01-27 DIAGNOSIS — D631 Anemia in chronic kidney disease: Secondary | ICD-10-CM | POA: Diagnosis not present

## 2023-01-27 DIAGNOSIS — R809 Proteinuria, unspecified: Secondary | ICD-10-CM | POA: Diagnosis not present

## 2023-01-27 DIAGNOSIS — N1831 Chronic kidney disease, stage 3a: Secondary | ICD-10-CM | POA: Diagnosis not present

## 2023-01-27 DIAGNOSIS — I129 Hypertensive chronic kidney disease with stage 1 through stage 4 chronic kidney disease, or unspecified chronic kidney disease: Secondary | ICD-10-CM | POA: Diagnosis not present

## 2023-02-05 ENCOUNTER — Other Ambulatory Visit: Payer: Self-pay | Admitting: Nurse Practitioner

## 2023-02-05 DIAGNOSIS — M8580 Other specified disorders of bone density and structure, unspecified site: Secondary | ICD-10-CM

## 2023-02-06 ENCOUNTER — Ambulatory Visit
Admission: RE | Admit: 2023-02-06 | Discharge: 2023-02-06 | Disposition: A | Discharge status: Home / Self Care | Payer: Medicare Other | Source: Ambulatory Visit | Attending: Adult Health | Admitting: Adult Health

## 2023-02-06 DIAGNOSIS — M8588 Other specified disorders of bone density and structure, other site: Secondary | ICD-10-CM | POA: Diagnosis not present

## 2023-02-06 DIAGNOSIS — E349 Endocrine disorder, unspecified: Secondary | ICD-10-CM | POA: Diagnosis not present

## 2023-02-06 DIAGNOSIS — M8580 Other specified disorders of bone density and structure, unspecified site: Secondary | ICD-10-CM

## 2023-02-17 ENCOUNTER — Ambulatory Visit: Payer: Medicare Other | Admitting: Nurse Practitioner

## 2023-02-17 ENCOUNTER — Telehealth: Payer: Self-pay | Admitting: Nurse Practitioner

## 2023-02-17 ENCOUNTER — Encounter: Payer: Self-pay | Admitting: Nurse Practitioner

## 2023-02-17 VITALS — BP 135/80 | HR 64 | Temp 99.0°F | Ht 66.5 in

## 2023-02-17 DIAGNOSIS — U071 COVID-19: Secondary | ICD-10-CM | POA: Diagnosis not present

## 2023-02-17 MED ORDER — DEXAMETHASONE 1 MG PO TABS: ORAL_TABLET | ORAL | 0 refills | Status: DC

## 2023-02-17 MED ORDER — AZITHROMYCIN 250 MG PO TABS
ORAL_TABLET | ORAL | 1 refills | Status: AC
Start: 2023-02-17 — End: ?

## 2023-02-17 MED ORDER — PROMETHAZINE-DM 6.25-15 MG/5ML PO SYRP: 5.0000 mL | ORAL_SOLUTION | Freq: Four times a day (QID) | ORAL | 1 refills | Status: DC | PRN

## 2023-02-17 NOTE — Progress Notes (Signed)
THIS ENCOUNTER IS A VIRTUAL VISIT DUE TO COVID-19 - PATIENT WAS NOT SEEN IN THE OFFICE.  PATIENT HAS CONSENTED TO VIRTUAL VISIT / TELEMEDICINE VISIT   Virtual Visit via telephone Note  I connected with  Katie Cox on 02/17/2023 by telephone.  I verified that I am speaking with the correct person using two identifiers.    I discussed the limitations of evaluation and management by telemedicine and the availability of in person appointments. The patient expressed understanding and agreed to proceed.  History of Present Illness:  BP 135/80   Pulse 64   Temp 99 F (37.2 C)   Ht 5' 6.5" (1.689 m)   BMI 24.96 kg/m  67 y.o. patient contacted office reporting URI sx Congestion, nonproductive cough. she tested positive by home covid test. OV was conducted by telephone to minimize exposure. This patient was vaccinated for covid 19, last 08/30/19  Sx began 2 days ago with Headache, body aches, sore throat, vomiting, stomach pain , fatigue,  nonproductive cough and chills  Treatments tried so far: Mucinex and Tylenol  Exposures: Unknown   Medications   Current Outpatient Medications (Cardiovascular):    bumetanide (BUMEX) 2 MG tablet, Take 2 mg by mouth daily.   losartan (COZAAR) 25 MG tablet, TAKE 1 TABLET BY MOUTH  DAILY FOR KIDNEYS/PROTEIN.   rosuvastatin (CRESTOR) 20 MG tablet, Take   1 tablet Daily for Cholesterol                                                          /                                                    TAKE                                          BY                                           MOUTH  Current Outpatient Medications (Respiratory):    guaiFENesin (MUCINEX PO), Take by mouth.   fluticasone (FLONASE) 50 MCG/ACT nasal spray, Place 1 spray into both nostrils daily. (Patient taking differently: Place 1 spray into both nostrils daily as needed.)  Current Outpatient Medications (Analgesics):    Acetaminophen (TYLENOL PO), Take by mouth.   allopurinol  (ZYLOPRIM) 300 MG tablet, TAKE 1 TABLET BY MOUTH DAILY FOR GOUT PREVENTION   aspirin 81 MG tablet, Take 81 mg by mouth every other day.   Current Outpatient Medications (Hematological):    Cyanocobalamin (B-12 PO), Take by mouth daily.  Current Outpatient Medications (Other):    Ascorbic Acid (VITAMIN C WITH ROSE HIPS) 1000 MG tablet, Take 1,000 mg by mouth daily.   busPIRone (BUSPAR) 5 MG tablet, TAKE 1 TABLET BY MOUTH UP TO 3  TIMES DAILY AS NEEDED FOR  ANXIETY   Cholecalciferol (VITAMIN D3) 125 MCG (5000 UT) CAPS, Take by mouth daily.  famotidine (PEPCID) 40 MG tablet, TAKE 1 TABLET BY MOUTH AT  BEDTIME TO PREVENT INDIGESTION  AND HEARTBURN   magnesium gluconate (MAGONATE) 500 MG tablet, Take 500 mg by mouth 2 (two) times daily.   Multiple Vitamin (MULTIVITAMIN) tablet, Take 1 tablet by mouth daily.   OVER THE COUNTER MEDICATION, Takes OTC Allegra for allergies.   potassium chloride SA (KLOR-CON M) 20 MEQ tablet, TAKE 1 TABLET BY MOUTH  TWICE DAILY FOR POTASSIUM   Zinc 50 MG CAPS, Take by mouth daily.  Allergies:  Allergies  Allergen Reactions   Alprazolam Other (See Comments)     Unknown reaction per pt   Lexapro [Escitalopram]     Diarrhea/nausea    Prednisone     Only high doses    Problem list She has History of colon polyps; Irritable bowel syndrome; GERD (gastroesophageal reflux disease); Hyperlipidemia, mixed; Essential hypertension; Vitamin D deficiency; Peripheral neuropathy; Hyperuricemia; Abnormal glucose; FHx: heart disease; Anxiety; CKD (chronic kidney disease) stage 3, GFR 30-59 ml/min (HCC); Osteopenia; and Obesity (BMI 30.0-34.9) on their problem list.   Social History:   reports that she has never smoked. She has never used smokeless tobacco. She reports that she does not drink alcohol and does not use drugs.  Observations/Objective:  General : Well sounding patient in no apparent distress HEENT: no hoarseness, no cough for duration of visit Lungs: speaks in  complete sentences, no audible wheezing, no apparent distress Neurological: alert, oriented x 3 Psychiatric: pleasant, judgement appropriate   Assessment and Plan:  Covid 19 Covid 19 positive per rapid screening test at home yesterday Risk factors include: HTN, HLD Symptoms are: mild Immue support reviewed Vit C, Vit D and Zinc Take tylenol PRN temp 101+ Push hydration Regular ambulation or calf exercises exercises for clot prevention and 81 mg ASA unless contraindicated Sx supportive therapy suggested Follow up via mychart or telephone if needed Advised patient obtain O2 monitor; present to ED if persistently <90% or with severe dyspnea, CP, fever uncontrolled by tylenol, confusion, sudden decline       Should remain in isolation 5 days from testing positive and then wear a mask when around other people for the following 5 days  COVID -     dexamethasone (DECADRON) 1 MG tablet; Take 3 tabs for 3 days, 2 tabs for 3 days 1 tab for 5 days. Take with food. -     promethazine-dextromethorphan (PROMETHAZINE-DM) 6.25-15 MG/5ML syrup; Take 5 mLs by mouth 4 (four) times daily as needed for cough. -     azithromycin (ZITHROMAX) 250 MG tablet; Take 2 tablets (500 mg) on  Day 1,  followed by 1 tablet (250 mg) once daily on Days 2 through 5.     Follow Up Instructions:  I discussed the assessment and treatment plan with the patient. The patient was provided an opportunity to ask questions and all were answered. The patient agreed with the plan and demonstrated an understanding of the instructions.   The patient was advised to call back or seek an in-person evaluation if the symptoms worsen or if the condition fails to improve as anticipated.  I provided 15 minutes of non-face-to-face time during this encounter.   Raynelle Dick, NP

## 2023-02-17 NOTE — Telephone Encounter (Signed)
Pt tested positive for Covid yesterday. She was wondering if there is anything she can do to help the process? Also asked if Pam could call her.

## 2023-02-17 NOTE — Patient Instructions (Signed)
COVID  Immue support reviewed Vit C, Vit D and Zinc Take tylenol PRN temp 101+ Push hydration Regular ambulation or calf exercises exercises for clot prevention and 81 mg ASA unless contraindicated Sx supportive therapy suggested Follow up via mychart or telephone if needed Advised patient obtain O2 monitor; present to ED if persistently <90% or with severe dyspnea, CP, fever uncontrolled by tylenol, confusion, sudden decline       Should remain in isolation 5 days from testing positive and then wear a mask when around other people for the following 5 days -     dexamethasone (DECADRON) 1 MG tablet; Take 3 tabs for 3 days, 2 tabs for 3 days 1 tab for 5 days. Take with food. -     promethazine-dextromethorphan (PROMETHAZINE-DM) 6.25-15 MG/5ML syrup; Take 5 mLs by mouth 4 (four) times daily as needed for cough. -     azithromycin (ZITHROMAX) 250 MG tablet; Take 2 tablets (500 mg) on  Day 1,  followed by 1 tablet (250 mg) once daily on Days 2 through 5.

## 2023-02-17 NOTE — Telephone Encounter (Signed)
Made a phone visit appointment.

## 2023-02-20 ENCOUNTER — Encounter: Payer: Self-pay | Admitting: Nurse Practitioner

## 2023-03-16 ENCOUNTER — Other Ambulatory Visit: Payer: Self-pay | Admitting: Nurse Practitioner

## 2023-03-16 DIAGNOSIS — I10 Essential (primary) hypertension: Secondary | ICD-10-CM

## 2023-03-23 NOTE — Progress Notes (Unsigned)
MEDICARE ANNUAL WELLNESS VISIT AND FOLLOW UP  Assessment:   Katie Cox was seen today for medicare wellness.  Diagnoses and all orders for this visit:  Encounter for Medicare Annual Wellness Visit Yearly Mammogram due this month will schedule Send lab results to Dr. Thedore Mins  Essential hypertension Continue Losartan 25 mg every day  Monitor blood pressure at home; call if consistently over 130/80 Continue DASH diet.   Reminder to go to the ER if any CP, SOB, nausea, dizziness, severe HA, changes vision/speech, left arm numbness and tingling and jaw pain. -     CBC with Differential/Platelet -     COMPLETE METABOLIC PANEL WITH GFR  Hyperlipidemia, mixed Continue medications: rosuvastatin 20mg  Discussed dietary and exercise modifications Low fat diet -     Lipid panel  Abnormal glucose Discussed dietary and exercise modifications -    CMP  Vitamin D deficiency Continue supplementation to maintain goal of 70-100  Gastroesophageal reflux disease, unspecified whether esophagitis present Doing well at this time Continue: famotidine 40mg  Diet discussed Monitor for triggers Avoid food with high acid content Avoid excessive cafeine Increase water intake  Anemia - Iron, TIBC, ferritin panel - CBC  BMI 24.0-24.9, adult Discussed dietary and exercise modifications  Estrogen deficiency Dexa -1.5 Osteopenia- continue Vit D supplementation and weight bearing exercises  Anxiety Doing well at this time Continue to monitor  Medication management - Magnesium  Hyperuricemia Continue allopurinol 300mg  daily No recent flares Discussed dietary modifications - uric acid  Stage 3a chronic kidney disease (HCC) Increase fluids  Avoid NSAIDS Blood pressure control Monitor sugars  Sees Dr. Thedore Mins at nephrology  Edema bilateral lower extremities Has Bumex 2mg  PRN to take potassium 20 meq when taking medication Take one tablet 1 time a day as needed.  Flu vaccine need High  dose flu vaccine given   Over 40 minutes of face to face interview, exam, counseling, chart review and critical decision making was performed Future Appointments  Date Time Provider Department Center  07/02/2023  3:00 PM Javier Glazier [facility]  12/15/2023  3:00 PM Raynelle Dick, NP [facility] None  03/23/2024 11:00 AM Raynelle Dick, NP [facility] None     Plan:   During the course of the visit the patient was educated and counseled about appropriate screening and preventive services including:   Pneumococcal vaccine  Prevnar 13 Influenza vaccine Td vaccine Screening electrocardiogram Bone densitometry screening Colorectal cancer screening Diabetes screening Glaucoma screening Nutrition counseling  Advanced directives: requested   Subjective:  Katie Cox is a 67 y.o. female who presents for Medicare Annual Wellness Visit and 3 month follow up. has History of colon polyps; Irritable bowel syndrome; GERD (gastroesophageal reflux disease); Hyperlipidemia, mixed; Essential hypertension; Vitamin D deficiency; Peripheral neuropathy; Hyperuricemia; Abnormal glucose; FHx: heart disease; Anxiety; CKD (chronic kidney disease) stage 3, GFR 30-59 ml/min (HCC); Osteopenia; and Obesity (BMI 30.0-34.9) on their problem list.   She is scheduling an appointment to have her hearing reevaluated.  [facility]   She currently uses Famotidine at bedtime and does control GERD symptoms  She complains of increased fatigue since Covid 02/17/23. She does try to do some walking in the outside  Right rotator cuff repair 05/08/23.  Pain has improved and good ROM  She does have a history of gout and is on allopurinol 300 mg every day . Last uric acid was: Lab Results  Component Value Date   LABURIC 3.2 12/11/2021    She does take  Bumex 2 mg every day for edema. She states feet will swell occasionally.  She does take KCLO-CON 20 meq daily.  Denies muscle cramping.    BMI is Body mass index is 24.61 kg/m., she has not been working on diet and exercise. Wt Readings from Last 3 Encounters:  03/24/23 154 lb 12.8 oz (70.2 kg)  01/13/23 157 lb (71.2 kg)  12/15/22 155 lb 12.8 oz (70.7 kg)     She has had elevated blood pressure for  years. Her blood pressure has been controlled at home currently on Losartan , today their BP is BP: 102/70 mg every day  BP Readings from Last 3 Encounters:  03/24/23 102/70  02/17/23 135/80  01/13/23 110/60  She does not workout. She denies chest pain, shortness of breath, dizziness.   She is on cholesterol medication, Rosuvastatin 20 mg every day  and denies myalgias. Her cholesterol is at goal. The cholesterol last visit was:   Lab Results  Component Value Date   CHOL 140 12/15/2022   HDL 64 12/15/2022   LDLCALC 53 12/15/2022   TRIG 149 12/15/2022   CHOLHDL 2.2 12/15/2022    She has not been working on diet and exercise for abnormal glucose, and denies polydipsia, polyuria, visual disturbances, vomiting and weight loss. Last A1C in the office was:  Lab Results  Component Value Date   HGBA1C 5.4 12/15/2022   She is drinking lots of water. Last GFR: Lab Results  Component Value Date   EGFR 54 (L) 12/15/2022    Patient is on Vitamin D supplement.   Lab Results  Component Value Date   VD25OH 87 12/15/2022      Medication Review: Current Outpatient Medications on File Prior to Visit  Medication Sig Dispense Refill   Acetaminophen (TYLENOL PO) Take by mouth.     allopurinol (ZYLOPRIM) 300 MG tablet TAKE 1 TABLET BY MOUTH DAILY FOR GOUT PREVENTION 100 tablet 2   Ascorbic Acid (VITAMIN C WITH ROSE HIPS) 1000 MG tablet Take 1,000 mg by mouth daily.     aspirin 81 MG tablet Take 81 mg by mouth every other day.      bumetanide (BUMEX) 2 MG tablet Take 2 mg by mouth daily.     busPIRone (BUSPAR) 5 MG tablet TAKE 1 TABLET BY MOUTH UP TO 3  TIMES DAILY AS NEEDED FOR  ANXIETY 240 tablet 1   Cholecalciferol (VITAMIN  D3) 125 MCG (5000 UT) CAPS Take by mouth daily.     Cyanocobalamin (B-12 PO) Take by mouth daily.     famotidine (PEPCID) 40 MG tablet TAKE 1 TABLET BY MOUTH AT  BEDTIME TO PREVENT INDIGESTION  AND HEARTBURN 100 tablet 2   guaiFENesin (MUCINEX PO) Take by mouth.     losartan (COZAAR) 25 MG tablet TAKE 1 TABLET BY MOUTH  DAILY FOR KIDNEYS/PROTEIN. 100 tablet 2   magnesium gluconate (MAGONATE) 500 MG tablet Take 500 mg by mouth 2 (two) times daily.     Multiple Vitamin (MULTIVITAMIN) tablet Take 1 tablet by mouth daily.     OVER THE COUNTER MEDICATION Takes OTC Allegra for allergies.     potassium chloride SA (KLOR-CON M) 20 MEQ tablet TAKE 1 TABLET BY MOUTH TWICE  DAILY FOR POTASSIUM 200 tablet 2   rosuvastatin (CRESTOR) 20 MG tablet Take   1 tablet Daily for Cholesterol                                                          /  TAKE                                          BY                                           MOUTH 90 tablet 3   Zinc 50 MG CAPS Take by mouth daily.     fluticasone (FLONASE) 50 MCG/ACT nasal spray Place 1 spray into both nostrils daily. (Patient taking differently: Place 1 spray into both nostrils daily as needed.)     No current facility-administered medications on file prior to visit.    Allergies  Allergen Reactions   Alprazolam Other (See Comments)     Unknown reaction per pt   Lexapro [Escitalopram]     Diarrhea/nausea    Prednisone     Only high doses    Current Problems (verified) Patient Active Problem List   Diagnosis Date Noted   Obesity (BMI 30.0-34.9) 12/11/2021   CKD (chronic kidney disease) stage 3, GFR 30-59 ml/min (HCC) 11/29/2020   Osteopenia 11/29/2020   Anxiety 08/23/2019   FHx: heart disease 04/21/2018   Abnormal glucose 02/05/2015   Hyperuricemia 08/13/2014   GERD (gastroesophageal reflux disease)    Hyperlipidemia, mixed    Essential hypertension    Vitamin D deficiency     Peripheral neuropathy    History of colon polyps 10/18/2007   Irritable bowel syndrome 10/18/2007    Screening Tests Immunization History  Administered Date(s) Administered   Influenza Inj Mdck Quad With Preservative 04/21/2018, 05/18/2019, 06/07/2020   Influenza, High Dose Seasonal PF 06/05/2021, 05/21/2022   PFIZER(Purple Top)SARS-COV-2 Vaccination 08/30/2019, 09/19/2019, 10/12/2019   PPD Test 01/25/2014, 02/05/2015, 02/27/2016, 04/21/2018, 05/18/2019   Pneumococcal Conjugate-13 08/13/2020   Pneumococcal Polysaccharide-23 11/23/2008, 12/11/2021   Td 11/29/2020   Tdap 11/29/2009   Unspecified SARS-COV-2 Vaccination 09/19/2019   Zoster Recombinant(Shingrix) 09/22/2020   Health Maintenance  Topic Date Due   INFLUENZA VACCINE  01/22/2023   COVID-19 Vaccine (5 - 2023-24 season) 04/09/2023 (Originally 02/22/2023)   Zoster Vaccines- Shingrix (2 of 2) 06/24/2023 (Originally 11/17/2020)   MAMMOGRAM  04/22/2023   Medicare Annual Wellness (AWV)  03/23/2024   Colonoscopy  08/31/2024   DTaP/Tdap/Td (3 - Td or Tdap) 11/30/2030   Pneumonia Vaccine 25+ Years old  Completed   DEXA SCAN  Completed   Hepatitis C Screening  Completed   HPV VACCINES  Aged Out     Names of Other Physician/Practitioners you currently use: 1. [facility] here for primary care 2. Eye Exam , [facility] 09/2022- following catarracts 3. Dental Exam, Dr. Aileen Fass 29/2024 Patient Care Team: Raynelle Dick, NP as PCP - General (Nurse Practitioner)  SURGICAL HISTORY She  has a past surgical history that includes goiter removed fro thyroid; Foot surgery; Colonoscopy; Cholecystectomy (04/14/2012); Open reduction internal fixation (orif) distal radial fracture (Right, 07/04/2015); carpel tunnel surgery (Right); Wisdom tooth extraction; Eye surgery; and Polypectomy. FAMILY HISTORY Her family history includes Arrhythmia in her mother; Breast cancer in her maternal aunt; COPD in  her paternal grandfather; Colon cancer (age of onset: 31) in her father; Colon polyps in her father; Hypertension in her mother; Macular degeneration in her brother. SOCIAL HISTORY She  reports that she has never smoked. She  has never used smokeless tobacco. She reports that she does not drink alcohol and does not use drugs.   MEDICARE WELLNESS OBJECTIVES: Physical activity:  occasional walking Cardiac risk factors: Cardiac Risk Factors include: advanced age (>79men, >3 women);dyslipidemia;hypertension;sedentary lifestyle Depression/mood screen:      03/24/2023   11:35 AM  Depression screen PHQ 2/9  Decreased Interest 0  Down, Depressed, Hopeless 0  PHQ - 2 Score 0    ADLs:     03/24/2023   11:28 AM 06/20/2022   11:12 AM  In your present state of health, do you have any difficulty performing the following activities:  Hearing? 0 0  Vision? 0 0  Difficulty concentrating or making decisions? 0 0  Walking or climbing stairs? 0 0  Dressing or bathing? 0 0  Doing errands, shopping? 0 0     Cognitive Testing  Alert? Yes  Normal Appearance?Yes  Oriented to person? Yes  Place? Yes   Time? Yes  Recall of three objects?  Yes  Can perform simple calculations? Yes  Displays appropriate judgment?Yes  Can read the correct time from a watch face?Yes  EOL planning: Does Patient Have a Medical Advance Directive?: No Would patient like information on creating a medical advance directive?: No - Patient declined  Review of Systems  Constitutional:  Negative for chills, fever and weight loss.  HENT:  Positive for hearing loss (son states she is not hearing well). Negative for congestion.   Eyes:  Negative for blurred vision and double vision.       Floaters both eyes  Respiratory:  Negative for cough and shortness of breath.   Cardiovascular:  Negative for chest pain, palpitations, orthopnea and leg swelling.  Gastrointestinal:  Negative for abdominal pain, constipation, diarrhea,  heartburn, nausea and vomiting.  Genitourinary: Negative.   Musculoskeletal:  Negative for falls, joint pain and myalgias.  Skin:  Negative for rash.  Neurological:  Negative for dizziness, tingling, tremors, loss of consciousness and headaches.  Psychiatric/Behavioral:  Negative for depression, memory loss and suicidal ideas.      Objective:     Today's Vitals   03/24/23 1053  BP: 102/70  Pulse: (!) 57  Temp: (!) 97.5 F (36.4 C)  SpO2: 98%  Weight: 154 lb 12.8 oz (70.2 kg)  Height: 5' 6.5" (1.689 m)    Body mass index is 24.61 kg/m.  General appearance: alert, no distress, WD/WN, female HEENT: normocephalic, sclerae anicteric, TMs pearly, nares patent, no discharge or erythema, pharynx normal, mild hearing impairment Oral cavity: MMM, no lesions Neck: supple, no lymphadenopathy, no thyromegaly, no masses Heart: RRR, normal S1, S2, no murmurs Lungs: CTA bilaterally, no wheezes, rhonchi, or rales Abdomen: +bs, soft, non tender, non distended, no masses, no hepatomegaly, no splenomegaly Musculoskeletal: nontender, no swelling, no obvious deformity Limited ROM of right shoulder Extremities: no edema, no cyanosis, no clubbing Pulses: 2+ symmetric, upper and lower extremities, normal cap refill Neurological: alert, oriented x 3, CN2-12 intact, strength normal upper extremities and lower extremities, sensation normal throughout, DTRs 2+ throughout, no cerebellar signs, gait normal Psychiatric: normal affect, behavior normal, pleasant      Medicare Attestation I have personally reviewed: The patient's medical and social history Their use of alcohol, tobacco or illicit drugs Their current medications and supplements The patient's functional ability including ADLs,fall risks, home safety risks, cognitive, and hearing and visual impairment Diet and physical activities Evidence for depression or mood disorders  The patient's weight, height, BMI, and visual acuity have been  recorded in the chart.  I have made referrals, counseling, and provided education to the patient based on review of the above and I have provided the patient with a written personalized care plan for preventive services.      Revonda Humphrey ANP-C  [facility]  03/24/2023

## 2023-03-24 ENCOUNTER — Ambulatory Visit (INDEPENDENT_AMBULATORY_CARE_PROVIDER_SITE_OTHER): Payer: Medicare Other | Admitting: Nurse Practitioner

## 2023-03-24 ENCOUNTER — Encounter: Payer: Self-pay | Admitting: Nurse Practitioner

## 2023-03-24 VITALS — BP 102/70 | HR 57 | Temp 97.5°F | Ht 66.5 in | Wt 154.8 lb

## 2023-03-24 DIAGNOSIS — Z0001 Encounter for general adult medical examination with abnormal findings: Secondary | ICD-10-CM

## 2023-03-24 DIAGNOSIS — E2839 Other primary ovarian failure: Secondary | ICD-10-CM

## 2023-03-24 DIAGNOSIS — R7309 Other abnormal glucose: Secondary | ICD-10-CM | POA: Diagnosis not present

## 2023-03-24 DIAGNOSIS — N1831 Chronic kidney disease, stage 3a: Secondary | ICD-10-CM | POA: Diagnosis not present

## 2023-03-24 DIAGNOSIS — R6889 Other general symptoms and signs: Secondary | ICD-10-CM

## 2023-03-24 DIAGNOSIS — D508 Other iron deficiency anemias: Secondary | ICD-10-CM

## 2023-03-24 DIAGNOSIS — E559 Vitamin D deficiency, unspecified: Secondary | ICD-10-CM | POA: Diagnosis not present

## 2023-03-24 DIAGNOSIS — R600 Localized edema: Secondary | ICD-10-CM

## 2023-03-24 DIAGNOSIS — Z79899 Other long term (current) drug therapy: Secondary | ICD-10-CM | POA: Diagnosis not present

## 2023-03-24 DIAGNOSIS — Z23 Encounter for immunization: Secondary | ICD-10-CM | POA: Diagnosis not present

## 2023-03-24 DIAGNOSIS — E782 Mixed hyperlipidemia: Secondary | ICD-10-CM

## 2023-03-24 DIAGNOSIS — K219 Gastro-esophageal reflux disease without esophagitis: Secondary | ICD-10-CM

## 2023-03-24 DIAGNOSIS — F419 Anxiety disorder, unspecified: Secondary | ICD-10-CM

## 2023-03-24 DIAGNOSIS — Z6824 Body mass index (BMI) 24.0-24.9, adult: Secondary | ICD-10-CM

## 2023-03-24 DIAGNOSIS — E790 Hyperuricemia without signs of inflammatory arthritis and tophaceous disease: Secondary | ICD-10-CM

## 2023-03-24 DIAGNOSIS — I10 Essential (primary) hypertension: Secondary | ICD-10-CM

## 2023-03-24 NOTE — Patient Instructions (Signed)

## 2023-03-25 LAB — CBC WITH DIFFERENTIAL/PLATELET
Absolute Monocytes: 525 {cells}/uL (ref 200–950)
Basophils Absolute: 95 {cells}/uL (ref 0–200)
Basophils Relative: 1.1 %
Eosinophils Absolute: 60 {cells}/uL (ref 15–500)
Eosinophils Relative: 0.7 %
HCT: 41.8 % (ref 35.0–45.0)
Hemoglobin: 13.9 g/dL (ref 11.7–15.5)
Lymphs Abs: 3311 {cells}/uL (ref 850–3900)
MCH: 32.6 pg (ref 27.0–33.0)
MCHC: 33.3 g/dL (ref 32.0–36.0)
MCV: 98.1 fL (ref 80.0–100.0)
MPV: 13.4 fL — ABNORMAL HIGH (ref 7.5–12.5)
Monocytes Relative: 6.1 %
Neutro Abs: 4610 {cells}/uL (ref 1500–7800)
Neutrophils Relative %: 53.6 %
Platelets: 297 10*3/uL (ref 140–400)
RBC: 4.26 10*6/uL (ref 3.80–5.10)
RDW: 13.1 % (ref 11.0–15.0)
Total Lymphocyte: 38.5 %
WBC: 8.6 10*3/uL (ref 3.8–10.8)

## 2023-03-25 LAB — COMPLETE METABOLIC PANEL WITH GFR
AG Ratio: 1.8 (calc) (ref 1.0–2.5)
ALT: 21 U/L (ref 6–29)
AST: 28 U/L (ref 10–35)
Albumin: 4.6 g/dL (ref 3.6–5.1)
Alkaline phosphatase (APISO): 73 U/L (ref 37–153)
BUN/Creatinine Ratio: 21 (calc) (ref 6–22)
BUN: 22 mg/dL (ref 7–25)
CO2: 24 mmol/L (ref 20–32)
Calcium: 10 mg/dL (ref 8.6–10.4)
Chloride: 105 mmol/L (ref 98–110)
Creat: 1.06 mg/dL — ABNORMAL HIGH (ref 0.50–1.05)
Globulin: 2.5 g/dL (ref 1.9–3.7)
Glucose, Bld: 73 mg/dL (ref 65–99)
Potassium: 4.5 mmol/L (ref 3.5–5.3)
Sodium: 142 mmol/L (ref 135–146)
Total Bilirubin: 1.1 mg/dL (ref 0.2–1.2)
Total Protein: 7.1 g/dL (ref 6.1–8.1)
eGFR: 57 mL/min/{1.73_m2} — ABNORMAL LOW (ref >=60)

## 2023-03-25 LAB — LIPID PANEL
Cholesterol: 152 mg/dL (ref <200)
HDL: 62 mg/dL (ref >=50)
LDL Cholesterol (Calc): 68 mg/dL
Non-HDL Cholesterol (Calc): 90 mg/dL (ref <130)
Total CHOL/HDL Ratio: 2.5 (calc) (ref <5.0)
Triglycerides: 141 mg/dL (ref <150)

## 2023-03-25 LAB — IRON,TIBC AND FERRITIN PANEL
%SAT: 40 % (ref 16–45)
Ferritin: 37 ng/mL (ref 16–288)
Iron: 152 ug/dL (ref 45–160)
TIBC: 379 ug/dL (ref 250–450)

## 2023-03-25 LAB — URIC ACID: Uric Acid, Serum: 3.6 mg/dL (ref 2.5–7.0)

## 2023-03-26 ENCOUNTER — Other Ambulatory Visit: Payer: Self-pay | Admitting: Obstetrics and Gynecology

## 2023-03-26 DIAGNOSIS — Z1231 Encounter for screening mammogram for malignant neoplasm of breast: Secondary | ICD-10-CM

## 2023-03-27 ENCOUNTER — Ambulatory Visit: Payer: Medicare Other | Admitting: Physician Assistant

## 2023-03-29 ENCOUNTER — Encounter: Payer: Self-pay | Admitting: Nurse Practitioner

## 2023-04-12 ENCOUNTER — Other Ambulatory Visit: Payer: Self-pay | Admitting: Nurse Practitioner

## 2023-04-22 ENCOUNTER — Ambulatory Visit
Admission: RE | Admit: 2023-04-22 | Discharge: 2023-04-22 | Disposition: A | Discharge status: Home / Self Care | Payer: Medicare Other | Source: Ambulatory Visit | Attending: Obstetrics and Gynecology | Admitting: Obstetrics and Gynecology

## 2023-04-22 DIAGNOSIS — Z1231 Encounter for screening mammogram for malignant neoplasm of breast: Secondary | ICD-10-CM

## 2023-05-19 ENCOUNTER — Encounter: Payer: Self-pay | Admitting: Nurse Practitioner

## 2023-06-27 ENCOUNTER — Other Ambulatory Visit: Payer: Self-pay | Admitting: Nurse Practitioner

## 2023-06-27 DIAGNOSIS — E790 Hyperuricemia without signs of inflammatory arthritis and tophaceous disease: Secondary | ICD-10-CM

## 2023-06-27 DIAGNOSIS — K219 Gastro-esophageal reflux disease without esophagitis: Secondary | ICD-10-CM

## 2023-07-02 ENCOUNTER — Ambulatory Visit: Payer: Medicare Other | Admitting: Physician Assistant

## 2023-07-06 ENCOUNTER — Ambulatory Visit: Payer: Medicare Other | Admitting: Internal Medicine

## 2023-07-10 NOTE — Progress Notes (Unsigned)
07/13/2023 Katie Cox [id number] 01-09-1956  Referring provider: Lucky Cowboy, MD Primary GI doctor: Dr. Rhea Belton  ASSESSMENT AND PLAN:   Abdominal bloating with CIC No red flags, AB [place] negative Never did trial of linzess, retry linzess if this is not helpful will consider testing versus treating for SIBO Can do trial of IBGARD daily FODMAP,  and lifestyle changes discussed Follow up 2-3 months or sooner if symptoms get worse or any new symptoms  Gastroesophageal reflux disease without esophagitis Lifestyle changes discussed, avoid NSAIDS, ETOH Continue pepcid as needed, well controlled at this time  History of adenomatous polyp of colon Recall 08/2024  Diverticulosis Will call if any symptoms. Add on fiber supplement, avoid NSAIDS, information given   Patient Care Team: Lucky Cowboy, MD as PCP - General (Internal Medicine) Lucky Cowboy, MD as Referring Physician (Internal Medicine)  HISTORY OF PRESENT ILLNESS: 68 y.o. female with a past medical history of hypertension, hyperlipidemia, anxiety, CKD stage III, peripheral neuropathy, IBS, personal history of colon polyps and GERD and others listed below presents for evaluation of abdominal pain/bloating.  09/01/2019 colonoscopy Dr. Rhea Belton for personal history of TA polyps, good bowel prep post polypectomy scar seen at tattoo site hepatic flexure, 2 polyps 2 to 5 mm transverse colon, diverticulosis, small internal hemorrhoids.  TA polyps  Recall 08/2024 01/13/2023 office visit with myself for abdominal bloating and discomfort high suspicion for CIC started MiraLAX/fiber samples of Linzess given.  Trial of IBgard.   Consider SIBO testing versus Xifaxan. 01/24/2023 abdominal ultrasound showed hepatic steatosis otherwise unremarkable status post cholecystectomy Negative celiac sed rate and CRP.  Patient without iron deficiency anemia 03/2023  She does not recall taking the linzess.  She is on the senokot at night  and metamucil as needed. She states she has had She states "anything" she eats will cause bloating. Has increased gas. She has BM every day, soft stools.  Can be with salads, broccoli.  Denies nausea, vomiting, GERD, fever, chills.  Has gained weight, no weight loss.   She  reports that she has never smoked. She has never used smokeless tobacco. She reports that she does not drink alcohol and does not use drugs.  RELEVANT LABS AND IMAGING: CBC    Component Value Date/Time   WBC 8.6 03/24/2023 1149   RBC 4.26 03/24/2023 1149   HGB 13.9 03/24/2023 1149   HGB 12.3 07/21/2008 1509   HGB 13.1 10/10/2005 1541   HCT 41.8 03/24/2023 1149   HCT 35.0 07/21/2008 1509   HCT 36.9 10/10/2005 1541   PLT 297 03/24/2023 1149   PLT 391 07/21/2008 1509   PLT 410 (H) 10/10/2005 1541   MCV 98.1 03/24/2023 1149   MCV 90 07/21/2008 1509   MCV 92.7 10/10/2005 1541   MCH 32.6 03/24/2023 1149   MCHC 33.3 03/24/2023 1149   RDW 13.1 03/24/2023 1149   RDW 10.9 07/21/2008 1509   RDW 11.9 10/10/2005 1541   LYMPHSABS 3,311 03/24/2023 1149   LYMPHSABS 5.0 (H) 07/21/2008 1509   LYMPHSABS 5.4 (H) 10/10/2005 1541   MONOABS 570 01/05/2017 1224   MONOABS 0.9 10/10/2005 1541   EOSABS 60 03/24/2023 1149   EOSABS 0.3 07/21/2008 1509   BASOSABS 95 03/24/2023 1149   BASOSABS 0.1 07/21/2008 1509   BASOSABS 0.1 10/10/2005 1541   Recent Labs    12/15/22 1544 03/24/23 1149  HGB 13.7 13.9    CMP     Component Value Date/Time   NA 142 03/24/2023 1149  K 4.5 03/24/2023 1149   CL 105 03/24/2023 1149   CO2 24 03/24/2023 1149   GLUCOSE 73 03/24/2023 1149   BUN 22 03/24/2023 1149   CREATININE 1.06 (H) 03/24/2023 1149   CALCIUM 10.0 03/24/2023 1149   PROT 7.1 03/24/2023 1149   ALBUMIN 4.4 01/05/2017 1224   AST 28 03/24/2023 1149   ALT 21 03/24/2023 1149   ALKPHOS 71 01/05/2017 1224   BILITOT 1.1 03/24/2023 1149   GFRNONAA 60 (L) 07/04/2021 1348   GFRNONAA 56 (L) 11/29/2020 0000   GFRAA 65 11/29/2020  0000      Latest Ref Rng & Units 03/24/2023   11:49 AM 12/15/2022    3:44 PM 06/20/2022   12:00 AM  Hepatic Function  Total Protein 6.1 - 8.1 g/dL 7.1  6.9  7.4   AST 10 - 35 U/L 28  28  30    ALT 6 - 29 U/L 21  21  24    Total Bilirubin 0.2 - 1.2 mg/dL 1.1  0.7  0.9       Current Medications:    Current Outpatient Medications (Cardiovascular):    bumetanide (BUMEX) 2 MG tablet, TAKE 1 TABLET BY MOUTH TWICE  DAILY FOR BLOOD PRESSURE AND  FLUID   losartan (COZAAR) 25 MG tablet, TAKE 1 TABLET BY MOUTH  DAILY FOR KIDNEYS/PROTEIN.   rosuvastatin (CRESTOR) 20 MG tablet, Take   1 tablet Daily for Cholesterol                                                          /                                                    TAKE                                          BY                                           MOUTH  Current Outpatient Medications (Respiratory):    fluticasone (FLONASE) 50 MCG/ACT nasal spray, Place 1 spray into both nostrils daily. (Patient taking differently: Place 1 spray into both nostrils daily as needed.)  Current Outpatient Medications (Analgesics):    Acetaminophen (TYLENOL PO), Take by mouth.   allopurinol (ZYLOPRIM) 300 MG tablet, TAKE 1 TABLET BY MOUTH DAILY FOR GOUT PREVENTION   aspirin 81 MG tablet, Take 81 mg by mouth every other day.   Current Outpatient Medications (Hematological):    Cyanocobalamin (B-12 PO), Take by mouth daily.  Current Outpatient Medications (Other):    Ascorbic Acid (VITAMIN C WITH ROSE HIPS) 1000 MG tablet, Take 1,000 mg by mouth daily.   busPIRone (BUSPAR) 5 MG tablet, TAKE 1 TABLET BY MOUTH UP TO 3  TIMES DAILY AS NEEDED FOR  ANXIETY   Cholecalciferol (VITAMIN D3) 125 MCG (5000 UT) CAPS, Take by mouth daily.   famotidine (PEPCID) 40 MG tablet,  TAKE 1 TABLET BY MOUTH AT  BEDTIME TO PREVENT INDIGESTION  AND HEARTBURN   magnesium gluconate (MAGONATE) 500 MG tablet, Take 500 mg by mouth 2 (two) times daily.   Multiple Vitamin  (MULTIVITAMIN) tablet, Take 1 tablet by mouth daily.   OVER THE COUNTER MEDICATION, Takes OTC Allegra for allergies.   potassium chloride SA (KLOR-CON M) 20 MEQ tablet, TAKE 1 TABLET BY MOUTH TWICE  DAILY FOR POTASSIUM   Zinc 50 MG CAPS, Take by mouth daily.  Medical History:  Past Medical History:  Diagnosis Date   Abnormal glucose 02/05/2015   Allergy    seasonal   Contact lens/glasses fitting    wears contacts or glasses   Diverticulosis    GERD (gastroesophageal reflux disease)    Hyperlipidemia    Hypertension    Hyperuricemia 08/13/2014   Irritable bowel syndrome 10/18/2007   Irritable bowel syndrome (IBS)    Medication management 12/26/2013   Peripheral neuropathy    in feet   POLYP, COLON 10/18/2007   PONV (postoperative nausea and vomiting)    nausea, slow to wake up at times   Vitamin D deficiency    Allergies:  Allergies  Allergen Reactions   Alprazolam Other (See Comments)     Unknown reaction per pt   Lexapro [Escitalopram]     Diarrhea/nausea    Prednisone     Only high doses     Surgical History:  She  has a past surgical history that includes goiter removed fro thyroid; Foot surgery; Colonoscopy; Cholecystectomy (04/14/2012); Open reduction internal fixation (orif) distal radial fracture (Right, 07/04/2015); carpel tunnel surgery (Right); Wisdom tooth extraction; Eye surgery; and Polypectomy. Family History:  Her family history includes Arrhythmia in her mother; Breast cancer in her maternal aunt; COPD in her paternal grandfather; Colon cancer (age of onset: 50) in her father; Colon polyps in her father; Hypertension in her mother; Macular degeneration in her brother.  REVIEW OF SYSTEMS  :  Review of Systems  Constitutional: Negative.  Negative for chills, fever and weight loss.  HENT: Negative.    Eyes: Negative.   Respiratory: Negative.  Negative for shortness of breath.   Cardiovascular: Negative.  Negative for chest pain.  Gastrointestinal:   Positive for constipation, diarrhea and heartburn (controlled). Negative for abdominal pain (bloating), blood in stool, melena, nausea and vomiting.  Genitourinary: Negative.  Negative for dysuria and frequency.  Musculoskeletal: Negative.   Skin: Negative.   Neurological: Negative.   Endo/Heme/Allergies: Negative.   Psychiatric/Behavioral: Negative.       PHYSICAL EXAM: BP 112/70 (BP Location: Left Arm, Patient Position: Sitting, Cuff Size: Normal)   Pulse 60   Ht 5' 6.5" (1.689 m)   Wt 164 lb 3.2 oz (74.5 kg)   BMI 26.11 kg/m  General Appearance: Well nourished, in no apparent distress. Head:   Normocephalic and atraumatic. Eyes:  sclerae anicteric,conjunctive pink  Respiratory: Respiratory effort normal, BS equal bilaterally without rales, rhonchi, wheezing. Cardio: RRR with no MRGs. Peripheral pulses intact.  Abdomen: Soft,  Non-distended ,active bowel sounds. mild tenderness in the lower abdomen, some lower AB fullness with palpation, Without guarding and Without rebound. No masses. Rectal: Not evaluated Musculoskeletal: Full ROM, Normal gait. Without edema. Skin:  Dry and intact without significant lesions or rashes Neuro: Alert and  oriented x4;  No focal deficits. Psych:  Cooperative. Normal mood and affect.    Doree Albee, PA-C 1:23 PM

## 2023-07-13 ENCOUNTER — Ambulatory Visit: Payer: Medicare Other | Admitting: Physician Assistant

## 2023-07-13 VITALS — BP 112/70 | HR 60 | Ht 66.5 in | Wt 164.2 lb

## 2023-07-13 DIAGNOSIS — K5904 Chronic idiopathic constipation: Secondary | ICD-10-CM | POA: Diagnosis not present

## 2023-07-13 DIAGNOSIS — R140 Abdominal distension (gaseous): Secondary | ICD-10-CM | POA: Diagnosis not present

## 2023-07-13 DIAGNOSIS — K219 Gastro-esophageal reflux disease without esophagitis: Secondary | ICD-10-CM

## 2023-07-13 DIAGNOSIS — Z860101 Personal history of adenomatous and serrated colon polyps: Secondary | ICD-10-CM | POA: Diagnosis not present

## 2023-07-13 DIAGNOSIS — K5792 Diverticulitis of intestine, part unspecified, without perforation or abscess without bleeding: Secondary | ICD-10-CM | POA: Diagnosis not present

## 2023-07-13 DIAGNOSIS — K5730 Diverticulosis of large intestine without perforation or abscess without bleeding: Secondary | ICD-10-CM

## 2023-07-13 NOTE — Patient Instructions (Addendum)
First do a trial off milk/lactose products if you use them.  Add fiber like benefiber or citracel once a day Increase activity Can do trial of IBGard which is over the counter for AB pain- Take 1-2 capsules once a day for maintence or twice a day during a flare  Linzess 145 mcg rather than the senokot *IBS-C patients may begin to experience relief from belly pain and overall abdominal symptoms (pain, discomfort, and bloating) in about 1 week,  with symptoms typically improving over 12 weeks.  Take at least 30 minutes before the first meal of the day on an empty stomach You can have a loose stool if you eat a high-fat breakfast. Give it at least 7 days, may have more bowel movements during that time.   The diarrhea should go away and you should start having normal, complete, full bowel movements.  It may be helpful to start treatment when you can be near the comfort of your own bathroom, such as a weekend.  After you are out we can send in a prescription if you did well, there is a prescription card  IF THIS DOES NOT HELP CAN TRY TREATMENT FOR SIBO, MESSAGE OR CALL THE OFFICE AND I CAN SEND IN METRONIDAZOLE LOW DOSE We may want to evaluate you for small intestinal bacterial overgrowth, this can cause increase gas, bloating, loose stools or constipation.  There is a test for this we can do or sometimes we will treat a patient with an antibiotic to see if it helps.   I recommend that the patient abstain from all alcohol while taking metronidazole.   To avoid side effects of metronidazole, I recommend that she stick to simple meals and not eat rich or spicy food.  She should always try to take your metronidazole after a meal or snack. Most common side effects include nausea, vomiting, diarrhea, stomach upset and rash.    FODMAP stands for fermentable oligo-, di-, mono-saccharides and polyols (1). These are the scientific terms used to classify groups of carbs that are difficult for our body to  digest and that are notorious for triggering digestive symptoms like bloating, gas, loose stools and stomach pain.   You can try low FODMAP diet  - start with eliminating just one column at a time that you feel may be a trigger for you. - the table at the very bottom contains foods that are low in FODMAPs   Sometimes trying to eliminate the FODMAP's from your diet is difficult or tricky, if you are stuggling with trying to do the elimination diet you can try an enzyme.  There is a food enzymes that you sprinkle in or on your food that helps break down the FODMAP. You can read more about the enzyme by going to this site: https://fodzyme.com/   Abdominal bloating and discomfort may be due to intestinal sensitivity or symptoms of irritable bowel syndrome. To relieve symptoms, avoid:  Broccoli  Baked beans  Cabbage  Carbonated drinks  Cauliflower  Chewing gum  Hard candy Abdominal distention resulting from weak abdominal muscles:  Is better in the morning  Gets worse as the day progresses  Is relieved by lying down Flatulence is gas created through bacterial action in the bowel and passed rectally. Keep in mind that:  10-18 passages per day are normal  Primary gases are harmless and odorless  Noticeable smells are trace gases related to food intake Foods to AVOID that are likely to form gas include:  Milk, dairy  products, and medications that contain lactose--If your body doesn't produce the enzyme (lactase) to break it down.  Certain vegetables--baked beans, cauliflower, broccoli, cabbage  Certain starches--wheat, oats, corn, potatoes. Rice is a good substitute. Identify offending foods. Reduce or eliminate these gas-forming foods from your diet. Can look at the FODMAP diet.  I appreciate the  opportunity to care for you  Thank You   [facility]

## 2023-08-06 ENCOUNTER — Other Ambulatory Visit: Payer: Self-pay

## 2023-08-06 DIAGNOSIS — E782 Mixed hyperlipidemia: Secondary | ICD-10-CM

## 2023-08-06 MED ORDER — ROSUVASTATIN CALCIUM 20 MG PO TABS: ORAL_TABLET | ORAL | 0 refills | Status: DC

## 2023-08-24 ENCOUNTER — Encounter: Payer: Self-pay | Admitting: Family Medicine

## 2023-08-24 ENCOUNTER — Ambulatory Visit (INDEPENDENT_AMBULATORY_CARE_PROVIDER_SITE_OTHER): Payer: Medicare Other | Admitting: Family Medicine

## 2023-08-24 VITALS — BP 127/59 | HR 54 | Ht 66.5 in | Wt 159.0 lb

## 2023-08-24 DIAGNOSIS — N1831 Chronic kidney disease, stage 3a: Secondary | ICD-10-CM | POA: Diagnosis not present

## 2023-08-24 DIAGNOSIS — Z8249 Family history of ischemic heart disease and other diseases of the circulatory system: Secondary | ICD-10-CM | POA: Diagnosis not present

## 2023-08-24 DIAGNOSIS — Z0000 Encounter for general adult medical examination without abnormal findings: Secondary | ICD-10-CM

## 2023-08-24 DIAGNOSIS — E559 Vitamin D deficiency, unspecified: Secondary | ICD-10-CM | POA: Diagnosis not present

## 2023-08-24 DIAGNOSIS — F419 Anxiety disorder, unspecified: Secondary | ICD-10-CM

## 2023-08-24 DIAGNOSIS — M8580 Other specified disorders of bone density and structure, unspecified site: Secondary | ICD-10-CM

## 2023-08-24 DIAGNOSIS — E782 Mixed hyperlipidemia: Secondary | ICD-10-CM

## 2023-08-24 DIAGNOSIS — E790 Hyperuricemia without signs of inflammatory arthritis and tophaceous disease: Secondary | ICD-10-CM

## 2023-08-24 NOTE — Assessment & Plan Note (Signed)
 Well controlled on rosuvastatin 20mg  daily. Last cholesterol check in October 2024 was within normal limits. -Continue rosuvastatin 20mg  daily. -Repeat lipid panel today

## 2023-08-24 NOTE — Assessment & Plan Note (Signed)
 On cholecalciferol 5000 units daily. -Check Vitamin D level today.

## 2023-08-24 NOTE — Patient Instructions (Signed)
 Thank you for choosing [facility] at [facility] for your Primary Care needs. I am excited for the opportunity to partner with you to meet your health care goals. It was a pleasure meeting you today!  Information on diet, exercise, and health maintenance recommendations are listed below. This is information to help you be sure you are on track for optimal health and monitoring.   Please look over this and let us know if you have any questions or if you have completed any of the health maintenance outside of [facility] so that we can be sure your records are up to date.  ___________________________________________________________  MyChart:  For all urgent or time sensitive needs we ask that you please call the office to avoid delays. Our number is [phone]. MyChart is not constantly monitored and due to the large volume of messages a day, replies may take up to 72 business hours.  MyChart Policy: MyChart allows for you to see your visit notes, after visit summary, provider recommendations, lab and tests results, make an appointment, request refills, and contact your provider or the office for non-urgent questions or concerns. Providers are seeing patients during normal business hours and do not have built in time to review MyChart messages.  We ask that you allow a minimum of 3 business days for responses to [employer]. For this reason, please do not send urgent requests through MyChart. Please call the office at [phone]. New and ongoing conditions may require a visit. We have virtual and in-person visits available for your convenience.  Complex MyChart concerns may require a visit. Your provider may request you schedule a virtual or in-person visit to ensure we are providing the best care possible. MyChart messages sent after 11:00 AM on Friday may not be received by the provider until Monday morning.    Lab and Test Results: You will receive your lab and test  results on MyChart as soon as they are completed and results have been sent by the lab or testing facility. Due to this service, you will receive your results BEFORE your provider.  I review lab and test results each morning prior to seeing patients. Some results require collaboration with other providers to ensure you are receiving the most appropriate care. For this reason, we ask that you please allow a minimum of 3-5 business days from the time that ALL results have been received for your provider to receive and review lab and test results and contact you about these.  Most lab and test result comments from the provider will be sent through MyChart. Your provider may recommend changes to the plan of care, follow-up visits, repeat testing, ask questions, or request an office visit to discuss these results. You may reply directly to this message or call the office to provide information for the provider or set up an appointment. In some instances, you will be called with test results and recommendations. Please let us know if this is preferred and we will make note of this in your chart to provide this for you.    If you have not heard a response to your lab or test results in 5 business days from all results returning to MyChart, please call the office to let us know. We ask that you please avoid calling prior to this time unless there is an emergent concern. Due to high call volumes, this can delay the resulting process.  After Hours: For all non-emergency after hours needs, please  call the office at [phone] and select the option to reach the on-call  service. On-call services are shared between multiple [facility] offices and therefore it will not be possible to speak directly with your provider. On-call providers may provide medical advice and recommendations, but are unable to provide refills for maintenance medications.  For all emergency or urgent medical needs after normal business hours, we  recommend that you seek care at the closest Urgent Care or Emergency Department to ensure appropriate treatment in a timely manner.  [facility] has a 24 hour emergency room located on the ground floor for your convenience.   Urgent Concerns During the Business Day Providers are seeing patients from 8AM to 5PM with a busy schedule and are most often not able to respond to non-urgent calls until the end of the day or the next business day. If you should have URGENT concerns during the day, please call and speak to the nurse or schedule a same day appointment so that we can address your concern without delay.   Thank you, again, for choosing me as your health care partner. I appreciate your trust and look forward to learning more about you!   Katie Marrow Reola Calkins, DNP, FNP-C  ___________________________________________________________  Health Maintenance Recommendations Screening Testing Mammogram Every 1-2 years based on history and risk factors Starting at age 60 Pap Smear Ages 21-39 every 3 years Ages 22-65 every 5 years with HPV testing More frequent testing may be required based on results and history Colon Cancer Screening Every 1-10 years based on test performed, risk factors, and history Starting at age 29 Bone Density Screening Every 2-10 years based on history Starting at age 79 for women Recommendations for men differ based on medication usage, history, and risk factors AAA Screening One time ultrasound Men 59-28 years old who have ever smoked Lung Cancer Screening Low Dose Lung CT every 12 months Age 51-80 years with a 20 pack-year smoking history who still smoke or who have quit within the last 15 years  Screening Labs Routine  Labs: Complete Blood Count (CBC), Complete Metabolic Panel (CMP), Cholesterol (Lipid Panel) Every 6-12 months based on history and medications May be recommended more frequently based on current conditions or previous results Hemoglobin  A1c Lab Every 3-12 months based on history and previous results Starting at age 79 or earlier with diagnosis of diabetes, high cholesterol, BMI >26, and/or risk factors Frequent monitoring for patients with diabetes to ensure blood sugar control Thyroid Panel  Every 6 months based on history, symptoms, and risk factors May be repeated more often if on medication HIV One time testing for all patients 66 and older May be repeated more frequently for patients with increased risk factors or exposure Hepatitis C One time testing for all patients 59 and older May be repeated more frequently for patients with increased risk factors or exposure Gonorrhea, Chlamydia Every 12 months for all sexually active persons 13-24 years Additional monitoring may be recommended for those who are considered high risk or who have symptoms PSA Men 69-17 years old with risk factors Additional screening may be recommended from age 43-69 based on risk factors, symptoms, and history  Vaccine Recommendations Tetanus Booster All adults every 10 years Flu Vaccine All patients 6 months and older every year COVID Vaccine All patients 12 years and older Initial dosing with booster May recommend additional booster based on age and health history HPV Vaccine 2 doses all patients age 71-26 Dosing may be considered  for patients over 26 Shingles Vaccine (Shingrix) 2 doses all adults 50 years and older Pneumonia (Pneumovax 23) All adults 65 years and older May recommend earlier dosing based on health history Pneumonia (Prevnar 53) All adults 65 years and older Dosed 1 year after Pneumovax 23 Pneumonia (Prevnar 20) All adults 65 years and older (adults 19-64 with certain conditions or risk factors) 1 dose  For those who have not received Prevnar 13 vaccine previously   Additional Screening, Testing, and Vaccinations may be recommended on an individualized basis based on family history, health history, risk  factors, and/or exposure.  __________________________________________________________  Diet Recommendations for All Patients  I recommend that all patients maintain a diet low in saturated fats, carbohydrates, and cholesterol. While this can be challenging at first, it is not impossible and small changes can make big differences.  Things to try: Decreasing the amount of soda, sweet tea, and/or juice to one or less per day and replace with water While water is always the first choice, if you do not like water you may consider adding a water additive without sugar to improve the taste other sugar free drinks Replace potatoes with a brightly colored vegetable  Use healthy oils, such as canola oil or olive oil, instead of butter or hard margarine Limit your bread intake to two pieces or less a day Replace regular pasta with low carb pasta options Bake, broil, or grill foods instead of frying Monitor portion sizes  Eat smaller, more frequent meals throughout the day instead of large meals  An important thing to remember is, if you love foods that are not great for your health, you don't have to give them up completely. Instead, allow these foods to be a reward when you have done well. Allowing yourself to still have special treats every once in a while is a nice way to tell yourself thank you for working hard to keep yourself healthy.   Also remember that every day is a new day. If you have a bad day and "fall off the wagon", you can still climb right back up and keep moving along on your journey!  We have resources available to help you!  Some websites that may be helpful include: www.http://www.wall-moore.info/  Www.VeryWellFit.com _____________________________________________________________  Activity Recommendations for All Patients  I recommend that all adults get at least 30 minutes of moderate physical activity that elevates your heart rate at least 5 days out of the week.  Some examples  include: Walking or jogging at a pace that allows you to carry on a conversation Cycling (stationary bike or outdoors) Water aerobics Yoga Weight lifting Dancing If physical limitations prevent you from putting stress on your joints, exercise in a pool or seated in a chair are excellent options.  Do determine your MAXIMUM heart rate for activity: 220 - YOUR AGE = MAX Heart Rate   Remember! Do not push yourself too hard.  Start slowly and build up your pace, speed, weight, time in exercise, etc.  Allow your body to rest between exercise and get good sleep. You will need more water than normal when you are exerting yourself. Do not wait until you are thirsty to drink. Drink with a purpose of getting in at least 8, 8 ounce glasses of water a day plus more depending on how much you exercise and sweat.    If you begin to develop dizziness, chest pain, abdominal pain, jaw pain, shortness of breath, headache, vision changes, lightheadedness, or other concerning symptoms,  stop the activity and allow your body to rest. If your symptoms are severe, seek emergency evaluation immediately. If your symptoms are concerning, but not severe, please let us know so that we can recommend further evaluation.

## 2023-08-24 NOTE — Progress Notes (Signed)
 New Patient Office Visit  Subjective    Patient ID: Katie Cox, female    DOB: August 17, 1955  Age: 68 y.o. MRN: [id number]  CC:  Chief Complaint  Patient presents with   Establish Care    HPI Katie Cox presents to establish care. 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    Discussed the use of AI scribe software for clinical note transcription with the patient, who gave verbal consent to proceed.  History of Present Illness Katie Cox is a 68 year old female who presents to establish primary care.  She has a history of seasonal allergies, anxiety, reflux, high cholesterol, irritable bowel syndrome (IBS), peripheral neuropathy, vitamin D deficiency, and a history of postoperative nausea and vomiting. She also has a history of carpal tunnel surgery on the right, gallbladder surgery, and eye surgery at 68 years old. She had a rotator cuff repair on the right side in 2023 and corn removal from both feet. She also experienced a radial fracture from a fall.  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 She uses compression socks but finds them only somewhat effective.   Her current medications include rosuvastatin for cholesterol, losartan for kidney protection, bumetanide for fluid management, potassium, Pepcid for reflux, allopurinol for gout, and various vitamins including vitamin D, B12, magnesium, multivitamin, zinc, and vitamin C. She uses Flonase as needed for allergies and takes Buspar for anxiety. She is allergic to alprazolam and Lexapro.  She has a family history of high blood pressure and arrhythmia in her mother, colon cancer with polyps in her father, macular degeneration in her brother, COPD in her grandfather, and breast cancer in an aunt on her mother's side. She is proactive about colonoscopies due to her father's history, with her last colonoscopy in 2021 showing no issues, but a previous one revealed  seven polyps after a ten-year gap.  She denies alcohol, drug, tobacco, or nicotine use. She is postmenopausal, having stopped menstruating around age 23 without experiencing hot flashes. 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          Outpatient Encounter Medications as of 08/24/2023  Medication Sig   Acetaminophen (TYLENOL PO) Take by mouth.   allopurinol (ZYLOPRIM) 300 MG tablet TAKE 1 TABLET BY MOUTH DAILY FOR GOUT PREVENTION   Ascorbic Acid (VITAMIN C WITH ROSE HIPS) 1000 MG tablet Take 1,000 mg by mouth daily.   aspirin 81 MG tablet Take 81 mg by mouth every other day.    bumetanide (BUMEX) 2 MG tablet TAKE 1 TABLET BY MOUTH TWICE  DAILY FOR BLOOD PRESSURE AND  FLUID   busPIRone (BUSPAR) 5 MG tablet TAKE 1 TABLET BY MOUTH UP TO 3  TIMES DAILY AS NEEDED FOR  ANXIETY   Cholecalciferol (VITAMIN D3) 125 MCG (5000 UT) CAPS Take by mouth daily.   Cyanocobalamin (B-12 PO) Take by mouth daily.   famotidine (PEPCID) 40 MG tablet TAKE 1 TABLET BY MOUTH AT  BEDTIME TO PREVENT INDIGESTION  AND HEARTBURN   fluticasone (FLONASE) 50 MCG/ACT nasal spray Place 1 spray into both nostrils daily. (Patient taking differently: Place 1 spray into both nostrils daily as needed.)   losartan (COZAAR) 25 MG tablet TAKE 1 TABLET BY MOUTH  DAILY FOR KIDNEYS/PROTEIN.   magnesium gluconate (MAGONATE) 500 MG tablet Take 500 mg by mouth 2 (two) times daily.   Multiple Vitamin (MULTIVITAMIN) tablet Take  1 tablet by mouth daily.   OVER THE COUNTER MEDICATION Takes OTC Allegra for allergies.   potassium chloride SA (KLOR-CON M) 20 MEQ tablet TAKE 1 TABLET BY MOUTH TWICE  DAILY FOR POTASSIUM   rosuvastatin (CRESTOR) 20 MG tablet Take   1 tablet Daily for Cholesterol                                                          /                                                    TAKE                                          BY                                           MOUTH   Zinc 50 MG  CAPS Take by mouth daily.   No facility-administered encounter medications on file as of 08/24/2023.    Past Medical History:  Diagnosis Date   Abnormal glucose 02/05/2015   Allergy    seasonal   Anxiety    Contact lens/glasses fitting    wears contacts or glasses   Diverticulosis    GERD (gastroesophageal reflux disease)    Hyperlipidemia    Hyperuricemia 08/13/2014   Irritable bowel syndrome 10/18/2007   Irritable bowel syndrome (IBS)    Medication management 12/26/2013   Peripheral neuropathy    in feet   POLYP, COLON 10/18/2007   PONV (postoperative nausea and vomiting)    nausea, slow to wake up at times   Vitamin D deficiency     Past Surgical History:  Procedure Laterality Date   carpel tunnel surgery Right    CHOLECYSTECTOMY  04/14/2012   Procedure: LAPAROSCOPIC CHOLECYSTECTOMY;  Surgeon: Emelia Loron, MD;  Location: [facility];  Service: General;  Laterality: N/A;   COLONOSCOPY     EYE SURGERY     at age 62yr   FOOT SURGERY     corn removal both feet - little toes on each foot   FRACTURE SURGERY     goiter removed fro thyroid     OPEN REDUCTION INTERNAL FIXATION (ORIF) DISTAL RADIAL FRACTURE Right 07/04/2015   Procedure: OPEN REDUCTION INTERNAL FIXATION (ORIF) RIGHT DISTAL RADIUS FRACTURE;  Surgeon: Bradly Bienenstock, MD;  Location: [facility];  Service: Orthopedics;  Laterality: Right;   POLYPECTOMY     ROTATOR CUFF REPAIR Right 2023   WISDOM TOOTH EXTRACTION      Family History  Problem Relation Age of Onset   Hypertension Mother    Arrhythmia Mother    Colon cancer Father 56   Colon polyps Father    Cancer Father    Macular degeneration Brother    COPD Paternal Grandfather    Breast cancer Maternal Aunt    Esophageal cancer Neg Hx    Rectal cancer Neg Hx  Stomach cancer Neg Hx     Social History   Socioeconomic History   Marital status: Divorced    Spouse name: Not on file   Number of children: 1   Years of education: Not on  file   Highest education level: 12th grade  Occupational History   Occupation: accounts payable clerk  Tobacco Use   Smoking status: Never   Smokeless tobacco: Never  Vaping Use   Vaping status: Never Used  Substance and Sexual Activity   Alcohol use: No   Drug use: No   Sexual activity: Not Currently    Birth control/protection: Post-menopausal  Other Topics Concern   Not on file  Social History Narrative   Not on file   Social Drivers of Health   Financial Resource Strain: Low Risk  (08/23/2023)   Overall Financial Resource Strain (CARDIA)    Difficulty of Paying Living Expenses: Not hard at all  Food Insecurity: No Food Insecurity (08/23/2023)   Hunger Vital Sign    Worried About Running Out of Food in the Last Year: Never true    Ran Out of Food in the Last Year: Never true  Transportation Needs: No Transportation Needs (08/23/2023)   PRAPARE - Administrator, Civil Service (Medical): No    Lack of Transportation (Non-Medical): No  Physical Activity: Insufficiently Active (08/23/2023)   Exercise Vital Sign    Days of Exercise per Week: 2 days    Minutes of Exercise per Session: 30 min  Stress: No Stress Concern Present (08/23/2023)   [employer] of Occupational Health - Occupational Stress Questionnaire    Feeling of Stress : Only a little  Social Connections: Unknown (08/23/2023)   Social Connection and Isolation Panel [NHANES]    Frequency of Communication with Friends and Family: More than three times a week    Frequency of Social Gatherings with Friends and Family: Once a week    Attends Religious Services: Patient declined    Database administrator or Organizations: No    Attends Engineer, structural: Not on file    Marital Status: Divorced  Catering manager Violence: Not on file    ROS All review of systems negative except what is listed in the HPI      Objective    BP (!) 127/59   Pulse (!) 54   Ht 5' 6.5" (1.689 m)   Wt 159 lb  (72.1 kg)   SpO2 99%   BMI 25.28 kg/m   Physical Exam Vitals reviewed.  Constitutional:      Appearance: Normal appearance.  Cardiovascular:     Rate and Rhythm: Normal rate and regular rhythm.  Pulmonary:     Effort: Pulmonary effort is normal.     Breath sounds: Normal breath sounds.  Musculoskeletal:     Right lower leg: No edema.     Left lower leg: No edema.  Skin:    General: Skin is warm and dry.  Neurological:     Mental Status: She is alert and oriented to person, place, and time.  Psychiatric:        Mood and Affect: Mood normal.        Behavior: Behavior normal.        Thought Content: Thought content normal.        Judgment: Judgment normal.         Assessment & Plan:   Problem List Items Addressed This Visit       Active Problems  Hyperlipidemia, mixed (Chronic)   Well controlled on rosuvastatin 20mg  daily. Last cholesterol check in October 2024 was within normal limits. -Continue rosuvastatin 20mg  daily. -Repeat lipid panel today      Relevant Orders   Comprehensive metabolic panel   Lipid panel   Hyperuricemia (Chronic)   Well controlled with allopurinol and lifestyle measures       Anxiety (Chronic)   Chronic condition, managed with Buspar. No SI/HI. PHQ9/GAD7 reviewed.  -Continue Buspar.      Relevant Orders   TSH   CKD (chronic kidney disease) stage 3, GFR 30-59 ml/min (HCC) - Primary (Chronic)   On losartan for kidney protection.= and budesonide for fluid.  Following with [facility]  -Repeat renal function tests today.      Relevant Orders   CBC with Differential/Platelet   Comprehensive metabolic panel   TSH   Osteopenia (Chronic)   Encouraged lifestyle measures, calcium and vitamin D supplementation.  August 2024: The BMD measured at Femur Neck Right is 0.825 g/cm2 with a T-score of -1.5. This patient's diagnostic category is LOW BONE MASS/OSTEOPENIA according to World Health Organization [facility]) criteria.       Vitamin D deficiency   On cholecalciferol 5000 units daily. -Check Vitamin D level today.      Relevant Orders   VITAMIN D 25 Hydroxy (Vit-D Deficiency, Fractures)   FHx: heart disease   Mother with hx of arrhythmia. Patient is asymptomatic.       Other Visit Diagnoses       Encounter for medical examination to establish care           Return for physical after 03/23/24; schedule AWV .   Clayborne Dana, NP

## 2023-08-24 NOTE — Assessment & Plan Note (Signed)
 Chronic condition, managed with Buspar. No SI/HI. PHQ9/GAD7 reviewed.  -Continue Buspar.

## 2023-08-24 NOTE — Assessment & Plan Note (Signed)
 Encouraged lifestyle measures, calcium and vitamin D supplementation.  August 2024: The BMD measured at Femur Neck Right is 0.825 g/cm2 with a T-score of -1.5. This patient's diagnostic category is LOW BONE MASS/OSTEOPENIA according to World Health Organization [facility]) criteria.

## 2023-08-24 NOTE — Assessment & Plan Note (Signed)
 Well controlled with allopurinol and lifestyle measures

## 2023-08-24 NOTE — Assessment & Plan Note (Signed)
 On losartan for kidney protection.= and budesonide for fluid.  Following with [facility]  -Repeat renal function tests today.

## 2023-08-24 NOTE — Assessment & Plan Note (Signed)
 Mother with hx of arrhythmia. Patient is asymptomatic.

## 2023-08-25 ENCOUNTER — Encounter: Payer: Self-pay | Admitting: Family Medicine

## 2023-08-25 LAB — CBC WITH DIFFERENTIAL/PLATELET
Basophils Absolute: 0 10*3/uL (ref 0.0–0.1)
Basophils Relative: 0.3 % (ref 0.0–3.0)
Eosinophils Absolute: 0.1 10*3/uL (ref 0.0–0.7)
Eosinophils Relative: 1 % (ref 0.0–5.0)
HCT: 40.6 % (ref 36.0–46.0)
Hemoglobin: 13.7 g/dL (ref 12.0–15.0)
Lymphocytes Relative: 46.1 % — ABNORMAL HIGH (ref 12.0–46.0)
Lymphs Abs: 4.1 10*3/uL — ABNORMAL HIGH (ref 0.7–4.0)
MCHC: 33.7 g/dL (ref 30.0–36.0)
MCV: 98.4 fl (ref 78.0–100.0)
Monocytes Absolute: 0.8 10*3/uL (ref 0.1–1.0)
Monocytes Relative: 8.5 % (ref 3.0–12.0)
Neutro Abs: 3.9 10*3/uL (ref 1.4–7.7)
Neutrophils Relative %: 44.1 % (ref 43.0–77.0)
Platelets: 291 10*3/uL (ref 150.0–400.0)
RBC: 4.13 Mil/uL (ref 3.87–5.11)
RDW: 13.2 % (ref 11.5–15.5)
WBC: 8.9 10*3/uL (ref 4.0–10.5)

## 2023-08-25 LAB — COMPREHENSIVE METABOLIC PANEL WITH GFR
ALT: 21 U/L (ref 0–35)
AST: 31 U/L (ref 0–37)
Albumin: 4.6 g/dL (ref 3.5–5.2)
Alkaline Phosphatase: 78 U/L (ref 39–117)
BUN: 29 mg/dL — ABNORMAL HIGH (ref 6–23)
CO2: 30 meq/L (ref 19–32)
Calcium: 10.1 mg/dL (ref 8.4–10.5)
Chloride: 99 meq/L (ref 96–112)
Creatinine, Ser: 1.16 mg/dL (ref 0.40–1.20)
GFR: 48.35 mL/min — ABNORMAL LOW
Glucose, Bld: 78 mg/dL (ref 70–99)
Potassium: 3.9 meq/L (ref 3.5–5.1)
Sodium: 139 meq/L (ref 135–145)
Total Bilirubin: 1 mg/dL (ref 0.2–1.2)
Total Protein: 7 g/dL (ref 6.0–8.3)

## 2023-08-25 LAB — LIPID PANEL
Cholesterol: 139 mg/dL (ref 0–200)
HDL: 63.2 mg/dL (ref >39.00)
LDL Cholesterol: 50 mg/dL (ref 0–99)
NonHDL: 75.91
Total CHOL/HDL Ratio: 2
Triglycerides: 130 mg/dL (ref 0.0–149.0)
VLDL: 26 mg/dL (ref 0.0–40.0)

## 2023-08-25 LAB — VITAMIN D 25 HYDROXY (VIT D DEFICIENCY, FRACTURES): VITD: 99.94 ng/mL (ref 30.00–100.00)

## 2023-08-25 LAB — TSH: TSH: 2.01 u[IU]/mL (ref 0.35–5.50)

## 2023-08-26 IMAGING — MG MM DIGITAL SCREENING BILAT W/ TOMO AND CAD
8 series · 8 of 24 positions shown · non-contrast
Comparison: Previous exam(s).

CLINICAL DATA: Screening.

EXAM:
DIGITAL SCREENING BILATERAL MAMMOGRAM WITH TOMOSYNTHESIS AND CAD
TECHNIQUE: Bilateral screening digital craniocaudal and mediolateral oblique
mammograms were obtained. Bilateral screening digital breast
tomosynthesis was performed. The images were evaluated with
computer-aided detection.

[L MLO synth-2D]
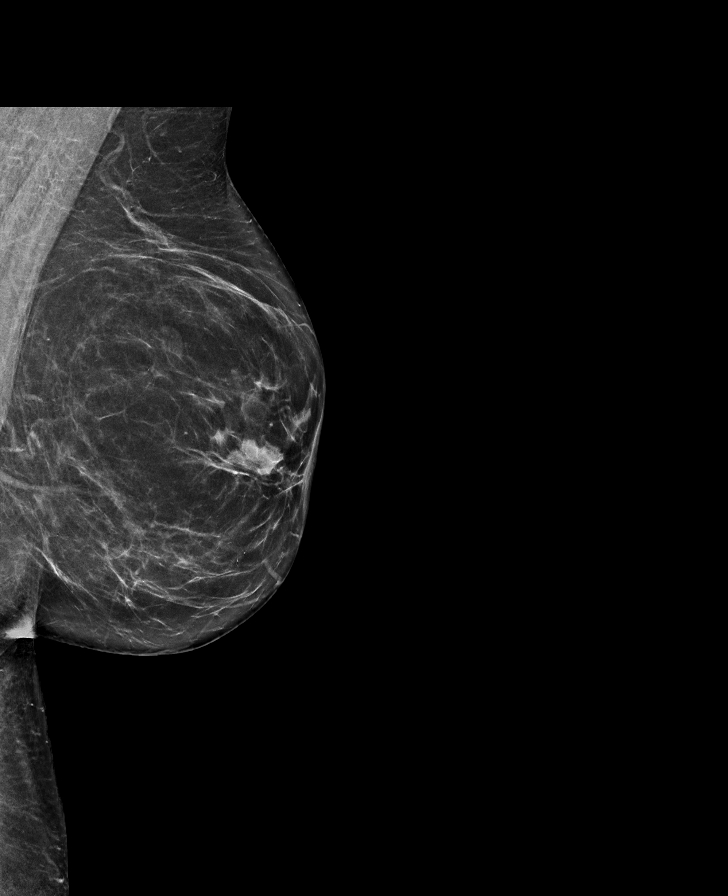

[R CC synth-2D]
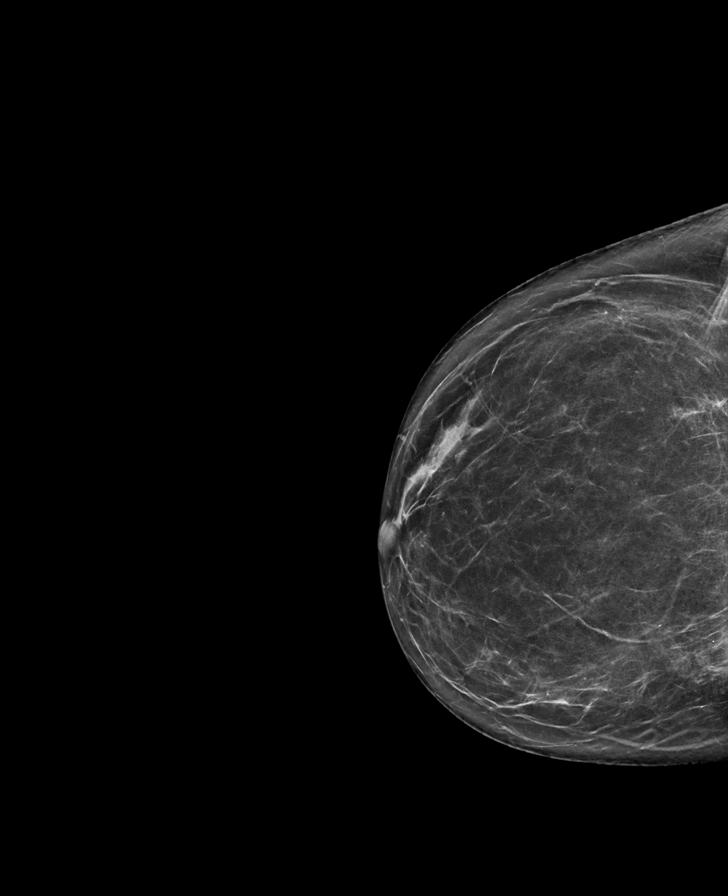

[R MLO synth-2D]
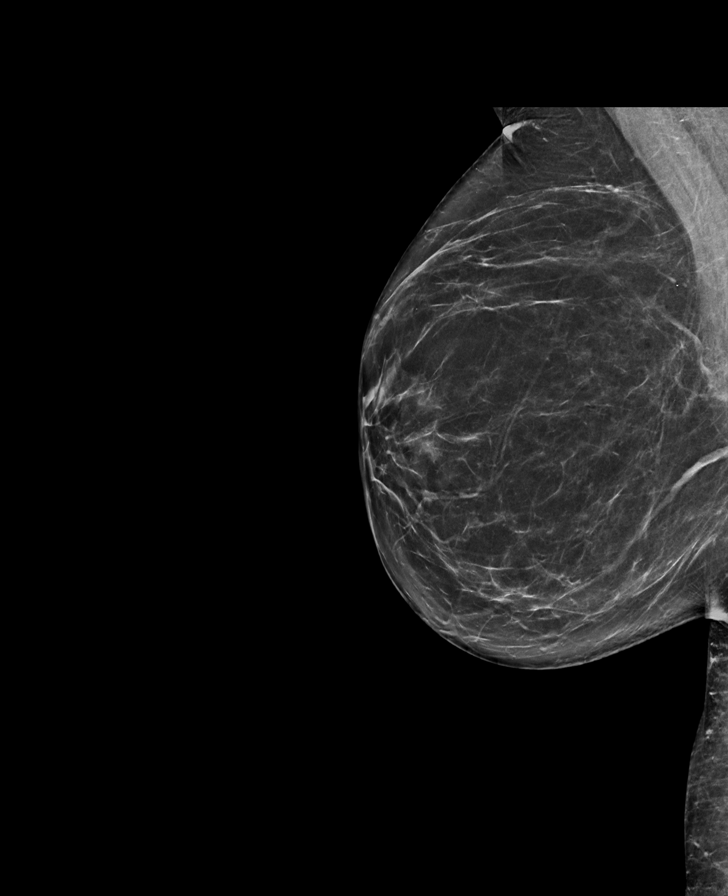

[L CC synth-2D]
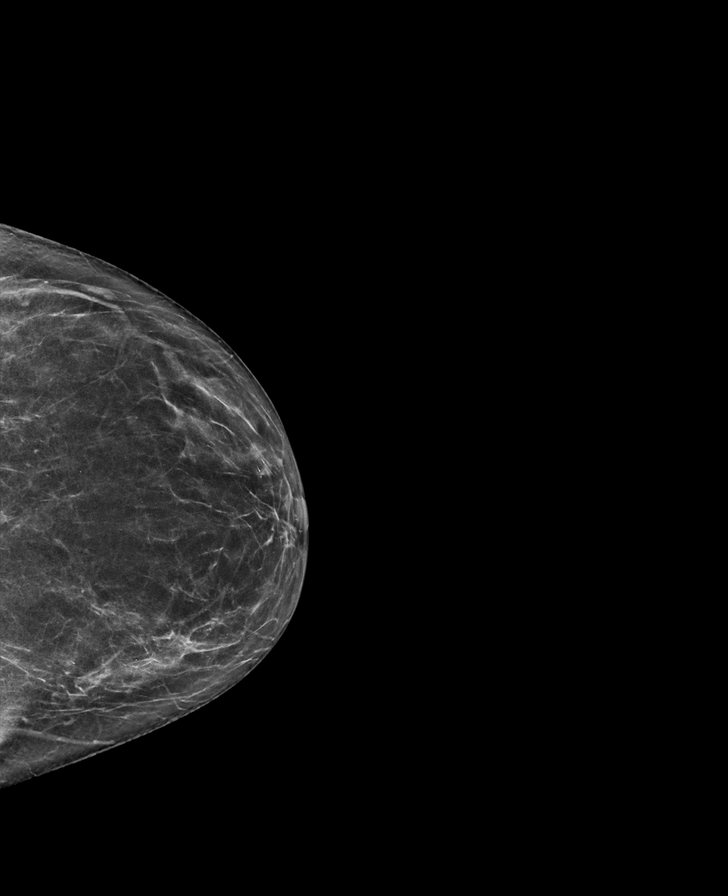

[L MLO tomo · tomo slice 35/69.0]
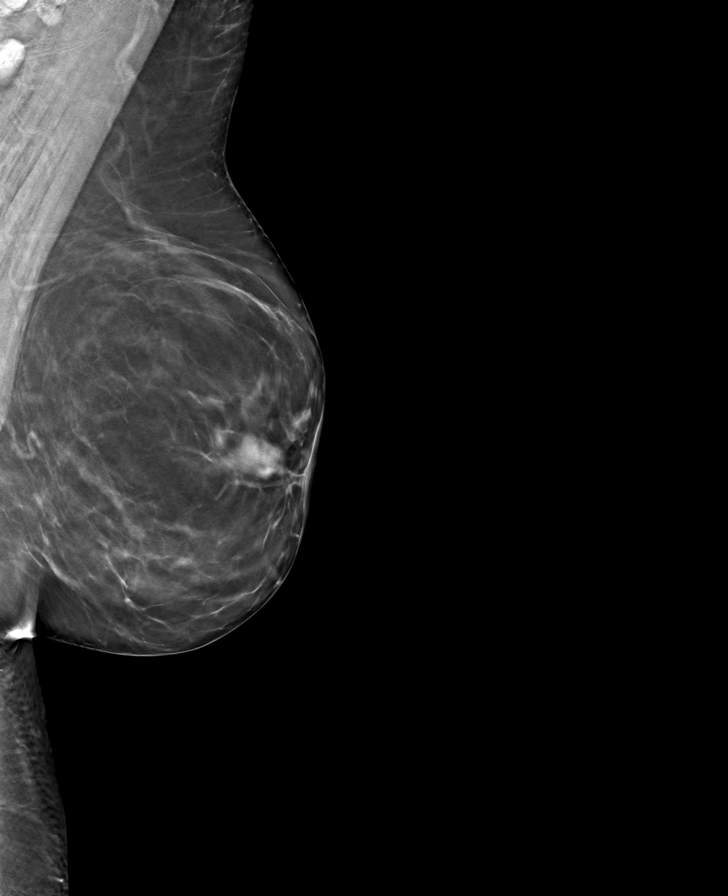

[L CC tomo · tomo slice 33/64.0]
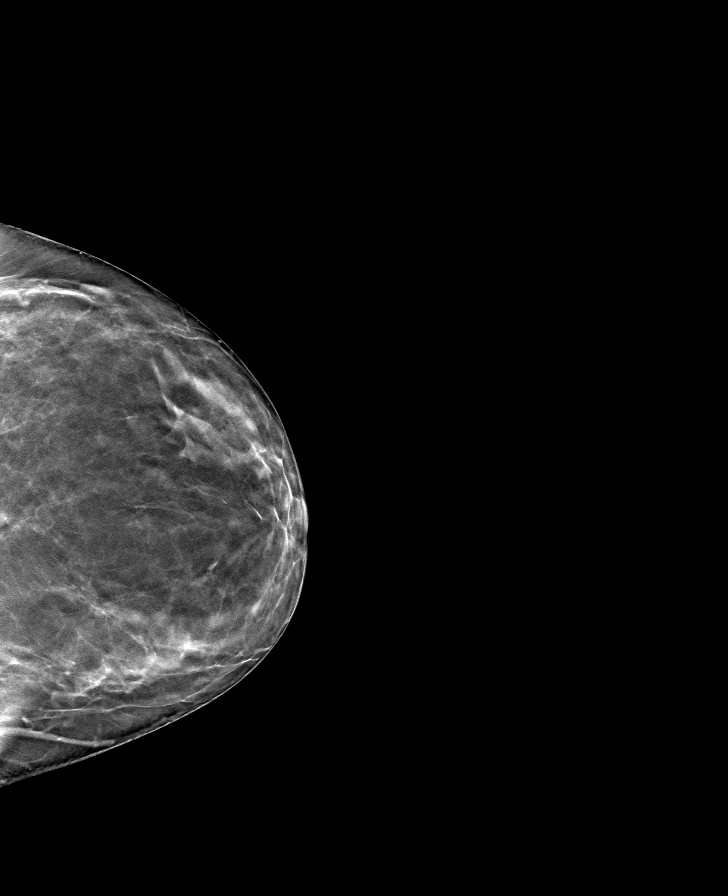

[R CC tomo · tomo slice 35/68.0]
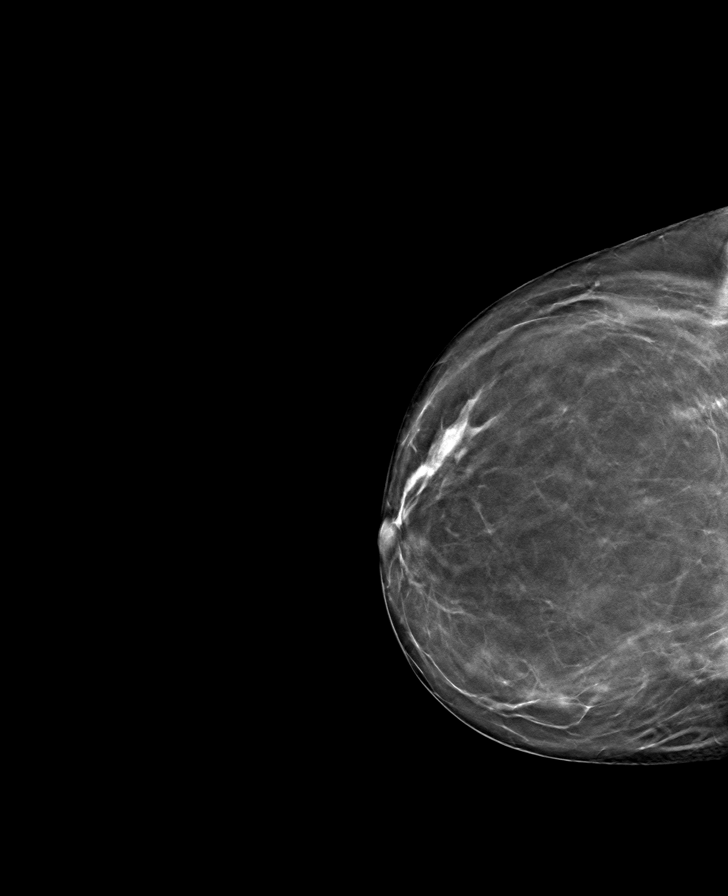

[R MLO tomo · tomo slice 37/72.0]
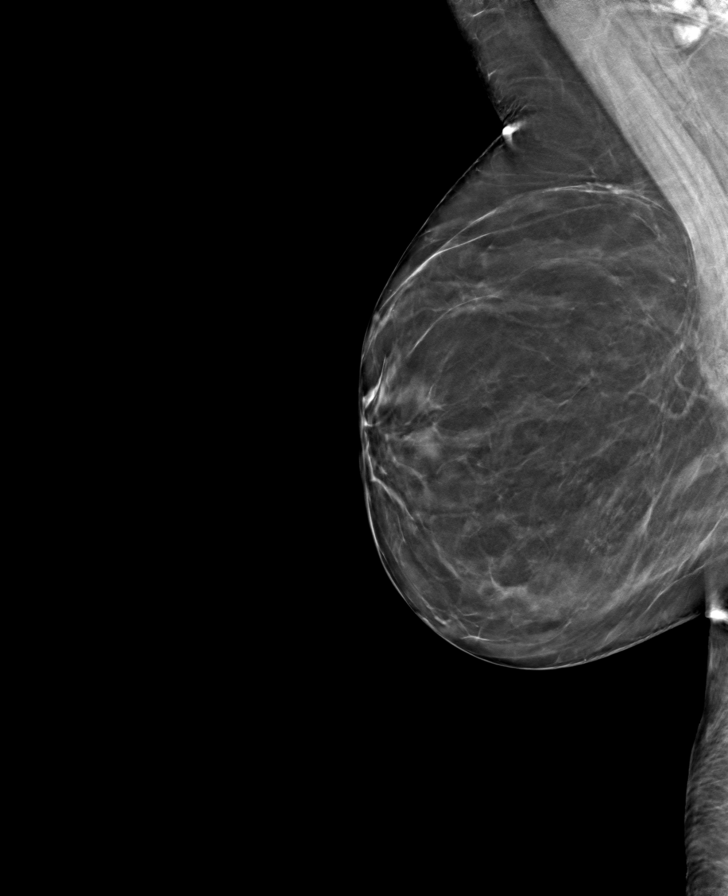

[8 of 24 positions shown; findings below may reference images not displayed]

ACR Breast Density Category b: There are scattered areas of
fibroglandular density.
FINDINGS: There are no findings suspicious for malignancy.
IMPRESSION: No mammographic evidence of malignancy. A result letter of this
screening mammogram will be mailed directly to the patient.

RECOMMENDATION:
Screening mammogram in one year. (Code:51-O-LD2)

BI-RADS CATEGORY  1: Negative.

## 2023-09-23 ENCOUNTER — Ambulatory Visit: Payer: Medicare Other | Admitting: Nurse Practitioner

## 2023-10-04 ENCOUNTER — Other Ambulatory Visit: Payer: Self-pay | Admitting: Family

## 2023-10-04 DIAGNOSIS — E782 Mixed hyperlipidemia: Secondary | ICD-10-CM

## 2023-10-22 ENCOUNTER — Telehealth: Payer: Self-pay | Admitting: Neurology

## 2023-10-22 NOTE — Telephone Encounter (Signed)
 Copied from CRM [id number]. Topic: Clinical - Prescription Issue >> Oct 22, 2023  8:31 AM Howard Macho wrote: Reason for CRM: patient called stating [facility] sent a medication request to the doctor and they have not received an answer for a refill yet. Patient stated they will cancel the medication if they do not hear back soon. Patient stated it has been two weeks now. Patient does not know the two medications that she need. The patient thought the nurse would know  [phone]

## 2023-10-22 NOTE — Telephone Encounter (Signed)
 Called patient and let her know that we have not received a request and she will need to find out which medications she needs and we would be happy to send if appropriate. She will call back.

## 2023-10-27 ENCOUNTER — Other Ambulatory Visit: Payer: Self-pay | Admitting: Neurology

## 2023-10-27 DIAGNOSIS — E782 Mixed hyperlipidemia: Secondary | ICD-10-CM

## 2023-10-27 MED ORDER — ROSUVASTATIN CALCIUM 20 MG PO TABS: 20.0000 mg | ORAL_TABLET | Freq: Every day | ORAL | 3 refills | Status: DC

## 2023-11-02 ENCOUNTER — Other Ambulatory Visit: Payer: Self-pay | Admitting: Family Medicine

## 2023-11-02 DIAGNOSIS — N1831 Chronic kidney disease, stage 3a: Secondary | ICD-10-CM

## 2023-11-02 MED ORDER — LOSARTAN POTASSIUM 25 MG PO TABS
25.0000 mg | ORAL_TABLET | Freq: Every day | ORAL | 0 refills | Status: DC
Start: 2023-11-02 — End: 2023-12-28

## 2023-11-02 NOTE — Telephone Encounter (Signed)
 Last Fill: 01/12/23  Last OV: 08/24/23 Next OV: 12/17/23 CPE  Routing to provider for review/authorization.

## 2023-11-02 NOTE — Telephone Encounter (Signed)
 Copied from CRM [id number]. Topic: Clinical - Medication Refill >> Nov 02, 2023 12:25 PM Katie Cox wrote: Medication: losartan  25 mg tablet  Has the patient contacted their pharmacy? Yes- [facility] refills; pt state [facility] has been trying to reach out to refill the medication  (Agent: If no, request that the patient contact the pharmacy for the refill. If patient does not wish to contact the pharmacy document the reason why and proceed with request.) (Agent: If yes, when and what did the pharmacy advise?)  This is the patient's preferred pharmacy:  [facility] - [place], [place] - [address] [facility] [address] [place] [address] [place] [address] Phone: [phone]( [phone]) Fax: [phone]   Is this the correct pharmacy for this prescription? Yes If no, delete pharmacy and type the correct one.   Has the prescription been filled recently? No  Is the patient out of the medication? Yes  Has the patient been seen for an appointment in the last year OR does the patient have an upcoming appointment? Yes  Can we respond through MyChart? Yes  Agent: Please be advised that Rx refills may take up to 3 business days. We ask that you follow-up with your pharmacy.

## 2023-12-02 ENCOUNTER — Other Ambulatory Visit: Payer: Self-pay

## 2023-12-02 DIAGNOSIS — I10 Essential (primary) hypertension: Secondary | ICD-10-CM

## 2023-12-02 NOTE — Telephone Encounter (Signed)
 Verified patient only seeking med refill on Potassium Chloride  ( Klor-con ).   Denies eval.     Copied from CRM [id number]. Topic: Clinical - Medication Refill >> Dec 02, 2023  3:57 PM Annelle Kiel wrote: Medication: and requested home health speech therapy evaluation next medicare week for Monday December 07 2023 [id number]  Has the patient contacted their pharmacy? Yes (Agent: If no, request that the patient contact the pharmacy for the refill. If patient does not wish to contact the pharmacy document the reason why and proceed with request.) (Agent: If yes, when and what did the pharmacy advise?)  This is the patient's preferred pharmacy:  [facility] - [place], [place] - [address] [facility] [address] [place] [address] [place] [address] Phone: [phone] Fax: [phone]    [facility] - [place], [place] - [address] [place]  [address] Phone: [phone] Fax: [phone]  Is this the correct pharmacy for this prescription? Yes If no, delete pharmacy and type the correct one.   Has the prescription been filled recently? no  Is the patient out of the medication? Yes  Has the patient been seen for an appointment in the last year OR does the patient have an upcoming appointment? Yes  Can we respond through MyChart? Yes  Agent: Please be advised that Rx refills may take up to 3 business days. We ask that you follow-up with your pharmacy.

## 2023-12-03 MED ORDER — POTASSIUM CHLORIDE CRYS ER 20 MEQ PO TBCR: EXTENDED_RELEASE_TABLET | ORAL | 2 refills | Status: DC

## 2023-12-15 ENCOUNTER — Encounter: Payer: Medicare Other | Admitting: Nurse Practitioner

## 2023-12-16 ENCOUNTER — Other Ambulatory Visit: Payer: Self-pay | Admitting: Family Medicine

## 2023-12-16 DIAGNOSIS — I10 Essential (primary) hypertension: Secondary | ICD-10-CM

## 2023-12-16 MED ORDER — POTASSIUM CHLORIDE CRYS ER 20 MEQ PO TBCR
20.0000 meq | EXTENDED_RELEASE_TABLET | Freq: Two times a day (BID) | ORAL | 0 refills | Status: DC
Start: 2023-12-16 — End: 2024-02-10

## 2023-12-16 NOTE — Telephone Encounter (Signed)
 Copied from CRM [id number]. Topic: Clinical - Medication Refill >> Dec 16, 2023 11:41 AM Rosina BIRCH wrote: Medication: potassium chloride  SA (KLOR-CON  M) 20 MEQ tablet (patient stated the pharmacy has reached out to her regarding her refill and she stated the pharmacy has reached out to the office as well but there has been no response)  Has the patient contacted their pharmacy? Yes (Agent: If no, request that the patient contact the pharmacy for the refill. If patient does not wish to contact the pharmacy document the reason why and proceed with request.) (Agent: If yes, when and what did the pharmacy advise?)  This is the patient's preferred pharmacy:  [facility] - [place], [place] - [address] [facility] [address] [place] [address] [place] [address] Phone: [phone] Fax: [phone]  Is this the correct pharmacy for this prescription? Yes If no, delete pharmacy and type the correct one.   Has the prescription been filled recently? Yes  Is the patient out of the medication? No  Has the patient been seen for an appointment in the last year OR does the patient have an upcoming appointment? Yes  Can we respond through MyChart? Yes  Agent: Please be advised that Rx refills may take up to 3 business days. We ask that you follow-up with your pharmacy.

## 2023-12-17 ENCOUNTER — Encounter: Admitting: Family Medicine

## 2023-12-24 ENCOUNTER — Encounter: Payer: Medicare Other | Admitting: Nurse Practitioner

## 2023-12-27 ENCOUNTER — Other Ambulatory Visit: Payer: Self-pay | Admitting: Family Medicine

## 2023-12-27 DIAGNOSIS — N1831 Chronic kidney disease, stage 3a: Secondary | ICD-10-CM

## 2023-12-31 ENCOUNTER — Encounter: Admitting: Family Medicine

## 2024-01-14 ENCOUNTER — Ambulatory Visit (INDEPENDENT_AMBULATORY_CARE_PROVIDER_SITE_OTHER): Admitting: Family Medicine

## 2024-01-14 ENCOUNTER — Encounter: Payer: Self-pay | Admitting: Family Medicine

## 2024-01-14 ENCOUNTER — Ambulatory Visit (HOSPITAL_BASED_OUTPATIENT_CLINIC_OR_DEPARTMENT_OTHER)
Admission: RE | Admit: 2024-01-14 | Discharge: 2024-01-14 | Disposition: A | Discharge status: Home / Self Care | Source: Ambulatory Visit | Attending: Family Medicine | Admitting: Family Medicine

## 2024-01-14 VITALS — BP 124/62 | HR 60 | Ht 66.5 in | Wt 160.0 lb

## 2024-01-14 DIAGNOSIS — G4719 Other hypersomnia: Secondary | ICD-10-CM

## 2024-01-14 DIAGNOSIS — E559 Vitamin D deficiency, unspecified: Secondary | ICD-10-CM | POA: Diagnosis not present

## 2024-01-14 DIAGNOSIS — R2689 Other abnormalities of gait and mobility: Secondary | ICD-10-CM

## 2024-01-14 DIAGNOSIS — Z0000 Encounter for general adult medical examination without abnormal findings: Secondary | ICD-10-CM

## 2024-01-14 DIAGNOSIS — G4452 New daily persistent headache (NDPH): Secondary | ICD-10-CM | POA: Diagnosis present

## 2024-01-14 DIAGNOSIS — R5383 Other fatigue: Secondary | ICD-10-CM

## 2024-01-14 NOTE — Progress Notes (Signed)
 Complete physical exam  Patient: Katie Cox   DOB: August 23, 1955   68 y.o. Female  MRN: [id number]  Subjective:    Chief Complaint  Patient presents with   Annual Exam    Katie Cox is a 68 y.o. female who presents today for a complete physical exam. She reports consuming a general diet. The patient does not participate in regular exercise at present. She generally feels fairly well. She reports sleeping fairly well. She does have additional problems to discuss today.    Discussed the use of AI scribe software for clinical note transcription with the patient, who gave verbal consent to proceed.  History of Present Illness Katie Cox is a 68 year old female who presents with daytime drowsiness and balance issues.  Daytime drowsiness has been present for about three months, particularly noticeable while driving in the mornings. Despite sleeping through the night, she wakes up feeling tired and experiences dry mouth upon waking. There is a family history of sleep apnea, with her brother and son both diagnosed with the condition.  Balance issues have been occurring for the past two to three weeks, characterized by episodes of feeling off balance and occasionally bumping into objects. These episodes are not associated with dizziness, vertigo, or vision changes. No weakness, confusion, or other neurological symptoms are reported.  She has a history of irritable bowel syndrome (IBS) and continues to experience stomach sensitivity, struggling to find foods that do not upset her stomach. She has been under the care of a gastroenterologist but has not had a recent follow-up. Additionally, she mentions swelling in her legs, which worsens with prolonged sitting and improves with movement. She attempts to limit salt intake but finds it challenging.  She has a history of neuropathy diagnosed after nerve studies due to gout and foot pain, although she does not experience typical neuropathy  symptoms. She occasionally wears compression socks but finds them uncomfortable.  Daily headaches have been occurring for the past month, located in the frontal region. The headaches are persistent but do not cause nausea. She attributes some of the headaches to stress and uses Tylenol  for relief, as advised due to her kidney condition.      Currently lives with: alone Acute concerns or interim problems since last visit: see above  Vision concerns: no concerns Dental concerns: no concerns STD concerns: no  ETOH use: no Nicotine use: no Recreational drugs/illegal substances: no      Most recent fall risk assessment:    01/14/2024    3:27 PM  Fall Risk   Falls in the past year? 1  Number falls in past yr: 0  Injury with Fall? 0  Risk for fall due to : History of fall(s)  Follow up Falls evaluation completed     Most recent depression screenings:    01/14/2024    3:28 PM 08/24/2023    3:48 PM  PHQ 2/9 Scores  PHQ - 2 Score 0 0  PHQ- 9 Score 7 2            Patient Care Team: Almarie Waddell NOVAK, NP as PCP - General (Family Medicine) Tonita Fallow, MD as Referring Physician (Internal Medicine)   Outpatient Medications Prior to Visit  Medication Sig   Acetaminophen  (TYLENOL  PO) Take by mouth.   allopurinol  (ZYLOPRIM ) 300 MG tablet TAKE 1 TABLET BY MOUTH DAILY FOR GOUT PREVENTION   Ascorbic Acid (VITAMIN C  WITH ROSE HIPS) 1000 MG tablet Take 1,000 mg by mouth  daily.   aspirin  81 MG tablet Take 81 mg by mouth every other day.    bumetanide  (BUMEX ) 2 MG tablet TAKE 1 TABLET BY MOUTH TWICE  DAILY FOR BLOOD PRESSURE AND  FLUID   busPIRone  (BUSPAR ) 5 MG tablet TAKE 1 TABLET BY MOUTH UP TO 3  TIMES DAILY AS NEEDED FOR  ANXIETY   Cholecalciferol (VITAMIN D3) 125 MCG (5000 UT) CAPS Take by mouth daily.   Cyanocobalamin (B-12 PO) Take by mouth daily.   famotidine  (PEPCID ) 40 MG tablet TAKE 1 TABLET BY MOUTH AT  BEDTIME TO PREVENT INDIGESTION  AND HEARTBURN   fluticasone   (FLONASE ) 50 MCG/ACT nasal spray Place 1 spray into both nostrils daily. (Patient taking differently: Place 1 spray into both nostrils daily as needed.)   losartan  (COZAAR ) 25 MG tablet TAKE 1 TABLET BY MOUTH DAILY   magnesium  gluconate (MAGONATE) 500 MG tablet Take 500 mg by mouth 2 (two) times daily.   Multiple Vitamin (MULTIVITAMIN) tablet Take 1 tablet by mouth daily.   OVER THE COUNTER MEDICATION Takes OTC Allegra for allergies.   potassium chloride  SA (KLOR-CON  M) 20 MEQ tablet Take 1 tablet (20 mEq total) by mouth 2 (two) times daily.   rosuvastatin  (CRESTOR ) 20 MG tablet Take 1 tablet (20 mg total) by mouth daily.   Zinc 50 MG CAPS Take by mouth daily.   No facility-administered medications prior to visit.    ROS All review of systems negative except what is listed in the HPI        Objective:     BP 124/62   Pulse 60   Ht 5' 6.5 (1.689 m)   Wt 160 lb (72.6 kg)   SpO2 99%   BMI 25.44 kg/m    Physical Exam Vitals reviewed.  Constitutional:      General: She is not in acute distress.    Appearance: Normal appearance. She is not ill-appearing.  HENT:     Head: Normocephalic and atraumatic.     Right Ear: Tympanic membrane normal.     Left Ear: Tympanic membrane normal.     Nose: Nose normal.     Mouth/Throat:     Mouth: Mucous membranes are moist.     Pharynx: Oropharynx is clear.  Eyes:     General: No visual field deficit.    Extraocular Movements: Extraocular movements intact.     Conjunctiva/sclera: Conjunctivae normal.     Pupils: Pupils are equal, round, and reactive to light.     Comments: Mild exotropia (left eye)  Cardiovascular:     Rate and Rhythm: Normal rate and regular rhythm.     Pulses: Normal pulses.     Heart sounds: Normal heart sounds.  Pulmonary:     Effort: Pulmonary effort is normal.     Breath sounds: Normal breath sounds.  Abdominal:     General: Abdomen is flat. Bowel sounds are normal. There is no distension.     Palpations:  Abdomen is soft. There is no mass.     Tenderness: There is no abdominal tenderness. There is no right CVA tenderness, left CVA tenderness, guarding or rebound.  Genitourinary:    Comments: Deferred exam Musculoskeletal:        General: Normal range of motion.     Cervical back: Normal range of motion and neck supple. No tenderness.     Right lower leg: No edema.     Left lower leg: No edema.  Lymphadenopathy:     Cervical: No cervical  adenopathy.  Skin:    General: Skin is warm and dry.     Capillary Refill: Capillary refill takes less than 2 seconds.  Neurological:     General: No focal deficit present.     Mental Status: She is alert and oriented to person, place, and time. Mental status is at baseline.     Cranial Nerves: No dysarthria or facial asymmetry.     Motor: No weakness, tremor or abnormal muscle tone.     Coordination: Coordination normal. Finger-Nose-Finger Test and Heel to Shin Test normal.  Psychiatric:        Mood and Affect: Mood normal.        Behavior: Behavior normal.        Thought Content: Thought content normal.        Judgment: Judgment normal.         No results found for any visits on 01/14/24.     Assessment & Plan:    Routine Health Maintenance and Physical Exam Discussed health promotion and safety including diet and exercise recommendations, dental health, and injury prevention. Tobacco cessation if applicable. Seat belts, sunscreen, smoke detectors, etc.    Immunization History  Administered Date(s) Administered   Influenza Inj Mdck Quad With Preservative 04/21/2018, 05/18/2019, 06/07/2020   Influenza, High Dose Seasonal PF 06/05/2021, 05/21/2022, 03/24/2023   PFIZER(Purple Top)SARS-COV-2 Vaccination 08/30/2019, 09/19/2019, 10/12/2019   PPD Test 01/25/2014, 02/05/2015, 02/27/2016, 04/21/2018, 05/18/2019   Pneumococcal Conjugate-13 08/13/2020   Pneumococcal Polysaccharide-23 11/23/2008, 12/11/2021   Td 11/29/2020   Tdap 11/29/2009    Unspecified SARS-COV-2 Vaccination 09/19/2019   Zoster Recombinant(Shingrix) 09/22/2020    Health Maintenance  Topic Date Due   COVID-19 Vaccine (5 - 2024-25 season) 08/20/2024 (Originally 02/22/2023)   INFLUENZA VACCINE  01/22/2024   Medicare Annual Wellness (AWV)  03/23/2024   MAMMOGRAM  04/21/2024   Colonoscopy  08/31/2024   DTaP/Tdap/Td (3 - Td or Tdap) 11/30/2030   Pneumococcal Vaccine: 50+ Years  Completed   DEXA SCAN  Completed   Hepatitis C Screening  Completed   Zoster Vaccines- Shingrix  Completed   Hepatitis B Vaccines  Aged Out   HPV VACCINES  Aged Out   Meningococcal B Vaccine  Aged Out        Problem List Items Addressed This Visit       Active Problems   Vitamin D  deficiency   Relevant Orders   VITAMIN D  25 Hydroxy (Vit-D Deficiency, Fractures)   Other Visit Diagnoses       Annual physical exam    -  Primary   Relevant Orders   CBC with Differential/Platelet   Comprehensive metabolic panel with GFR   Lipid panel   TSH     Excessive daytime sleepiness       Relevant Orders   Ambulatory referral to Pulmonology     Fatigue, unspecified type       Relevant Orders   CBC with Differential/Platelet   Comprehensive metabolic panel with GFR   TSH   B12 and Folate Panel   IBC + Ferritin     Balance problem       Relevant Orders   CT HEAD WO CONTRAST ( )   Ambulatory referral to Neurology     New persistent daily headache       Relevant Orders   CT HEAD WO CONTRAST ( )   Ambulatory referral to Neurology       Assessment & Plan Excessive daytime sleepiness. - Order referral to pulmonology for a sleep  study. - Order labs today  Balance issues Neurological causes need to be ruled out. - Order CT scan of the head. - Order referral to neurology for further evaluation.  Headaches New daily persistent headache, mostly frontal. Given new balance issues also need to start with head CT.  - Order CT scan of the head. Neuro referral placed.  -  Advise using Flonase  daily for 2-4 weeks for any potential sinus/allergy component.  Patient aware of signs/symptoms requiring further/urgent evaluation.          PATIENT COUNSELING:    Recommend that most people either abstain from alcohol or drink within safe limits (<=14/week and <=4 drinks/occasion for males, <=7/weeks and <= 3 drinks/occasion for females) and that the risk for alcohol disorders and other health effects rises proportionally with the number of drinks per week and how often a drinker exceeds daily limits.   Diet: Recommend to adjust caloric intake to maintain or achieve ideal body weight, to reduce intake of dietary saturated fat and total fat, to limit sodium intake by avoiding high sodium foods and not adding table salt, and to maintain adequate dietary potassium and calcium  preferably from fresh fruits, vegetables, and low-fat dairy products.   Emphasized the importance of regular exercise.  Injury prevention: Recommend seatbelts, safety helmets, smoke detector, etc..   Dental health: Recommend regular tooth brushing, flossing, and dental visits.      Return in about 6 months (around 07/16/2024) for chronic disease management, - pending results or sooner if needed.     Waddell KATHEE Mon, NP   Offered to make this just an acute visit and do CPE later, but she wanted to do everything at once today.

## 2024-01-15 ENCOUNTER — Encounter: Payer: Self-pay | Admitting: Neurology

## 2024-01-15 ENCOUNTER — Ambulatory Visit: Payer: Self-pay | Admitting: Family Medicine

## 2024-01-15 LAB — COMPREHENSIVE METABOLIC PANEL WITH GFR
ALT: 26 U/L (ref 0–35)
AST: 43 U/L — ABNORMAL HIGH (ref 0–37)
Albumin: 4.5 g/dL (ref 3.5–5.2)
Alkaline Phosphatase: 91 U/L (ref 39–117)
BUN: 33 mg/dL — ABNORMAL HIGH (ref 6–23)
CO2: 22 meq/L (ref 19–32)
Calcium: 9.7 mg/dL (ref 8.4–10.5)
Chloride: 103 meq/L (ref 96–112)
Creatinine, Ser: 1.29 mg/dL — ABNORMAL HIGH (ref 0.40–1.20)
GFR: 42.45 mL/min — ABNORMAL LOW (ref >60.00)
Glucose, Bld: 102 mg/dL — ABNORMAL HIGH (ref 70–99)
Potassium: 4.3 meq/L (ref 3.5–5.1)
Sodium: 142 meq/L (ref 135–145)
Total Bilirubin: 0.8 mg/dL (ref 0.2–1.2)
Total Protein: 6.9 g/dL (ref 6.0–8.3)

## 2024-01-15 LAB — CBC WITH DIFFERENTIAL/PLATELET
Basophils Absolute: 0.1 K/uL (ref 0.0–0.1)
Basophils Relative: 0.7 % (ref 0.0–3.0)
Eosinophils Absolute: 0.1 K/uL (ref 0.0–0.7)
Eosinophils Relative: 1.5 % (ref 0.0–5.0)
HCT: 41.5 % (ref 36.0–46.0)
Hemoglobin: 14 g/dL (ref 12.0–15.0)
Lymphocytes Relative: 34.5 % (ref 12.0–46.0)
Lymphs Abs: 2.7 K/uL (ref 0.7–4.0)
MCHC: 33.9 g/dL (ref 30.0–36.0)
MCV: 96.2 fl (ref 78.0–100.0)
Monocytes Absolute: 0.6 K/uL (ref 0.1–1.0)
Monocytes Relative: 7.7 % (ref 3.0–12.0)
Neutro Abs: 4.4 K/uL (ref 1.4–7.7)
Neutrophils Relative %: 55.6 % (ref 43.0–77.0)
Platelets: 273 K/uL (ref 150.0–400.0)
RBC: 4.32 Mil/uL (ref 3.87–5.11)
RDW: 13.2 % (ref 11.5–15.5)
WBC: 7.8 K/uL (ref 4.0–10.5)

## 2024-01-15 LAB — LIPID PANEL
Cholesterol: 143 mg/dL (ref 0–200)
HDL: 53.7 mg/dL (ref >39.00)
LDL Cholesterol: 45 mg/dL (ref 0–99)
NonHDL: 89.33
Total CHOL/HDL Ratio: 3
Triglycerides: 220 mg/dL — ABNORMAL HIGH (ref 0.0–149.0)
VLDL: 44 mg/dL — ABNORMAL HIGH (ref 0.0–40.0)

## 2024-01-15 LAB — VITAMIN D 25 HYDROXY (VIT D DEFICIENCY, FRACTURES): VITD: 89.65 ng/mL (ref 30.00–100.00)

## 2024-01-15 LAB — B12 AND FOLATE PANEL
Folate: >23.4 ng/mL (ref >5.9)
Vitamin B-12: >1500 pg/mL — ABNORMAL HIGH (ref 211–911)

## 2024-01-15 LAB — IBC + FERRITIN
Ferritin: 36.8 ng/mL (ref 10.0–291.0)
Iron: 124 ug/dL (ref 42–145)
Saturation Ratios: 31.4 % (ref 20.0–50.0)
TIBC: 394.8 ug/dL (ref 250.0–450.0)
Transferrin: 282 mg/dL (ref 212.0–360.0)

## 2024-01-15 LAB — TSH: TSH: 1.94 u[IU]/mL (ref 0.35–5.50)

## 2024-01-28 ENCOUNTER — Ambulatory Visit (HOSPITAL_BASED_OUTPATIENT_CLINIC_OR_DEPARTMENT_OTHER): Admitting: Pulmonary Disease

## 2024-02-08 ENCOUNTER — Encounter: Payer: Self-pay | Admitting: Primary Care

## 2024-02-08 ENCOUNTER — Ambulatory Visit (INDEPENDENT_AMBULATORY_CARE_PROVIDER_SITE_OTHER): Admitting: Primary Care

## 2024-02-08 VITALS — BP 122/60 | HR 54 | Temp 97.6°F | Ht 68.0 in | Wt 161.8 lb

## 2024-02-08 DIAGNOSIS — G4719 Other hypersomnia: Secondary | ICD-10-CM

## 2024-02-08 NOTE — Patient Instructions (Signed)
  VISIT SUMMARY: Today, we discussed your excessive daytime sleepiness and the possibility of sleep apnea. You mentioned feeling very sleepy during the day, especially in the mornings and while doing sedentary activities. We reviewed your sleep patterns, family history, and other symptoms like snoring, dry mouth, headaches, and dizziness.  YOUR PLAN: -EXCESSIVE DAYTIME SLEEPINESS AND SUSPECTED OBSTRUCTIVE SLEEP APNEA: Excessive daytime sleepiness means feeling very sleepy during the day, which can be a sign of sleep apnea--a condition where breathing repeatedly stops and starts during sleep. We will conduct a home sleep study to check for sleep apnea. Please send a message on MyChart once you complete the sleep study. We will have a virtual follow-up visit in six weeks to discuss the results. In the meantime, avoid driving if you feel excessively sleepy. If the sleep study does not show sleep apnea, we may consider further testing for narcolepsy.  INSTRUCTIONS: Please complete the home sleep study and send a message on MyChart once it is done. We will have a virtual follow-up visit in six weeks to discuss the results. Avoid driving if you experience excessive daytime sleepiness.

## 2024-02-08 NOTE — Progress Notes (Signed)
 @Patient  ID: Katie Cox, female    DOB: Apr 16, 1956, 68 y.o.   MRN: [id number]  Chief Complaint  Patient presents with   Consult    Difficulty sleeping off and on. Daytime sleepiness. Pt finds herself sleeping more    Referring provider: Almarie Waddell NOVAK, NP  HPI: 68 year old female, never smoked. PMH HTN, GERD, IBS, osteopenia, CKD, hyperlipidemia.  02/08/2024 Discussed the use of AI scribe software for clinical note transcription with the patient, who gave verbal consent to proceed.  History of Present Illness Katie Cox is a 68 year old female who presents with excessive daytime sleepiness and potential sleep apnea. She was referred by her primary care provider, Waddell, for evaluation of sleep issues.  She has been experiencing excessive daytime sleepiness over the past month, particularly in the mornings while driving and during sedentary activities such as reading or working. She feels sleepy and wants to close her eyes but manages to catch herself before doing so. This is a new development for her.  Her sleep pattern includes going to bed between 9 and 10 PM and waking up at 5 AM. Falling asleep is not immediate, and she often tosses and turns. She sometimes wakes up during the night, primarily to use the bathroom, but not consistently every night. She lives alone and is unsure if she experiences sleep apnea, though she has been told by family members that she snores and has occasionally woken herself up snoring. She also experiences dry mouth at night, which she attributes to sleeping with her mouth open.  She has been experiencing headaches over the last six months, which have become more frequent recently. A CT scan of her head in July was negative. She is also experiencing dizziness and balance issues, for which she is scheduled to see a neurologist in October.  Her family history includes a brother and a son who have been diagnosed with sleep apnea. She had a goiter removed  in the past but is not on thyroid  medication. A thyroid  level was checked in July and the result was normal.  She reports a weight gain of 30 pounds, although her BMI remains normal. She also retains a lot of fluid, which she believes contributes to her weight gain.   Allergies  Allergen Reactions   Alprazolam Other (See Comments)     Unknown reaction per pt   Lexapro  [Escitalopram ]     Diarrhea/nausea     Immunization History  Administered Date(s) Administered   Influenza Inj Mdck Quad With Preservative 04/21/2018, 05/18/2019, 06/07/2020   Influenza, High Dose Seasonal PF 06/05/2021, 05/21/2022, 03/24/2023   PFIZER(Purple Top)SARS-COV-2 Vaccination 08/30/2019, 09/19/2019, 10/12/2019   PPD Test 01/25/2014, 02/05/2015, 02/27/2016, 04/21/2018, 05/18/2019   Pneumococcal Conjugate-13 08/13/2020   Pneumococcal Polysaccharide-23 11/23/2008, 12/11/2021   Td 11/29/2020   Tdap 11/29/2009   Unspecified SARS-COV-2 Vaccination 09/19/2019   Zoster Recombinant(Shingrix) 09/22/2020    Past Medical History:  Diagnosis Date   Abnormal glucose 02/05/2015   Allergy    seasonal   Anxiety    Contact lens/glasses fitting    wears contacts or glasses   Diverticulosis    GERD (gastroesophageal reflux disease)    Hyperlipidemia    Hyperuricemia 08/13/2014   Irritable bowel syndrome 10/18/2007   Irritable bowel syndrome (IBS)    Medication management 12/26/2013   Peripheral neuropathy    in feet   POLYP, COLON 10/18/2007   PONV (postoperative nausea and vomiting)    nausea, slow to wake up at  times   Vitamin D  deficiency     Tobacco History: Social History   Tobacco Use  Smoking Status Never  Smokeless Tobacco Never   Counseling given: Not Answered   Outpatient Medications Prior to Visit  Medication Sig Dispense Refill   Acetaminophen  (TYLENOL  PO) Take by mouth.     allopurinol  (ZYLOPRIM ) 300 MG tablet TAKE 1 TABLET BY MOUTH DAILY FOR GOUT PREVENTION 100 tablet 2   Ascorbic Acid  (VITAMIN C  WITH ROSE HIPS) 1000 MG tablet Take 1,000 mg by mouth daily.     aspirin  81 MG tablet Take 81 mg by mouth every other day.      bumetanide  (BUMEX ) 2 MG tablet TAKE 1 TABLET BY MOUTH TWICE  DAILY FOR BLOOD PRESSURE AND  FLUID 200 tablet 2   busPIRone  (BUSPAR ) 5 MG tablet TAKE 1 TABLET BY MOUTH UP TO 3  TIMES DAILY AS NEEDED FOR  ANXIETY 240 tablet 1   Cholecalciferol (VITAMIN D3) 125 MCG (5000 UT) CAPS Take by mouth daily.     Cyanocobalamin  (B-12 PO) Take by mouth daily.     famotidine  (PEPCID ) 40 MG tablet TAKE 1 TABLET BY MOUTH AT  BEDTIME TO PREVENT INDIGESTION  AND HEARTBURN 100 tablet 2   fluticasone  (FLONASE ) 50 MCG/ACT nasal spray Place 1 spray into both nostrils daily. (Patient taking differently: Place 1 spray into both nostrils daily as needed.)     losartan  (COZAAR ) 25 MG tablet TAKE 1 TABLET BY MOUTH DAILY 90 tablet 0   magnesium  gluconate (MAGONATE) 500 MG tablet Take 500 mg by mouth 2 (two) times daily.     Multiple Vitamin (MULTIVITAMIN) tablet Take 1 tablet by mouth daily.     OVER THE COUNTER MEDICATION Takes OTC Allegra for allergies.     potassium chloride  SA (KLOR-CON  M) 20 MEQ tablet Take 1 tablet (20 mEq total) by mouth 2 (two) times daily. 180 tablet 0   rosuvastatin  (CRESTOR ) 20 MG tablet Take 1 tablet (20 mg total) by mouth daily. 90 tablet 3   Zinc 50 MG CAPS Take by mouth daily.     No facility-administered medications prior to visit.      Review of Systems  Review of Systems  Constitutional:  Positive for fatigue.  HENT: Negative.    Respiratory: Negative.    Cardiovascular: Negative.   Psychiatric/Behavioral:  Positive for sleep disturbance.      Physical Exam  BP 122/60   Pulse (!) 54   Temp 97.6 F (36.4 C)   Ht 5' 8 (1.727 m)   Wt 161 lb 12.8 oz (73.4 kg)   SpO2 99% Comment: RA  BMI 24.60 kg/m  Physical Exam Constitutional:      Appearance: Normal appearance.  HENT:     Head: Normocephalic and atraumatic.  Cardiovascular:      Rate and Rhythm: Normal rate and regular rhythm.  Pulmonary:     Effort: Pulmonary effort is normal.     Breath sounds: Normal breath sounds. No wheezing, rhonchi or rales.  Skin:    General: Skin is warm and dry.  Neurological:     General: No focal deficit present.     Mental Status: She is alert and oriented to person, place, and time. Mental status is at baseline.  Psychiatric:        Mood and Affect: Mood normal.        Behavior: Behavior normal.        Thought Content: Thought content normal.  Judgment: Judgment normal.      Lab Results:  CBC    Component Value Date/Time   WBC 7.8 01/14/2024 1524   RBC 4.32 01/14/2024 1524   HGB 14.0 01/14/2024 1524   HGB 12.3 07/21/2008 1509   HGB 13.1 10/10/2005 1541   HCT 41.5 01/14/2024 1524   HCT 35.0 07/21/2008 1509   HCT 36.9 10/10/2005 1541   PLT 273.0 01/14/2024 1524   PLT 391 07/21/2008 1509   PLT 410 (H) 10/10/2005 1541   MCV 96.2 01/14/2024 1524   MCV 90 07/21/2008 1509   MCV 92.7 10/10/2005 1541   MCH 32.6 03/24/2023 1149   MCHC 33.9 01/14/2024 1524   RDW 13.2 01/14/2024 1524   RDW 10.9 07/21/2008 1509   RDW 11.9 10/10/2005 1541   LYMPHSABS 2.7 01/14/2024 1524   LYMPHSABS 5.0 (H) 07/21/2008 1509   LYMPHSABS 5.4 (H) 10/10/2005 1541   MONOABS 0.6 01/14/2024 1524   MONOABS 0.9 10/10/2005 1541   EOSABS 0.1 01/14/2024 1524   EOSABS 0.3 07/21/2008 1509   BASOSABS 0.1 01/14/2024 1524   BASOSABS 0.1 07/21/2008 1509   BASOSABS 0.1 10/10/2005 1541    BMET    Component Value Date/Time   NA 142 01/14/2024 1524   K 4.3 01/14/2024 1524   CL 103 01/14/2024 1524   CO2 22 01/14/2024 1524   GLUCOSE 102 (H) 01/14/2024 1524   BUN 33 (H) 01/14/2024 1524   CREATININE 1.29 (H) 01/14/2024 1524   CREATININE 1.06 (H) 03/24/2023 1149   CALCIUM  9.7 01/14/2024 1524   GFRNONAA 60 (L) 07/04/2021 1348   GFRNONAA 56 (L) 11/29/2020 0000   GFRAA 65 11/29/2020 0000    BNP No results found for: BNP  ProBNP No results  found for: PROBNP  Imaging: CT HEAD WO CONTRAST ( ) Result Date: 01/14/2024 CLINICAL DATA:  Provided history: Headache, new onset (Age >= 51y) balance changes EXAM: CT HEAD WITHOUT CONTRAST TECHNIQUE: Contiguous axial images were obtained from the base of the skull through the vertex without intravenous contrast. RADIATION DOSE REDUCTION: This exam was performed according to the departmental dose-optimization program which includes automated exposure control, adjustment of the mA and/or kV according to patient size and/or use of iterative reconstruction technique. COMPARISON:  None available. FINDINGS: Brain: No intracranial hemorrhage, mass effect, or midline shift. No hydrocephalus. The basilar cisterns are patent. No evidence of territorial infarct or acute ischemia. No extra-axial or intracranial fluid collection. Vascular: No hyperdense vessel or unexpected calcification. Skull: No fracture or focal lesion. Sinuses/Orbits: Paranasal sinuses and mastoid air cells are clear. The visualized orbits are unremarkable. Other: None. IMPRESSION: Negative noncontrast head CT. Electronically Signed   By: Andrea Gasman M.D.   On: 01/14/2024 17:14     Assessment & Plan:   1. Excessive daytime sleepiness (Primary) - Home sleep test; Future  Assessment and Plan Assessment & Plan Excessive daytime sleepiness and suspected obstructive sleep apnea Excessive daytime sleepiness occurs during driving and at rest, such as reading or sitting. Sleep pattern includes difficulty falling asleep, occasional awakenings, and snoring, suggesting obstructive sleep apnea. Symptoms like dry mouth and headaches may be related to sleep apnea. Differential diagnosis includes narcolepsy, but sleep apnea is suspected due to symptoms and family history. Normal BMI reduces the likelihood of obesity-related sleep apnea. Reviewed risk of untreated sleep apnea and treatment options. - Order home sleep study to evaluate for  obstructive sleep apnea. - Instruct her to send a message on MyChart upon completion of the sleep study. - Schedule a  virtual follow-up visit in six weeks to discuss sleep study results. - Advise her to avoid driving if experiencing excessive daytime sleepiness. - Discuss potential further testing for narcolepsy if sleep study is negative.     Almarie LELON Ferrari, NP 02/08/2024

## 2024-02-09 ENCOUNTER — Other Ambulatory Visit: Payer: Self-pay | Admitting: Family Medicine

## 2024-02-09 DIAGNOSIS — I10 Essential (primary) hypertension: Secondary | ICD-10-CM

## 2024-02-11 ENCOUNTER — Ambulatory Visit (HOSPITAL_BASED_OUTPATIENT_CLINIC_OR_DEPARTMENT_OTHER): Admitting: Pulmonary Disease

## 2024-02-17 ENCOUNTER — Telehealth: Payer: Self-pay

## 2024-02-17 ENCOUNTER — Encounter

## 2024-02-17 DIAGNOSIS — G4719 Other hypersomnia: Secondary | ICD-10-CM

## 2024-02-17 NOTE — Telephone Encounter (Signed)
 Copied from CRM #[id number]. Topic: Clinical - Medical Advice >> Feb 17, 2024 11:16 AM Rilla B wrote: Patient received her home sleep study and was asked to completed 3 nights.  Monday night was rough, however, patient states she had a very good night last night and wants to know if she really needs to do it again tonight. Please call patient @ [phone]  Spoke w/ patient verbalized understanding if it was requested for 3 night best to do what was requested   -NFN

## 2024-03-02 DIAGNOSIS — G4733 Obstructive sleep apnea (adult) (pediatric): Secondary | ICD-10-CM | POA: Diagnosis not present

## 2024-03-13 ENCOUNTER — Ambulatory Visit: Payer: Self-pay | Admitting: Primary Care

## 2024-03-13 DIAGNOSIS — G4733 Obstructive sleep apnea (adult) (pediatric): Secondary | ICD-10-CM

## 2024-03-15 ENCOUNTER — Telehealth: Payer: Self-pay | Admitting: *Deleted

## 2024-03-15 ENCOUNTER — Telehealth: Payer: Self-pay | Admitting: Primary Care

## 2024-03-15 NOTE — Telephone Encounter (Signed)
 Copied from CRM #[id number]. Topic: Clinical - Lab/Test Results >> Mar 15, 2024  2:16 PM [name] RAMAN wrote: Reason for CRM: Patient returning [name] phone call. Contacted CAL Trigg, no answer. Advised patient a follow up call would be made Patient also inquired as to why she has to have a Virtual appointment for her October visit. Patient would like a follow up call back. Patient was thankful and verbalized understanding.

## 2024-03-15 NOTE — Telephone Encounter (Signed)
 Duplicate encounter

## 2024-03-15 NOTE — Telephone Encounter (Signed)
 Copied from CRM [id number]. Topic: Clinical - Lab/Test Results >> Mar 14, 2024  3:23 PM Katie Cox wrote: Reason for CRM: pt returning call to [facility] about home sleep study results. Pt does work and may not be available but leave a message and she will try to call back.  Called and spoke with patient, she was confused about what her 10/13 appointment was about, she was under the impression that she would get her CPAP machine at that time.  I explained that the provider would explain the sleep study results to her and give recommendations.  In her case that is CPAP.  I let her know that once insurance approves the CPAP machine, the DME company will call her to arrange getting the machine to her.  I moved up her appointment to 9/30 at 11:30 am given her severe sleep apnea.  She verbalized understanding.  She did prefer to come into the office.  Nothing further needed.

## 2024-03-16 ENCOUNTER — Ambulatory Visit: Admitting: Primary Care

## 2024-03-21 NOTE — Progress Notes (Signed)
 I called and spoke to pt. Pt informed of Beth's note and verbalized understanding. Pt agreed to starting CPAP therapy and this will be placed through [employer]. I am also cancelling tomorrow's appt and pt will need to see Beth in 6 weeks after starting compliance. Pt informed. NFN

## 2024-03-21 NOTE — Progress Notes (Signed)
 Initial neurology clinic note  Katie Cox MRN: [id number] DOB: 1956/02/20  Referring provider: Almarie Waddell NOVAK, NP  Primary care provider: Almarie Waddell NOVAK, NP  Reason for consult:  headaches and imbalance  Subjective:  This is Ms. Katie Cox, a 68 y.o. right-handed female with a medical history of HLD, gout, GERD, IBS, anxiety, vit D deficiency, and OSA who presents to neurology clinic with imbalance and headaches. The patient is alone.  Patient is most concerned about balance. Patient has difficulty with balance for the last year. She is not falling, but will lose balance and run into things. She has numbness and tingling in her feet. Her feet burn if she is very active. She was diagnosed with neuropathy about 10 years ago by [facility]. She does not remember where this was done. She denies significant neck or back pain.  She has occasional numbness and tingling in her hands, mostly when waking from sleep.  She denies significant dry eyes. She has dry mouth, particularly in the morning. She has recently been diagnosed with sleep apnea and will be starting CPAP soon. She has occasional joint pain.  She has never been on neuropathic pain medication.  She takes B12, zinc, and Omega-3.   Patient has headaches that started about 6 months ago. She has 2-3 headaches per week. The pain is located around her forehead. It is an achy sensation, about 5/10. They typically last all day. She denies photophobia, phonophobia, or vomiting. She may occasionally has nausea. She thinks she stays hydrated. They are random. They may come more when she is at the computer. She has not noticed neck or shoulder tightness. She will take tylenol  when headaches are bad, maybe 2-3 times per week. This is ease the pain but not completely get rid of the headache. She wonders if headaches are related to stress. She does endorse a good bit of stress.  Patient is on Buspar  as needed for anxiety (takes once  daily).  Smoker: No OCP/hormone use: No Caffiene use: 1 cup of decaf coffee daily EtOH use: No Restrictive diet: No Family history of neurologic disease including headaches: No  MEDICATIONS:  Outpatient Encounter Medications as of 03/30/2024  Medication Sig Note   Acetaminophen  (TYLENOL  PO) Take by mouth. (Patient taking differently: Take by mouth as needed.)    allopurinol  (ZYLOPRIM ) 300 MG tablet TAKE 1 TABLET BY MOUTH DAILY FOR GOUT PREVENTION    Ascorbic Acid (VITAMIN C  WITH ROSE HIPS) 1000 MG tablet Take 1,000 mg by mouth daily.    aspirin  81 MG tablet Take 81 mg by mouth every other day.  01/25/2014: Takes every other day   bumetanide  (BUMEX ) 2 MG tablet TAKE 1 TABLET BY MOUTH TWICE  DAILY FOR BLOOD PRESSURE AND  FLUID    busPIRone  (BUSPAR ) 5 MG tablet TAKE 1 TABLET BY MOUTH UP TO 3  TIMES DAILY AS NEEDED FOR  ANXIETY    Cholecalciferol (VITAMIN D3) 125 MCG (5000 UT) CAPS Take by mouth daily.    Cyanocobalamin  (B-12 PO) Take by mouth daily.    famotidine  (PEPCID ) 40 MG tablet TAKE 1 TABLET BY MOUTH AT  BEDTIME TO PREVENT INDIGESTION  AND HEARTBURN    fluticasone  (FLONASE ) 50 MCG/ACT nasal spray Place 1 spray into both nostrils daily. (Patient taking differently: Place 1 spray into both nostrils daily as needed.) 04/21/2018: OTC    magnesium  gluconate (MAGONATE) 500 MG tablet Take 500 mg by mouth 2 (two) times daily. (Patient taking differently: Take 500 mg  by mouth daily.)    Multiple Vitamin (MULTIVITAMIN) tablet Take 1 tablet by mouth daily.    omega-3 acid ethyl esters (LOVAZA) 1 g capsule Take 1 capsule by mouth daily.    OVER THE COUNTER MEDICATION Takes OTC Allegra for allergies.    potassium chloride  SA (KLOR-CON  M) 20 MEQ tablet TAKE 1 TABLET BY MOUTH TWICE  DAILY    rosuvastatin  (CRESTOR ) 20 MG tablet Take 1 tablet (20 mg total) by mouth daily.    Zinc 50 MG CAPS Take by mouth daily.    [DISCONTINUED] losartan  (COZAAR ) 25 MG tablet TAKE 1 TABLET BY MOUTH DAILY    No  facility-administered encounter medications on file as of 03/30/2024.    PAST MEDICAL HISTORY: Past Medical History:  Diagnosis Date   Abnormal glucose 02/05/2015   Allergy    seasonal   Anxiety    Contact lens/glasses fitting    wears contacts or glasses   Diverticulosis    GERD (gastroesophageal reflux disease)    Hyperlipidemia    Hyperuricemia 08/13/2014   Irritable bowel syndrome 10/18/2007   Irritable bowel syndrome (IBS)    Medication management 12/26/2013   OSA (obstructive sleep apnea)    Peripheral neuropathy    in feet   POLYP, COLON 10/18/2007   PONV (postoperative nausea and vomiting)    nausea, slow to wake up at times   Vitamin D  deficiency     PAST SURGICAL HISTORY: Past Surgical History:  Procedure Laterality Date   carpel tunnel surgery Right    CHOLECYSTECTOMY  04/14/2012   Procedure: LAPAROSCOPIC CHOLECYSTECTOMY;  Surgeon: Donnice Bury, MD;  Location: [facility];  Service: General;  Laterality: N/A;   COLONOSCOPY     EYE SURGERY     at age 65yr   FOOT SURGERY     corn removal both feet - little toes on each foot   FRACTURE SURGERY     goiter removed fro thyroid      OPEN REDUCTION INTERNAL FIXATION (ORIF) DISTAL RADIAL FRACTURE Right 07/04/2015   Procedure: OPEN REDUCTION INTERNAL FIXATION (ORIF) RIGHT DISTAL RADIUS FRACTURE;  Surgeon: Prentice Pagan, MD;  Location: [facility] OR;  Service: Orthopedics;  Laterality: Right;   POLYPECTOMY     ROTATOR CUFF REPAIR Right 2023   WISDOM TOOTH EXTRACTION      ALLERGIES: Allergies  Allergen Reactions   Alprazolam Other (See Comments)     Unknown reaction per pt   Lexapro  [Escitalopram ]     Diarrhea/nausea     FAMILY HISTORY: Family History  Problem Relation Age of Onset   Hypertension Mother    Arrhythmia Mother    Colon cancer Father 44   Colon polyps Father    Cancer Father    Macular degeneration Brother    COPD Paternal Grandfather    Breast cancer Maternal Aunt    Esophageal  cancer Neg Hx    Rectal cancer Neg Hx    Stomach cancer Neg Hx     SOCIAL HISTORY: Social History   Tobacco Use   Smoking status: Never   Smokeless tobacco: Never  Vaping Use   Vaping status: Never Used  Substance Use Topics   Alcohol use: No   Drug use: No   Social History   Social History Narrative   Are you right handed or left handed? Right   Are you currently employed ?    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 yes   Who lives with  you?    What type of home do you live in: 1 story or 2 story? one    Caffiene occas    Objective:  Vital Signs:  BP 119/60   Pulse (!) 52   Ht 5' 8 (1.727 m)   Wt 161 lb (73 kg)   SpO2 98%   BMI 24.48 kg/m   General: General appearance: Awake and alert. No distress. Cooperative with exam.  Skin: No obvious rash or jaundice. HEENT: Atraumatic. Anicteric. Tightness of trapezius and cervical paraspinal muscles with tenderness to palpation and reduced range of motion. Lungs: Non-labored breathing on room air  Heart: Regular Extremities: No edema. No obvious deformity.  Psych: Mildly flat affect  Neurological: Mental Status: Alert. Speech fluent. No pseudobulbar affect Cranial Nerves: CNII: No RAPD. Visual fields intact. CNIII, IV, VI: PERRL. No nystagmus. EOMI. CN V: Facial sensation intact bilaterally to fine touch.  CN VII: Facial muscles symmetric and strong. No ptosis at rest. CN VIII: Hears finger rub well bilaterally. CN IX: No hypophonia. CN X: Palate elevates symmetrically. CN XI: Full strength shoulder shrug bilaterally. CN XII: Tongue protrusion full and midline. No atrophy or fasciculations. No significant dysarthria Motor: Tone is normal.  Individual muscle group testing (MRC grade out of 5):  Movement     Neck flexion 5    Neck extension 5     Right Left   Shoulder abduction 5 5   Elbow flexion 5 5   Elbow extension 5 5   Finger abduction - FDI 5 5   Finger abduction  - ADM 5 5   Finger extension 5 5   Finger distal flexion - 2/3 5 5    Finger distal flexion - 4/5 5 5    Thumb flexion - FPL 5 5   Thumb abduction - APB 5 5    Hip flexion 5 5   Hip extension 5 5   Hip adduction 5 5   Hip abduction 5 5   Knee extension 5 5   Knee flexion 5 5   Dorsiflexion 5 5   Plantarflexion 5 5     Reflexes:  Right Left   Bicep 2+ 2+   Tricep 2+ 2+   BrRad 2+ 2+   Knee 2+ 2+   Ankle 1+ 1+    Pathological Reflexes: Babinski: mute response bilaterally Hoffman: absent bilaterally Troemner: absent bilaterally Sensation: Pinprick: Diminished to bilateral mid-calves Vibration: Absent in bilateral great toes and ankles, otherwise intact Proprioception: Intact in bilateral great toes Coordination: Intact finger-to- nose-finger bilaterally. Romberg negative. Gait: Able to rise from chair with arms crossed unassisted. Narrow based gait, mildly ataxia.  Labs and Imaging review: Internal labs: 01/14/24: Ferritin 36.8 B12: > 1500 Folate wnl Vit D wnl TSH wnl Lipid panel: tChol 143, LDL 45, TG 220 CMP significant for Cr 1.29, BUN 33, AST 43, ALT 26 CBC w/ diff unremarkable  Tissue transglutaminase, IgA (01/13/23): negative HbA1c (12/15/22): 5.4  Imaging/Procedures: CT head wo contrast (01/14/24): FINDINGS: Brain: No intracranial hemorrhage, mass effect, or midline shift. No hydrocephalus. The basilar cisterns are patent. No evidence of territorial infarct or acute ischemia. No extra-axial or intracranial fluid collection.   Vascular: No hyperdense vessel or unexpected calcification.   Skull: No fracture or focal lesion.   Sinuses/Orbits: Paranasal sinuses and mastoid air cells are clear. The visualized orbits are unremarkable.   Other: None.   IMPRESSION: Negative noncontrast head CT.  Assessment/Plan:  MILYN STAPLETON is a 68 y.o. female who presents for  evaluation of imbalance and headaches. She has a relevant medical history of HLD, gout,  GERD, IBS, anxiety, vit D deficiency, and OSA. Her neurological examination is pertinent for diminished sensation in bilateral lower extremities in a length dependent fashion and mild gait ataxia. She also has very tight and tender trapezius and paraspinal muscles despite not endorsing much neck pain. Available diagnostic data is significant for normal CT head, normal B12, and HbA1c of 5.4. Patient's numbness and tingling in the legs with diminished sensation on examination is consistent with a distal symmetric polyneuropathy. She has no known risk factors for PN. This is likely the cause of her imbalance though. I will check labs to look for treatable causes. Her headaches sound most consistent with tension headaches, likely from cervicalgia. She would like to try physical therapy for this. I will also start gabapentin which could help neuropathic pain and headaches.   PLAN: -Blood work: B1, IFE, copper, vit E, ANA -Start gabapentin 300 mg at bedtime  -Lidocaine  cream PRN -Alpha lipoic acid 600 mg once or twice daily -Physical therapy for cervicalgia -OTC recommendations for headaches given in AVS  -Return to clinic in about 3 months  The impression above as well as the plan as outlined below were extensively discussed with the patient who voiced understanding. All questions were answered to their satisfaction.  The patient was counseled on pertinent fall precautions per the printed material provided today, and as noted under the Patient Instructions section below.  When available, results of the above investigations and possible further recommendations will be communicated to the patient via telephone/MyChart. Patient to call office if not contacted after expected testing turnaround time.   Total time spent reviewing records, interview, history/exam, documentation, and coordination of care on day of encounter:  70 min   Thank you for allowing me to participate in patient's care.  If I can  answer any additional questions, I would be pleased to do so.  Venetia Potters, MD   CC: Almarie Waddell NOVAK, NP [address] [place] [address]  CC: Referring provider: Almarie Waddell NOVAK, NP [address] [place],  [place] [address]

## 2024-03-22 ENCOUNTER — Ambulatory Visit: Admitting: Primary Care

## 2024-03-23 ENCOUNTER — Ambulatory Visit: Payer: Medicare Other | Admitting: Nurse Practitioner

## 2024-03-23 ENCOUNTER — Other Ambulatory Visit: Payer: Self-pay | Admitting: Nurse Practitioner

## 2024-03-23 DIAGNOSIS — K219 Gastro-esophageal reflux disease without esophagitis: Secondary | ICD-10-CM

## 2024-03-23 DIAGNOSIS — E790 Hyperuricemia without signs of inflammatory arthritis and tophaceous disease: Secondary | ICD-10-CM

## 2024-03-28 ENCOUNTER — Encounter: Admitting: Family Medicine

## 2024-03-29 ENCOUNTER — Telehealth: Payer: Self-pay | Admitting: *Deleted

## 2024-03-29 ENCOUNTER — Ambulatory Visit: Payer: Medicare Other | Admitting: Nurse Practitioner

## 2024-03-29 ENCOUNTER — Ambulatory Visit: Admitting: *Deleted

## 2024-03-29 VITALS — BP 131/70 | HR 48 | Temp 97.6°F | Resp 16 | Ht 66.5 in | Wt 160.8 lb

## 2024-03-29 DIAGNOSIS — F419 Anxiety disorder, unspecified: Secondary | ICD-10-CM | POA: Diagnosis not present

## 2024-03-29 DIAGNOSIS — Z1231 Encounter for screening mammogram for malignant neoplasm of breast: Secondary | ICD-10-CM

## 2024-03-29 DIAGNOSIS — Z0000 Encounter for general adult medical examination without abnormal findings: Secondary | ICD-10-CM

## 2024-03-29 DIAGNOSIS — Z604 Social exclusion and rejection: Secondary | ICD-10-CM

## 2024-03-29 NOTE — Patient Instructions (Addendum)
 Ms. Drumheller , Thank you for taking time out of your busy schedule to complete your Annual Wellness Visit with me. I enjoyed our conversation and look forward to speaking with you again next year. I, as well as your care team,  appreciate your ongoing commitment to your health goals. Please review the following plan we discussed and let me know if I can assist you in the future. Your Game plan/ To Do List    Referrals: If you haven't heard from the office you've been referred to, please reach out to them at the phone provided.   Mammogram ([facility]) after 04/21/24:  [phone]  Follow up Visits: Next Medicare AWV with our clinical staff: 03/30/25 3pm, in person.  Next Office Visit with your provider: 07/18/24 8:20am, Waddell Mon, NP.  Clinician Recommendations:  Aim for 30 minutes of exercise or brisk walking, 6-8 glasses of water, and 5 servings of fruits and vegetables each day.       This is a list of the screening recommended for you and due dates:  Health Maintenance  Topic Date Due   COVID-19 Vaccine (5 - 2025-26 season) 02/22/2024   Medicare Annual Wellness Visit  03/23/2024   Breast Cancer Screening  04/21/2024   Flu Shot  09/20/2024*   Colon Cancer Screening  08/31/2024   DTaP/Tdap/Td vaccine (3 - Td or Tdap) 11/30/2030   Pneumococcal Vaccine for age over 30  Completed   DEXA scan (bone density measurement)  Completed   Hepatitis C Screening  Completed   Zoster (Shingles) Vaccine  Completed   Meningitis B Vaccine  Aged Out  *Topic was postponed. The date shown is not the original due date.    Advanced directives: (Copy Requested) Please bring a copy of your health care power of attorney and living will to the office to be added to your chart at your convenience. You can mail to [facility] [address] [place], [place] [address] or email to [email or web address] Advance Care Planning is important because it:  [x]  Makes sure you receive the  medical care that is consistent with your values, goals, and preferences  [x]  It provides guidance to your family and loved ones and reduces their decisional burden about whether or not they are making the right decisions based on your wishes.  Follow the link provided in your after visit summary or read over the paperwork we have mailed to you to help you started getting your Advance Directives in place. If you need assistance in completing these, please reach out to us  so that we can help you!  See attachments for Preventive Care and Fall Prevention Tips.

## 2024-03-29 NOTE — Progress Notes (Signed)
 Please attest this visit in the absence of patient primary care provider.    Subjective:   Katie Cox is a 68 y.o. who presents for a Medicare Wellness preventive visit.  As a reminder, Annual Wellness Visits don't include a physical exam, and some assessments may be limited, especially if this visit is performed virtually. We may recommend an in-person follow-up visit with your provider if needed.  Visit Complete: In person   Persons Participating in Visit: Patient.  AWV Questionnaire: Yes: Patient Medicare AWV questionnaire was completed by the patient on 03/28/24; I have confirmed that all information answered by patient is correct and no changes since this date.  Cardiac Risk Factors include: advanced age (>83men, >100 women);hypertension;dyslipidemia;Other (see comment), Risk factor comments: CKD     Objective:    Today's Vitals   03/29/24 1504  BP: 131/70  Pulse: (!) 48  Resp: 16  Temp: 97.6 F (36.4 C)  TempSrc: Oral  SpO2: 100%  Weight: 160 lb 12.8 oz (72.9 kg)  Height: 5' 6.5 (1.689 m)   Body mass index is 25.56 kg/m.     03/29/2024    3:29 PM 03/24/2023   11:34 AM 06/20/2022   11:12 AM 07/04/2021    1:37 PM 06/05/2021   10:17 AM 06/07/2020    3:40 PM 07/21/2016    4:17 PM  Advanced Directives  Does Patient Have a Medical Advance Directive? Yes No No No No No No   Type of Advance Directive Healthcare Power of Attorney        Does patient want to make changes to medical advance directive? No - Patient declined        Copy of Healthcare Power of Attorney in Chart? No - copy requested        Would patient like information on creating a medical advance directive?  No - Patient declined No - Patient declined No - Patient declined Yes (MAU/Ambulatory/Procedural Areas - Information given) Yes (MAU/Ambulatory/Procedural Areas - Information given)      Data saved with a previous flowsheet row definition    Current Medications (verified) Outpatient Encounter  Medications as of 03/29/2024  Medication Sig   Acetaminophen  (TYLENOL  PO) Take by mouth.   allopurinol  (ZYLOPRIM ) 300 MG tablet TAKE 1 TABLET BY MOUTH DAILY FOR GOUT PREVENTION   Ascorbic Acid (VITAMIN C  WITH ROSE HIPS) 1000 MG tablet Take 1,000 mg by mouth daily.   aspirin  81 MG tablet Take 81 mg by mouth every other day.    bumetanide  (BUMEX ) 2 MG tablet TAKE 1 TABLET BY MOUTH TWICE  DAILY FOR BLOOD PRESSURE AND  FLUID (Patient taking differently: Take 2 mg by mouth daily.)   busPIRone  (BUSPAR ) 5 MG tablet TAKE 1 TABLET BY MOUTH UP TO 3  TIMES DAILY AS NEEDED FOR  ANXIETY   Cholecalciferol (VITAMIN D3) 125 MCG (5000 UT) CAPS Take by mouth daily.   Cyanocobalamin  (B-12 PO) Take by mouth daily.   famotidine  (PEPCID ) 40 MG tablet TAKE 1 TABLET BY MOUTH AT  BEDTIME TO PREVENT INDIGESTION  AND HEARTBURN   fluticasone  (FLONASE ) 50 MCG/ACT nasal spray Place 1 spray into both nostrils daily. (Patient taking differently: Place 1 spray into both nostrils daily as needed.)   magnesium  gluconate (MAGONATE) 500 MG tablet Take 500 mg by mouth 2 (two) times daily. (Patient taking differently: Take 500 mg by mouth daily.)   Multiple Vitamin (MULTIVITAMIN) tablet Take 1 tablet by mouth daily.   omega-3 acid ethyl esters (LOVAZA) 1 g capsule  Take 1 capsule by mouth daily.   OVER THE COUNTER MEDICATION Takes OTC Allegra for allergies.   potassium chloride  SA (KLOR-CON  M) 20 MEQ tablet TAKE 1 TABLET BY MOUTH TWICE  DAILY   rosuvastatin  (CRESTOR ) 20 MG tablet Take 1 tablet (20 mg total) by mouth daily.   Zinc 50 MG CAPS Take by mouth daily.   [DISCONTINUED] losartan  (COZAAR ) 25 MG tablet TAKE 1 TABLET BY MOUTH DAILY   No facility-administered encounter medications on file as of 03/29/2024.    Allergies (verified) Alprazolam and Lexapro  [escitalopram ]   History: Past Medical History:  Diagnosis Date   Abnormal glucose 02/05/2015   Allergy    seasonal   Anxiety    Contact lens/glasses fitting    wears  contacts or glasses   Diverticulosis    GERD (gastroesophageal reflux disease)    Hyperlipidemia    Hyperuricemia 08/13/2014   Irritable bowel syndrome 10/18/2007   Irritable bowel syndrome (IBS)    Medication management 12/26/2013   OSA (obstructive sleep apnea)    Peripheral neuropathy    in feet   POLYP, COLON 10/18/2007   PONV (postoperative nausea and vomiting)    nausea, slow to wake up at times   Vitamin D  deficiency    Past Surgical History:  Procedure Laterality Date   carpel tunnel surgery Right    CHOLECYSTECTOMY  04/14/2012   Procedure: LAPAROSCOPIC CHOLECYSTECTOMY;  Surgeon: Donnice Bury, MD;  Location: [facility];  Service: General;  Laterality: N/A;   COLONOSCOPY     EYE SURGERY     at age 15yr   FOOT SURGERY     corn removal both feet - little toes on each foot   FRACTURE SURGERY     goiter removed fro thyroid      OPEN REDUCTION INTERNAL FIXATION (ORIF) DISTAL RADIAL FRACTURE Right 07/04/2015   Procedure: OPEN REDUCTION INTERNAL FIXATION (ORIF) RIGHT DISTAL RADIUS FRACTURE;  Surgeon: Prentice Pagan, MD;  Location: [facility] OR;  Service: Orthopedics;  Laterality: Right;   POLYPECTOMY     ROTATOR CUFF REPAIR Right 2023   WISDOM TOOTH EXTRACTION     Family History  Problem Relation Age of Onset   Hypertension Mother    Arrhythmia Mother    Colon cancer Father 42   Colon polyps Father    Cancer Father    Macular degeneration Brother    COPD Paternal Grandfather    Breast cancer Maternal Aunt    Esophageal cancer Neg Hx    Rectal cancer Neg Hx    Stomach cancer Neg Hx    Social History   Socioeconomic History   Marital status: Divorced    Spouse name: Not on file   Number of children: 1   Years of education: Not on file   Highest education level: 12th grade  Occupational History   Occupation: accounts payable clerk  Tobacco Use   Smoking status: Never   Smokeless tobacco: Never  Vaping Use   Vaping status: Never Used  Substance and  Sexual Activity   Alcohol use: No   Drug use: No   Sexual activity: Not Currently    Birth control/protection: Post-menopausal  Other Topics Concern   Not on file  Social History Narrative   Not on file   Social Drivers of Health   Financial Resource Strain: Low Risk  (03/29/2024)   Overall Financial Resource Strain (CARDIA)    Difficulty of Paying Living Expenses: Not very hard  Food Insecurity: No Food Insecurity (03/29/2024)  Hunger Vital Sign    Worried About Running Out of Food in the Last Year: Never true    Ran Out of Food in the Last Year: Never true  Transportation Needs: No Transportation Needs (03/29/2024)   PRAPARE - Administrator, Civil Service (Medical): No    Lack of Transportation (Non-Medical): No  Physical Activity: Sufficiently Active (03/29/2024)   Exercise Vital Sign    Days of Exercise per Week: 5 days    Minutes of Exercise per Session: 60 min  Recent Concern: Physical Activity - Insufficiently Active (01/13/2024)   Exercise Vital Sign    Days of Exercise per Week: 2 days    Minutes of Exercise per Session: 30 min  Stress: Stress Concern Present (03/29/2024)   [employer] of Occupational Health - Occupational Stress Questionnaire    Feeling of Stress: To some extent  Social Connections: Moderately Isolated (03/29/2024)   Social Connection and Isolation Panel    Frequency of Communication with Friends and Family: Twice a week    Frequency of Social Gatherings with Friends and Family: Once a week    Attends Religious Services: More than 4 times per year    Active Member of [employer] or Organizations: No    Attends Engineer, structural: Never    Marital Status: Divorced    Tobacco Counseling Counseling given: Not Answered    Clinical Intake:  Pre-visit preparation completed: Yes  Pain : No/denies pain     BMI - recorded: 24.45 Nutritional Status: BMI of 19-24  Normal Nutritional Risks: None Diabetes: No  Lab Results   Component Value Date   HGBA1C 5.4 12/15/2022   HGBA1C 5.2 12/11/2021   HGBA1C 5.3 09/09/2021     How often do you need to have someone help you when you read instructions, pamphlets, or other written materials from your doctor or pharmacy?: 1 - Never What is the last grade level you completed in school?: 12  Interpreter Needed?: No  Information entered by :: Tericka Devincenzi, CMA(AAMA)   Activities of Daily Living     03/28/2024    7:16 PM  In your present state of health, do you have any difficulty performing the following activities:  Hearing? 1  Comment has hearing aids  Vision? 0  Difficulty concentrating or making decisions? 0  Walking or climbing stairs? 0  Dressing or bathing? 0  Doing errands, shopping? 0  Preparing Food and eating ? N  Using the Toilet? N  In the past six months, have you accidently leaked urine? N  Do you have problems with loss of bowel control? N  Managing your Medications? N  Managing your Finances? N  Housekeeping or managing your Housekeeping? N    Patient Care Team: Almarie Waddell NOVAK, NP as PCP - General (Family Medicine) Tonita Fallow, MD (Inactive) as Referring Physician (Internal Medicine)  I have updated your Care Teams any recent Medical Services you may have received from other providers in the past year.     Assessment:   This is a routine wellness examination for Carely.  Hearing/Vision screen Hearing Screening - Comments:: Has hearing aids Vision Screening - Comments:: Up to date with routine eye exams with [facility]  on [address]   Goals Addressed   None    Depression Screen     03/29/2024    3:27 PM 01/14/2024    3:28 PM 08/24/2023    3:48 PM 03/24/2023   11:35 AM 06/20/2022   11:12 AM  12/11/2021    5:11 PM 09/09/2021   10:07 PM  PHQ 2/9 Scores  PHQ - 2 Score 0 0 0 0 0 0 0  PHQ- 9 Score 3 7 2         Fall Risk     03/28/2024    7:16 PM 01/14/2024    3:27 PM 08/24/2023    3:48 PM 03/24/2023   11:34 AM  06/20/2022   11:12 AM  Fall Risk   Falls in the past year? 0 1 0 0 0  Number falls in past yr: 0 0 0 0 0  Injury with Fall? 0 0 0 0 0  Risk for fall due to :  History of fall(s) No Fall Risks No Fall Risks No Fall Risks  Follow up Education provided Falls evaluation completed Falls evaluation completed Falls prevention discussed;Falls evaluation completed Falls prevention discussed;Falls evaluation completed      Data saved with a previous flowsheet row definition    MEDICARE RISK AT HOME:  Medicare Risk at Home Any stairs in or around the home?: (Patient-Rptd) No If so, are there any without handrails?: (Patient-Rptd) No Home free of loose throw rugs in walkways, pet beds, electrical cords, etc?: (Patient-Rptd) Yes Adequate lighting in your home to reduce risk of falls?: (Patient-Rptd) Yes Life alert?: (Patient-Rptd) Yes Use of a cane, walker or w/c?: (Patient-Rptd) No Grab bars in the bathroom?: (Patient-Rptd) No Shower chair or bench in shower?: (Patient-Rptd) No Elevated toilet seat or a handicapped toilet?: (Patient-Rptd) No  TIMED UP AND GO:  Was the test performed?  Yes  Length of time to ambulate 10 feet: 6 sec Gait steady and fast without use of assistive device  Cognitive Function: 6CIT completed    12/15/2022    3:16 PM 06/07/2020    2:46 PM  MMSE - Mini Mental State Exam  Orientation to time 5 5  Orientation to Place 5 5  Registration 3 3  Attention/ Calculation 5 5  Recall 2 3  Language- name 2 objects 2 2  Language- repeat 1 1  Language- follow 3 step command 3 3  Language- read & follow direction 1 1  Write a sentence 1 1  Copy design 1 1  Total score 29 30        03/29/2024    3:30 PM  6CIT Screen  What Year? 0 points  What month? 0 points  What time? 0 points  Count back from 20 0 points  Months in reverse 0 points  Repeat phrase 6 points  Total Score 6 points    Immunizations Immunization History  Administered Date(s) Administered    Fluad Quad(high Dose 65+) 05/20/2022   INFLUENZA, HIGH DOSE SEASONAL PF 06/05/2021, 03/24/2023   Influenza Inj Mdck Quad With Preservative 04/21/2018, 05/18/2019, 06/07/2020   PFIZER(Purple Top)SARS-COV-2 Vaccination 08/30/2019, 09/19/2019, 10/12/2019   PPD Test 01/25/2014, 02/05/2015, 02/27/2016, 04/21/2018, 05/18/2019   Pneumococcal Conjugate-13 08/13/2020   Pneumococcal Polysaccharide-23 11/23/2008, 12/11/2021   Td 11/29/2020   Tdap 11/29/2009   Unspecified SARS-COV-2 Vaccination 09/19/2019   Zoster Recombinant(Shingrix) 09/22/2020, 08/23/2022    Screening Tests Health Maintenance  Topic Date Due   Mammogram  04/21/2024   Influenza Vaccine  09/20/2024 (Originally 01/22/2024)   COVID-19 Vaccine (5 - 2025-26 season) 03/29/2025 (Originally 02/22/2024)   Colonoscopy  08/31/2024   Medicare Annual Wellness (AWV)  03/29/2025   DTaP/Tdap/Td (3 - Td or Tdap) 11/30/2030   Pneumococcal Vaccine: 50+ Years  Completed   DEXA SCAN  Completed   Hepatitis  C Screening  Completed   Zoster Vaccines- Shingrix  Completed   Meningococcal B Vaccine  Aged Out    Health Maintenance Items Addressed: Declines flu vaccine today stating she will get it at her pharmacy.  Declines COVID vaccine. Will do mammogram at end of this month.   Additional Screening:  Vision Screening: Recommended annual ophthalmology exams for early detection of glaucoma and other disorders of the eye. Is the patient up to date with their annual eye exam?  Yes  Who is the provider or what is the name of the office in which the patient attends annual eye exams? [facility]  Dental Screening: Recommended annual dental exams for proper oral hygiene  Community Resource Referral / Chronic Care Management: CRR required this visit?  Yes   CCM required this visit?  No   Plan:    I have personally reviewed and noted the following in the patient's chart:   Medical and social history Use of alcohol, tobacco or illicit drugs   Current medications and supplements including opioid prescriptions. Patient is not currently taking opioid prescriptions. Functional ability and status Nutritional status Physical activity Advanced directives List of other physicians Hospitalizations, surgeries, and ER visits in previous 12 months Vitals Screenings to include cognitive, depression, and falls Referrals and appointments  In addition, I have reviewed and discussed with patient certain preventive protocols, quality metrics, and best practice recommendations. A written personalized care plan for preventive services as well as general preventive health recommendations were provided to patient.   Lolita Libra, CMA   03/29/2024   After Visit Summary: (In Person-Printed) AVS printed and given to the patient  Notes: see phone note

## 2024-03-29 NOTE — Telephone Encounter (Signed)
 Pt had AWV today.    Pt recently diagnosed with severe sleep apnea. Her heart rate is 48 today (confirmed with manual check). Lowest since her visit with you in July. Pt denies dizziness, lightheadedness. No chest pain, SOB. Can this be related to her OSA?  I scheduled her an OV to reassess with you on 04/11/24 due to other appts she has between now and then. Should she keep that appt?  2.  Pt reports social isolation and general anxiety. I placed a VBCI referral for both of these today.

## 2024-03-30 ENCOUNTER — Encounter: Payer: Self-pay | Admitting: Neurology

## 2024-03-30 ENCOUNTER — Ambulatory Visit (INDEPENDENT_AMBULATORY_CARE_PROVIDER_SITE_OTHER): Admitting: Neurology

## 2024-03-30 ENCOUNTER — Telehealth: Payer: Self-pay

## 2024-03-30 ENCOUNTER — Other Ambulatory Visit

## 2024-03-30 VITALS — BP 119/60 | HR 52 | Ht 68.0 in | Wt 161.0 lb

## 2024-03-30 DIAGNOSIS — G4486 Cervicogenic headache: Secondary | ICD-10-CM

## 2024-03-30 DIAGNOSIS — G629 Polyneuropathy, unspecified: Secondary | ICD-10-CM

## 2024-03-30 MED ORDER — GABAPENTIN 300 MG PO CAPS: 300.0000 mg | ORAL_CAPSULE | Freq: Every day | ORAL | 5 refills | Status: AC

## 2024-03-30 NOTE — Telephone Encounter (Signed)
 Only if symptoms is fine since this seems chronic for her. If she has a way to occasionally monitor her heart rate (smart watch, pulse ox, etc) she can keep an eye on it and follow-up if she sees anything alarming or has symptoms (palpitations, dizzy/lightheaded, fatigue, chest pain, shortness of breath, etc).

## 2024-03-30 NOTE — Telephone Encounter (Signed)
 Attempted to reach pt, left message to check mychart message sent re: below recommendation.

## 2024-03-30 NOTE — Progress Notes (Signed)
 Complex Care Management Note Care Guide Note  03/30/2024 Name: Katie Cox MRN: [id number] DOB: August 08, 1955   Complex Care Management Outreach Attempts: An unsuccessful telephone outreach was attempted today to offer the patient information about available complex care management services.  Follow Up Plan:  Additional outreach attempts will be made to offer the patient complex care management information and services.   Encounter Outcome:  No Answer  Dreama [facility]  [facility], [facility] VBCI Assistant Direct Dial: [phone]  Fax: [phone]

## 2024-03-30 NOTE — Telephone Encounter (Signed)
 Thank you. Looks like last several EKGs have been 47-57 bpm. Follow-up sooner if any symptoms develop.

## 2024-03-30 NOTE — Patient Instructions (Addendum)
 I saw you today for imbalance and headaches.  I think you imbalance is related to the numbness and tingling in your legs. Your symptoms are consistent with neuropathy. I am not sure the cause of your neuropathy though.  I want to investigate further with blood work today. I will be in touch when I have the results.  I think your headaches may be from neck tightness. I would like to send you to physical therapy to work on this and see if it helps with headaches.  I will also start a medication called gabapentin to help with your tingling and burning symptoms. It can sometimes help with headaches as well. You will take 300 mg at bedtime. Gabapentin can cause sleepiness or dizziness.  You can also try Lidocaine  cream as needed. Apply wear you have pain, tingling, or burning. Wear gloves to prevent your hands being numb. This can be bought over the counter at any drug store or online.  Alpha lipoic acid 600mg  daily has some research data suggesting it helps with nerve health. No major side effects other than <1% of people report upset stomach. This can be taken twice per day (1200mg  daily) if no relief obtained. You can buy this over the counter or online.   Vitamins and herbs that show potential to help with headaches:  Magnesium : Magnesium  (250 mg twice a day or 500 mg at bed) has a relaxant effect on smooth muscles such as blood vessels. Individuals suffering from frequent or daily headache usually have low magnesium  levels which can be increase with daily supplementation of 400-750 mg. Three trials found 40-90% average headache reduction when used as a preventative. Magnesium  also demonstrated the benefit in menstrually related migraine. Magnesium  is part of the messenger system in the serotonin cascade and it is a good muscle relaxant. It is also useful for constipation which can be a side effect of other medications used to treat migraine. Good sources include nuts, whole grains, and  tomatoes.  Riboflavin (vitamin B 2) 200 mg twice a day. This vitamin assists nerve cells in the production of ATP a principal energy storing molecule. It is necessary for many chemical reactions in the body. There have been at least 3 clinical trials of riboflavin using 400 mg per day all of which suggested that migraine frequency can be decreased. All 3 trials showed significant improvement in over half of migraine sufferers. The supplement is found in bread, cereal, milk, meat, and poultry. Most Americans get more riboflavin than the recommended daily allowance, however riboflavin deficiency is not necessary for the supplements to help prevent headache.  Feverfew: Feverfew is a common garden herb native to [place] and popular in [place] as a treatment for disorders typically controlled by aspirin . The mechanism of action is unknown but is believed to be related to a chemical called parthenolide which helps the body use serotonin more effectively. Serotonin helps prevent migraine and assists with resolution when it occurs. Parthenolide also inhibits the release of histamine which is linked to pain and inflammation.  Consistency of active ingredients in different products can be a problem. Some formulations don't have the active ingredient (parthenolide) that prevents migraine. A parthenolide content of 0.2% is generally recommended. Typical dosage is one capsule 3 times a day.  Coenzyme Q10: This is present in almost all cells in the body and is critical component for the conversion of energy. Recent studies have shown that a nutritional supplement of CoQ10 can reduce the frequency of migraine attacks  by improving the energy production of cells as with riboflavin. Doses of 150 mg twice a day have been shown to be effective.  Melatonin: Increasing evidence shows correlation between melatonin secretion and headache conditions. Melatonin supplementation has decreased headache intensity and duration. It  is widely used as a sleep aid. Sleep is natures way of dealing with migraine. A dose of 3 mg is recommended to start for headaches including cluster headache. Higher doses up to 15 mg has been reviewed for use in Cluster headache and have been used. The rationale behind using melatonin for cluster is that many theories regarding the cause of Cluster headache center around the disruption of the normal circadian rhythm in the brain. This helps restore the normal circadian rhythm.  Ginger: Ginger has a small amount of antihistamine and anti-inflammatory action which may help headache. It is primarily used for nausea and may aid in the absorption of other medications.  I will see you back in clinic to check your progress in about 3 months. Please let me know if you have any questions or concerns in the meantime.   The physicians and staff at [facility] are committed to providing excellent care. You may receive a survey requesting feedback about your experience at our office. We strive to receive very good responses to the survey questions. If you feel that your experience would prevent you from giving the office a very good  response, please contact our office to try to remedy the situation. We may be reached at [phone]. Thank you for taking the time out of your busy day to complete the survey.  Venetia Potters, MD [facility]  Preventing Falls at [facility] are common, often dreaded events in the lives of older people. Aside from the obvious injuries and even death that may result, fall can cause wide-ranging consequences including loss of independence, mental decline, decreased activity and mobility. Younger people are also at risk of falling, especially those with chronic illnesses and fatigue.  Ways to reduce risk for falling Examine diet and medications. Warm foods and alcohol dilate blood vessels, which can lead to dizziness when standing. Sleep aids, antidepressants and pain  medications can also increase the likelihood of a fall.  Get a vision exam. Poor vision, cataracts and glaucoma increase the chances of falling.  Check foot gear. Shoes should fit snugly and have a sturdy, nonskid sole and a broad, low heel  Participate in a physician-approved exercise program to build and maintain muscle strength and improve balance and coordination. Programs that use ankle weights or stretch bands are excellent for muscle-strengthening. Water aerobics programs and low-impact Tai Chi programs have also been shown to improve balance and coordination.  Increase vitamin D  intake. Vitamin D  improves muscle strength and increases the amount of calcium  the body is able to absorb and deposit in bones.  How to prevent falls from common hazards Floors - Remove all loose wires, cords, and throw rugs. Minimize clutter. Make sure rugs are anchored and smooth. Keep furniture in its usual place.  Chairs -- Use chairs with straight backs, armrests and firm seats. Add firm cushions to existing pieces to add height.  Bathroom - Install grab bars and non-skid tape in the tub or shower. Use a bathtub transfer bench or a shower chair with a back support Use an elevated toilet seat and/or safety rails to assist standing from a low surface. Do not use towel racks or bathroom tissue holders to help you stand.  Lighting -  Make sure halls, stairways, and entrances are well-lit. Install a night light in your bathroom or hallway. Make sure there is a light switch at the top and bottom of the staircase. Turn lights on if you get up in the middle of the night. Make sure lamps or light switches are within reach of the bed if you have to get up during the night.  Kitchen - Install non-skid rubber mats near the sink and stove. Clean spills immediately. Store frequently used utensils, pots, pans between waist and eye level. This helps prevent reaching and bending. Sit when getting things out of lower  cupboards.  Living room/ Bedrooms - Place furniture with wide spaces in between, giving enough room to move around. Establish a route through the living room that gives you something to hold onto as you walk.  Stairs - Make sure treads, rails, and rugs are secure. Install a rail on both sides of the stairs. If stairs are a threat, it might be helpful to arrange most of your activities on the lower level to reduce the number of times you must climb the stairs.  Entrances and doorways - Install metal handles on the walls adjacent to the doorknobs of all doors to make it more secure as you travel through the doorway.  Tips for maintaining balance Keep at least one hand free at all times. Try using a backpack or fanny pack to hold things rather than carrying them in your hands. Never carry objects in both hands when walking as this interferes with keeping your balance.  Attempt to swing both arms from front to back while walking. This might require a conscious effort if Parkinson's disease has diminished your movement. It will, however, help you to maintain balance and posture, and reduce fatigue.  Consciously lift your feet off of the ground when walking. Shuffling and dragging of the feet is a common culprit in losing your balance.  When trying to navigate turns, use a U technique of facing forward and making a wide turn, rather than pivoting sharply.  Try to stand with your feet shoulder-length apart. When your feet are close together for any length of time, you increase your risk of losing your balance and falling.  Do one thing at a time. Don't try to walk and accomplish another task, such as reading or looking around. The decrease in your automatic reflexes complicates motor function, so the less distraction, the better.  Do not wear rubber or gripping soled shoes, they might catch on the floor and cause tripping.  Move slowly when changing positions. Use deliberate, concentrated  movements and, if needed, use a grab bar or walking aid. Count 15 seconds between each movement. For example, when rising from a seated position, wait 15 seconds after standing to begin walking.  If balance is a continuous problem, you might want to consider a walking aid such as a cane, walking stick, or walker. Once you've mastered walking with help, you might be ready to try it on your own again.

## 2024-03-30 NOTE — Telephone Encounter (Signed)
 Ok for clarification, should she keep appt to reassess HR on 10/20 or cancel unless she develops symptoms?

## 2024-03-31 ENCOUNTER — Telehealth: Payer: Self-pay | Admitting: *Deleted

## 2024-03-31 ENCOUNTER — Telehealth: Payer: Self-pay | Admitting: Primary Care

## 2024-03-31 NOTE — Telephone Encounter (Signed)
 Copied from CRM [id number]. Topic: General - Other >> Mar 31, 2024  7:37 AM Berneda FALCON wrote: Reason for CRM: Patient is requesting Lolita call her back please.  Patient callback is [phone] (home) [phone] (work)

## 2024-03-31 NOTE — Telephone Encounter (Signed)
 Notified pt and she voices understanding. Appt for 10/20 cancelled as pt has been asymptomatic.

## 2024-03-31 NOTE — Telephone Encounter (Signed)
 Copied from CRM [id number]. Topic: Clinical - Order For Equipment >> Mar 31, 2024  2:19 PM Celestine FALCON wrote: Reason for CRM: Pt is calling to get an update on her cpap machine and supplies order. Pt stated she still has yet to receive her cpap.   She stated she got a call from a [place]  Number claiming to be [employer] mentioning a Dr. Gust, but she sees NP Almarie Ferrari which had her confused and concerned on the message from [employer]. Pt doesn't know if she would need to pick up the equipment locally, have a mask fitting, or if it would all be shipped to her address.  Pt would like a follow up call from the clinic to discuss where the order is with her cpap. Pt's phone number is [phone] ok to leave a vm.

## 2024-04-01 ENCOUNTER — Telehealth: Payer: Self-pay | Admitting: Neurology

## 2024-04-01 NOTE — Telephone Encounter (Signed)
 Name: Katie Cox: self  Reason for the Call: Caller states she had an appt with Dr. Leigh and haven't heard anything back yet from her lab results. (Blood work).

## 2024-04-01 NOTE — Telephone Encounter (Signed)
 Called and informed pt of lab had not returned yet . She understood

## 2024-04-03 ENCOUNTER — Encounter: Payer: Self-pay | Admitting: Neurology

## 2024-04-04 ENCOUNTER — Ambulatory Visit: Admitting: Primary Care

## 2024-04-04 NOTE — Progress Notes (Unsigned)
 Complex Care Management Note Care Guide Note  04/04/2024 Name: Katie Cox MRN: [id number] DOB: [birth year]   Complex Care Management Outreach Attempts: A second unsuccessful outreach was attempted today to offer the patient with information about available complex care management services.  Follow Up Plan:  Additional outreach attempts will be made to offer the patient complex care management information and services.   Encounter Outcome:  No Answer  Dreama [facility]  [facility], [facility] VBCI Assistant Direct Dial: [phone]  Fax: [phone]

## 2024-04-05 LAB — ANTI-NUCLEAR AB-TITER (ANA TITER)
ANA TITER: 1:40 {titer} — ABNORMAL HIGH
ANA Titer 1: 1:40 {titer} — ABNORMAL HIGH

## 2024-04-05 LAB — VITAMIN B1: Vitamin B1 (Thiamine): 43 nmol/L — ABNORMAL HIGH (ref 8–30)

## 2024-04-05 LAB — IMMUNOFIXATION ELECTROPHORESIS
IgM, Serum: 122 mg/dL (ref 50–300)
IgM, Serum: 834 mg/dL (ref 600–300)
Immunoglobulin A: 80 mg/dL (ref 70–320)
Immunoglobulin A: 834 mg/dL (ref 70–320)

## 2024-04-05 LAB — VITAMIN E
Gamma-Tocopherol (Vit E): <1 mg/L (ref <4.4)
Vitamin E (Alpha Tocopherol): 14.7 mg/L (ref 5.7–19.9)

## 2024-04-05 LAB — COPPER, SERUM: Copper: 127 ug/dL (ref 70–175)

## 2024-04-05 LAB — ANA: Anti Nuclear Antibody (ANA): POSITIVE — AB

## 2024-04-05 NOTE — Progress Notes (Signed)
 Complex Care Management Note  Care Guide Note 04/05/2024 Name: [name] MRN: [id number] DOB: 06-14-1956  Katie Cox is a 68 y.o. year old female who sees Almarie Waddell NOVAK, NP for primary care. I reached out to [name] by phone today to offer complex care management services.  [name] was given information about Complex Care Management services today including:   The Complex Care Management services include support from the care team which includes your Nurse Care Manager, Clinical Social Worker, or Pharmacist.  The Complex Care Management team is here to help remove barriers to the health concerns and goals most important to you. Complex Care Management services are voluntary, and the patient may decline or stop services at any time by request to their care team member.   Complex Care Management Consent Status: Patient did not agree to participate in complex care management services at this time.  Follow up plan:  Patient will follow up with PCP.  Encounter Outcome:  Patient Refused  Dreama Agent [facility], [facility] VBCI Assistant Direct Dial: [phone]  Fax: [phone]

## 2024-04-06 ENCOUNTER — Ambulatory Visit: Payer: Self-pay | Admitting: Neurology

## 2024-04-06 NOTE — Telephone Encounter (Signed)
 I called and spoke with the pt  She is already set up to get her CPAP on 04/11/24  Nothing further needed

## 2024-04-08 ENCOUNTER — Ambulatory Visit: Admitting: Physician Assistant

## 2024-04-08 ENCOUNTER — Other Ambulatory Visit: Payer: Self-pay | Admitting: Nurse Practitioner

## 2024-04-08 DIAGNOSIS — E790 Hyperuricemia without signs of inflammatory arthritis and tophaceous disease: Secondary | ICD-10-CM

## 2024-04-08 DIAGNOSIS — K219 Gastro-esophageal reflux disease without esophagitis: Secondary | ICD-10-CM

## 2024-04-11 ENCOUNTER — Ambulatory Visit: Admitting: Family Medicine

## 2024-04-14 ENCOUNTER — Other Ambulatory Visit: Payer: Self-pay | Admitting: Family Medicine

## 2024-04-14 DIAGNOSIS — K219 Gastro-esophageal reflux disease without esophagitis: Secondary | ICD-10-CM

## 2024-04-14 DIAGNOSIS — E790 Hyperuricemia without signs of inflammatory arthritis and tophaceous disease: Secondary | ICD-10-CM

## 2024-04-14 NOTE — Telephone Encounter (Unsigned)
 Copied from CRM [id number]. Topic: Clinical - Medication Refill >> Apr 14, 2024  2:15 PM Tanazia G wrote: Medication:  allopurinol  (ZYLOPRIM ) 300 MG tablet  famotidine  (PEPCID ) 40 MG tablet   Has the patient contacted their pharmacy? Yes (Agent: If no, request that the patient contact the pharmacy for the refill. If patient does not wish to contact the pharmacy document the reason why and proceed with request.) (Agent: If yes, when and what did the pharmacy advise?)  This is the patient's preferred pharmacy:  [facility] - [place], [place] - [address] [place] [address] [place] [address] Phone: [phone] Fax: [phone]  [facility] [address] [place], [place] - [address] [place] [address] Phone: [phone] Fax: [phone]  [facility] - [place], [place] - [address] [place]  [address] Phone: [phone] Fax: [phone]  Is this the correct pharmacy for this prescription? Yes If no, delete pharmacy and type the correct one.   Has the prescription been filled recently? Yes  Is the patient out of the medication? Yes  Has the patient been seen for an appointment in the last year OR does the patient have an upcoming appointment? Yes  Can we respond through MyChart? Yes  Agent: Please be advised that Rx refills may take up to 3 business days. We ask that you follow-up with your pharmacy.

## 2024-04-15 MED ORDER — ALLOPURINOL 300 MG PO TABS: ORAL_TABLET | ORAL | 2 refills | Status: AC

## 2024-04-15 MED ORDER — FAMOTIDINE 40 MG PO TABS: ORAL_TABLET | ORAL | 2 refills | Status: AC

## 2024-05-03 ENCOUNTER — Ambulatory Visit
Admission: RE | Admit: 2024-05-03 | Discharge: 2024-05-03 | Disposition: A | Source: Ambulatory Visit | Attending: Family Medicine

## 2024-05-03 DIAGNOSIS — Z1231 Encounter for screening mammogram for malignant neoplasm of breast: Secondary | ICD-10-CM

## 2024-05-04 ENCOUNTER — Ambulatory Visit: Admitting: Primary Care

## 2024-05-09 ENCOUNTER — Other Ambulatory Visit: Payer: Self-pay | Admitting: Neurology

## 2024-05-09 DIAGNOSIS — F419 Anxiety disorder, unspecified: Secondary | ICD-10-CM

## 2024-05-09 MED ORDER — BUSPIRONE HCL 5 MG PO TABS: ORAL_TABLET | ORAL | 1 refills | Status: AC

## 2024-05-23 ENCOUNTER — Encounter: Payer: Self-pay | Admitting: Primary Care

## 2024-05-23 ENCOUNTER — Ambulatory Visit: Admitting: Primary Care

## 2024-05-23 VITALS — BP 124/66 | HR 56 | Temp 97.8°F | Ht 68.0 in | Wt 162.0 lb

## 2024-05-23 DIAGNOSIS — R400 Somnolence: Secondary | ICD-10-CM

## 2024-05-23 DIAGNOSIS — G4733 Obstructive sleep apnea (adult) (pediatric): Secondary | ICD-10-CM

## 2024-05-23 NOTE — Patient Instructions (Addendum)
  VISIT SUMMARY: During your visit, we discussed your severe sleep apnea and your compliance with CPAP therapy, as well as your peripheral neuropathy and recent weight gain. We reviewed your current symptoms and made adjustments to your treatment plan to help improve your overall health and well-being.  YOUR PLAN: -OBSTRUCTIVE SLEEP APNEA: Obstructive sleep apnea is a condition where your airway becomes blocked during sleep, causing breathing pauses. You have been using your CPAP machine regularly, which has helped reduce your headaches and fatigue. We adjusted your CPAP pressure settings to help with the air leaks you are experiencing. If the air leaks continue, please contact us  for a potential mask fitting in the sleep lab. Continue using your CPAP machine every night for at least 4-6 hours.  -PERIPHERAL NEUROPATHY WITH LOWER EXTREMITY EDEMA: Peripheral neuropathy is a condition that affects the nerves in your extremities, causing balance issues and swelling. You have been using compression stockings and performing leg massages to manage your symptoms. We recommend discussing starting gabapentin  at a lower dose with a neurologist to help with your symptoms. Continue using compression stockings during the day and performing nightly leg massages. You can also explore YouTube for lower body self-massage techniques for neuropathy.  -ABNORMAL WEIGHT GAIN: You have experienced a weight gain of 20-30 pounds despite having a normal BMI. This could be due to postmenopausal changes and a decreased metabolism. We encourage you to make lifestyle modifications, including a healthy diet and regular exercise, to help manage your weight.  INSTRUCTIONS: Please follow up with a neurologist to discuss starting gabapentin  at a lower dose. If you continue to experience air leaks with your CPAP mask, contact us  for a potential mask fitting in the sleep lab.  Follow-up 6 months with Katie Cox for CPAP compliance

## 2024-05-23 NOTE — Progress Notes (Signed)
 @Patient  ID: Katie Cox, female    DOB: 07-02-1955, 68 y.o.   MRN: [id number]  No chief complaint on file.   Referring provider: Almarie Waddell NOVAK, NP  HPI: 68 year old female, never smoked. PMH HTN, GERD, IBS, osteopenia, CKD, hyperlipidemia.  Previous [facility] pulmonary encounter:  02/08/2024 Discussed the use of AI scribe software for clinical note transcription with the patient, who gave verbal consent to proceed.  History of Present Illness Katie Cox is a 68 year old female who presents with excessive daytime sleepiness and potential sleep apnea. She was referred by her primary care provider, Waddell, for evaluation of sleep issues.  She has been experiencing excessive daytime sleepiness over the past month, particularly in the mornings while driving and during sedentary activities such as reading or working. She feels sleepy and wants to close her eyes but manages to catch herself before doing so. This is a new development for her.  Her sleep pattern includes going to bed between 9 and 10 PM and waking up at 5 AM. Falling asleep is not immediate, and she often tosses and turns. She sometimes wakes up during the night, primarily to use the bathroom, but not consistently every night. She lives alone and is unsure if she experiences sleep apnea, though she has been told by family members that she snores and has occasionally woken herself up snoring. She also experiences dry mouth at night, which she attributes to sleeping with her mouth open.  She has been experiencing headaches over the last six months, which have become more frequent recently. A CT scan of her head in July was negative. She is also experiencing dizziness and balance issues, for which she is scheduled to see a neurologist in October.  Her family history includes a brother and a son who have been diagnosed with sleep apnea. She had a goiter removed in the past but is not on thyroid  medication. A thyroid  level was checked  in July and the result was normal.  She reports a weight gain of 30 pounds, although her BMI remains normal. She also retains a lot of fluid, which she believes contributes to her weight gain.  Assessment & Plan Excessive daytime sleepiness and suspected obstructive sleep apnea Excessive daytime sleepiness occurs during driving and at rest, such as reading or sitting. Sleep pattern includes difficulty falling asleep, occasional awakenings, and snoring, suggesting obstructive sleep apnea. Symptoms like dry mouth and headaches may be related to sleep apnea. Differential diagnosis includes narcolepsy, but sleep apnea is suspected due to symptoms and family history. Normal BMI reduces the likelihood of obesity-related sleep apnea. Reviewed risk of untreated sleep apnea and treatment options. - Order home sleep study to evaluate for obstructive sleep apnea. - Instruct her to send a message on MyChart upon completion of the sleep study. - Schedule a virtual follow-up visit in six weeks to discuss sleep study results. - Advise her to avoid driving if experiencing excessive daytime sleepiness. - Discuss potential further testing for narcolepsy if sleep study is negative.   05/23/2024- Interim hx Discussed the use of AI scribe software for clinical note transcription with the patient, who gave verbal consent to proceed. History of Present Illness Katie Cox is a 68 year old female with severe sleep apnea who presents for a compliance check-in for CPAP use.  She experiences excessive daytime sleepiness, particularly in the mornings while driving and during sedentary activities like reading or working. Her sleep pattern involves going to bed  between 9 and 10 PM and waking up at 5 AM. She has difficulty falling asleep, often tossing and turning, and sometimes wakes up at night to use the bathroom. She lives alone and is unsure if she snores. She also experiences dry mouth, headaches, and balance  issues.  A home sleep study was performed in August 2025 which showed severe OSA, AHI 59/hour. She started using a CPAP machine fall 2025. She has been compliant with the CPAP, using it 29 out of 30 days, with 26 of those days being more than four hours of use. She reports a reduction in headaches and fatigue since starting CPAP therapy, although she has not noticed any weight loss. She mentions a weight gain of about 30 pounds, despite a normal BMI of 24, and attributes some of it to fluid retention.  She has neuropathy, which she believes contributes to her balance issues. She was prescribed gabapentin  to be taken at bedtime but has not started it due to concerns about side effects such as nausea, diarrhea, and weight gain. 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  She reports some issues with the CPAP mask, including air leaks and a whistling sound, which she finds annoying. She has tried different masks, and her current mask is more comfortable than the previous one, although it still causes some air leaks. Her apnea score was 59 during the sleep study and is now 5.7, with some nights as low as 1.7.       Allergies  Allergen Reactions   Alprazolam Other (See Comments)     Unknown reaction per pt   Lexapro  [Escitalopram ]     Diarrhea/nausea     Immunization History  Administered Date(s) Administered   Fluad Quad(high Dose 65+) 05/20/2022   INFLUENZA, HIGH DOSE SEASONAL PF 06/05/2021, 03/24/2023   Influenza Inj Mdck Quad With Preservative 04/21/2018, 05/18/2019, 06/07/2020   PFIZER(Purple Top)SARS-COV-2 Vaccination 08/30/2019, 09/19/2019, 10/12/2019   PPD Test 01/25/2014, 02/05/2015, 02/27/2016, 04/21/2018, 05/18/2019   Pneumococcal Conjugate-13 08/13/2020   Pneumococcal Polysaccharide-23 11/23/2008, 12/11/2021   Td 11/29/2020   Tdap 11/29/2009   Unspecified SARS-COV-2 Vaccination 09/19/2019    Zoster Recombinant(Shingrix) 09/22/2020, 08/23/2022    Past Medical History:  Diagnosis Date   Abnormal glucose 02/05/2015   Allergy    seasonal   Anxiety    Contact lens/glasses fitting    wears contacts or glasses   Diverticulosis    GERD (gastroesophageal reflux disease)    Hyperlipidemia    Hyperuricemia 08/13/2014   Irritable bowel syndrome 10/18/2007   Irritable bowel syndrome (IBS)    Medication management 12/26/2013   OSA (obstructive sleep apnea)    Peripheral neuropathy    in feet   POLYP, COLON 10/18/2007   PONV (postoperative nausea and vomiting)    nausea, slow to wake up at times   Vitamin D  deficiency     Tobacco History: Social History   Tobacco Use  Smoking Status Never  Smokeless Tobacco Never   Counseling given: Not Answered   Outpatient Medications Prior to Visit  Medication Sig Dispense Refill   Acetaminophen  (TYLENOL  PO) Take by mouth. (Patient taking differently: Take by mouth as needed.)     allopurinol  (ZYLOPRIM ) 300 MG tablet TAKE 1 TABLET BY MOUTH  DAILY FOR GOUT PREVENTION 100 tablet 2   Ascorbic Acid (VITAMIN C  WITH ROSE HIPS) 1000 MG tablet Take 1,000 mg by mouth daily.     aspirin  81  MG tablet Take 81 mg by mouth every other day.      bumetanide  (BUMEX ) 2 MG tablet TAKE 1 TABLET BY MOUTH TWICE  DAILY FOR BLOOD PRESSURE AND  FLUID 200 tablet 2   busPIRone  (BUSPAR ) 5 MG tablet TAKE 1 TABLET BY MOUTH UP TO 3  TIMES DAILY AS NEEDED FOR  ANXIETY 240 tablet 1   Cholecalciferol (VITAMIN D3) 125 MCG (5000 UT) CAPS Take by mouth daily.     Cyanocobalamin  (B-12 PO) Take by mouth daily.     famotidine  (PEPCID ) 40 MG tablet TAKE 1 TABLET BY MOUTH AT  BEDTIME TO PREVENT  INDIGESTION AND HEARTBURN 100 tablet 2   fluticasone  (FLONASE ) 50 MCG/ACT nasal spray Place 1 spray into both nostrils daily. (Patient taking differently: Place 1 spray into both nostrils daily as needed.)     gabapentin  (NEURONTIN ) 300 MG capsule Take 1 capsule (300 mg total) by  mouth at bedtime. 30 capsule 5   magnesium  gluconate (MAGONATE) 500 MG tablet Take 500 mg by mouth 2 (two) times daily. (Patient taking differently: Take 500 mg by mouth daily.)     Multiple Vitamin (MULTIVITAMIN) tablet Take 1 tablet by mouth daily.     omega-3 acid ethyl esters (LOVAZA) 1 g capsule Take 1 capsule by mouth daily.     OVER THE COUNTER MEDICATION Takes OTC Allegra for allergies.     potassium chloride  SA (KLOR-CON  M) 20 MEQ tablet TAKE 1 TABLET BY MOUTH TWICE  DAILY 180 tablet 3   rosuvastatin  (CRESTOR ) 20 MG tablet Take 1 tablet (20 mg total) by mouth daily. 90 tablet 3   Zinc 50 MG CAPS Take by mouth daily.     No facility-administered medications prior to visit.   Review of Systems  Review of Systems  Constitutional: Negative.   Respiratory: Negative.    Psychiatric/Behavioral: Negative.     Physical Exam  There were no vitals taken for this visit. Physical Exam Constitutional:      Appearance: Normal appearance. She is well-developed.  HENT:     Head: Normocephalic and atraumatic.     Mouth/Throat:     Mouth: Mucous membranes are moist.     Pharynx: Oropharynx is clear.  Eyes:     Pupils: Pupils are equal, round, and reactive to light.  Cardiovascular:     Rate and Rhythm: Normal rate and regular rhythm.     Heart sounds: Normal heart sounds. No murmur heard. Pulmonary:     Effort: Pulmonary effort is normal. No respiratory distress.     Breath sounds: Normal breath sounds. No wheezing or rhonchi.  Musculoskeletal:        General: Normal range of motion.     Cervical back: Normal range of motion and neck supple.  Skin:    General: Skin is warm and dry.     Findings: No erythema or rash.  Neurological:     General: No focal deficit present.     Mental Status: She is alert and oriented to person, place, and time. Mental status is at baseline.  Psychiatric:        Mood and Affect: Mood normal.        Behavior: Behavior normal.        Thought Content:  Thought content normal.        Judgment: Judgment normal.    Lab Results:  CBC    Component Value Date/Time   WBC 7.8 01/14/2024 1524   RBC 4.32 01/14/2024 1524   HGB  14.0 01/14/2024 1524   HGB 12.3 07/21/2008 1509   HGB 13.1 10/10/2005 1541   HCT 41.5 01/14/2024 1524   HCT 35.0 07/21/2008 1509   HCT 36.9 10/10/2005 1541   PLT 273.0 01/14/2024 1524   PLT 391 07/21/2008 1509   PLT 410 (H) 10/10/2005 1541   MCV 96.2 01/14/2024 1524   MCV 90 07/21/2008 1509   MCV 92.7 10/10/2005 1541   MCH 32.6 03/24/2023 1149   MCHC 33.9 01/14/2024 1524   RDW 13.2 01/14/2024 1524   RDW 10.9 07/21/2008 1509   RDW 11.9 10/10/2005 1541   LYMPHSABS 2.7 01/14/2024 1524   LYMPHSABS 5.0 (H) 07/21/2008 1509   LYMPHSABS 5.4 (H) 10/10/2005 1541   MONOABS 0.6 01/14/2024 1524   MONOABS 0.9 10/10/2005 1541   EOSABS 0.1 01/14/2024 1524   EOSABS 0.3 07/21/2008 1509   BASOSABS 0.1 01/14/2024 1524   BASOSABS 0.1 07/21/2008 1509   BASOSABS 0.1 10/10/2005 1541    BMET    Component Value Date/Time   NA 142 01/14/2024 1524   K 4.3 01/14/2024 1524   CL 103 01/14/2024 1524   CO2 22 01/14/2024 1524   GLUCOSE 102 (H) 01/14/2024 1524   BUN 33 (H) 01/14/2024 1524   CREATININE 1.29 (H) 01/14/2024 1524   CREATININE 1.06 (H) 03/24/2023 1149   CALCIUM  9.7 01/14/2024 1524   GFRNONAA 60 (L) 07/04/2021 1348   GFRNONAA 56 (L) 11/29/2020 0000   GFRAA 65 11/29/2020 0000    BNP No results found for: BNP  ProBNP No results found for: PROBNP  Imaging: MM 3D SCREENING MAMMOGRAM BILATERAL BREAST Result Date: 05/06/2024 CLINICAL DATA:  Screening. EXAM: DIGITAL SCREENING BILATERAL MAMMOGRAM WITH TOMOSYNTHESIS AND CAD TECHNIQUE: Bilateral screening digital craniocaudal and mediolateral oblique mammograms were obtained. Bilateral screening digital breast tomosynthesis was performed. The images were evaluated with computer-aided detection. COMPARISON:  Previous exam(s). ACR Breast Density Category b: There are  scattered areas of fibroglandular density. FINDINGS: There are no findings suspicious for malignancy. IMPRESSION: No mammographic evidence of malignancy. A result letter of this screening mammogram will be mailed directly to the patient. RECOMMENDATION: Screening mammogram in one year. (Code:SM-B-01Y) BI-RADS CATEGORY  1: Negative. Electronically Signed   By: Dina  Arceo M.D.   On: 05/06/2024 04:55     Assessment & Plan:   No problem-specific Assessment & Plan notes found for this encounter.   There are no diagnoses linked to this encounter.  Assessment and Plan Assessment & Plan Obstructive sleep apnea managed with CPAP Severe obstructive sleep apnea diagnosed via sleep study. CPAP therapy initiated on October 20th, 2024, with 97% compliance over the past 30 days. Reports improvement in headaches and fatigue. Apnea-hypopnea index reduced from 59 to 5.7 events per hour. Experiencing air leaks and mask fit issues, possibly due to high pressure settings. - We reviewed sleep study results and CPAP compliance  - Adjusted CPAP pressure settings from 5-20 cm H2O to 8-15 cm H2O to reduce air leaks. - Instructed to contact provider if air leaks persist for potential mask fitting in the sleep lab. - Continue CPAP therapy nightly for a minimum of 4-6 hours. - FU in 6 months or sooner if needed   Peripheral neuropathy  Peripheral neuropathy contributing to balance issues. Gabapentin  prescribed but not initiated due to concerns about side effects (nausea, diarrhea, weight gain). Compression stockings used during the day. Reports swelling in legs, especially after work. - Consider contacting neurologist to discuss starting gabapentin  at a lower dose. - Continue using compression stockings  during the day. - Perform nightly leg massages to improve blood flow and manage edema. - Explore YouTube resources for lower body self-massage techniques for neuropathy.  Abnormal weight gain Reports weight gain of  20-30 pounds despite normal BMI. No direct correlation with CPAP use. Possible contributing factors include postmenopausal changes and decreased metabolism. Some improvement in energy levels noted with CPAP use, which may aid in lifestyle modifications. - Encouraged lifestyle modifications, including diet and exercise, to manage weight.  I personally spent a total of 30 minutes in the care of the patient today including counseling and educating, placing orders, documenting clinical information in the EHR, independently interpreting results, and communicating results.   Almarie LELON Ferrari, NP 05/23/2024

## 2024-05-26 ENCOUNTER — Telehealth: Payer: Self-pay

## 2024-05-26 DIAGNOSIS — G4733 Obstructive sleep apnea (adult) (pediatric): Secondary | ICD-10-CM

## 2024-05-26 NOTE — Telephone Encounter (Signed)
 Copied from CRM [id number]. Topic: Clinical - Medical Advice >> May 26, 2024  8:45 AM Katie Cox wrote: Reason for CRM: Patient was seen on monday & is requesting to speak with [facility] nurse in regard to her CPAP machine - its showing last activity on sunday but it has been running every night. Patient has already spoken with [facility].  Callback number: [phone]  I called and spoke to pt. Pt states the machine is not registering that she is using her machine and she uses it nightly. Pt states she called [facility] and [facility] states they do not see any activity on their behalf. Pt is worried that her insurance will no longer cover this since it is not showing data.   Beth, should we order [facility] new machine?

## 2024-05-26 NOTE — Telephone Encounter (Signed)
 If needed, then yes. I was able to pull a download during her recent visit from October-November.  Current settings 5-20cm h20

## 2024-05-26 NOTE — Telephone Encounter (Signed)
 I called and spoke to pt. Pt informed of Beth's note and pt agreed to have another machine ordered. I will order a replacement machine. nfn

## 2024-05-27 ENCOUNTER — Telehealth: Payer: Self-pay

## 2024-05-27 NOTE — Telephone Encounter (Signed)
 Copied from CRM #[id number]. Topic: Clinical - Medical Advice >> May 26, 2024  3:30 PM Isabell A wrote: Reason for CRM: Patient is requesting to speak with [name] - requesting to not process the order for CPAP.  Callback number: [phone]   [id number]

## 2024-05-27 NOTE — Telephone Encounter (Signed)
 Pt called back.   Pt states that [facility] told her that she could not get a new CPAP until they are able to test the machine to ensure it is really broken. She is getting her machine tested on 05/30/24 with [facility] and will let us  know if they are able to fix her current machine or not.  I advised pt that we should not cancel the order - so that if [facility] determines that she does need a replacement, the order will have already been placed and she will not need a new order.  Pt verbalized understanding, NFN for now.

## 2024-06-21 ENCOUNTER — Other Ambulatory Visit: Payer: Self-pay | Admitting: Family Medicine

## 2024-06-21 DIAGNOSIS — E782 Mixed hyperlipidemia: Secondary | ICD-10-CM

## 2024-06-27 NOTE — Progress Notes (Unsigned)
 "  I saw Katie Cox in neurology clinic on 07/07/24 in follow up for imbalance and headaches.  HPI: Katie Cox is a 69 y.o. year old female with a history of HLD, gout, GERD, IBS, anxiety, vit D deficiency, and OSA who we last saw on 03/30/24.  To briefly review: 03/30/24: Patient is most concerned about balance. Patient has difficulty with balance for the last year. She is not falling, but will lose balance and run into things. She has numbness and tingling in her feet. Her feet burn if she is very active. She was diagnosed with neuropathy about 10 years ago by EMG. She does not remember where this was done. She denies significant neck or back pain.   She has occasional numbness and tingling in her hands, mostly when waking from sleep.   She denies significant dry eyes. She has dry mouth, particularly in the morning. She has recently been diagnosed with sleep apnea and will be starting CPAP soon. She has occasional joint pain.   She has never been on neuropathic pain medication.   She takes B12, zinc, and Omega-3.    Patient has headaches that started about 6 months ago. She has 2-3 headaches per week. The pain is located around her forehead. It is an achy sensation, about 5/10. They typically last all day. She denies photophobia, phonophobia, or vomiting. She may occasionally has nausea. She thinks she stays hydrated. They are random. They may come more when she is at the computer. She has not noticed neck or shoulder tightness. She will take tylenol  when headaches are bad, maybe 2-3 times per week. This is ease the pain but not completely get rid of the headache. She wonders if headaches are related to stress. She does endorse a good bit of stress.   Patient is on Buspar  as needed for anxiety (takes once daily).   Smoker: No OCP/hormone use: No Caffiene use: 1 cup of decaf coffee daily EtOH use: No Restrictive diet: No Family history of neurologic disease including headaches:  No  Most recent Assessment and Plan (03/30/24): Katie Cox is a 69 y.o. female who presents for evaluation of imbalance and headaches. She has a relevant medical history of HLD, gout, GERD, IBS, anxiety, vit D deficiency, and OSA. Her neurological examination is pertinent for diminished sensation in bilateral lower extremities in a length dependent fashion and mild gait ataxia. She also has very tight and tender trapezius and paraspinal muscles despite not endorsing much neck pain. Available diagnostic data is significant for normal CT head, normal B12, and HbA1c of 5.4. Patient's numbness and tingling in the legs with diminished sensation on examination is consistent with a distal symmetric polyneuropathy. She has no known risk factors for PN. This is likely the cause of her imbalance though. I will check labs to look for treatable causes. Her headaches sound most consistent with tension headaches, likely from cervicalgia. She would like to try physical therapy for this. I will also start gabapentin  which could help neuropathic pain and headaches.    PLAN: -Blood work: B1, IFE, copper , vit E, ANA -Start gabapentin  300 mg at bedtime  -Lidocaine  cream PRN -Alpha lipoic acid 600 mg once or twice daily -Physical therapy for cervicalgia -OTC recommendations for headaches given in AVS  Since their last visit: Labs were unremarkable. ANA was positive at low titer (1:40).  Symptoms? Headaches? PT? Gabapentin ?  ROS: Pertinent positive and negative systems reviewed in HPI. ***   MEDICATIONS:  Outpatient  Encounter Medications as of 07/07/2024  Medication Sig Note   Acetaminophen  (TYLENOL  PO) Take by mouth. (Patient taking differently: Take by mouth as needed.)    allopurinol  (ZYLOPRIM ) 300 MG tablet TAKE 1 TABLET BY MOUTH  DAILY FOR GOUT PREVENTION    Ascorbic Acid (VITAMIN C  WITH ROSE HIPS) 1000 MG tablet Take 1,000 mg by mouth daily.    aspirin  81 MG tablet Take 81 mg by mouth every other  day.  01/25/2014: Takes every other day   bumetanide  (BUMEX ) 2 MG tablet TAKE 1 TABLET BY MOUTH TWICE  DAILY FOR BLOOD PRESSURE AND  FLUID    busPIRone  (BUSPAR ) 5 MG tablet TAKE 1 TABLET BY MOUTH UP TO 3  TIMES DAILY AS NEEDED FOR  ANXIETY    Cholecalciferol (VITAMIN D3) 125 MCG (5000 UT) CAPS Take by mouth daily.    Cyanocobalamin  (B-12 PO) Take by mouth daily.    famotidine  (PEPCID ) 40 MG tablet TAKE 1 TABLET BY MOUTH AT  BEDTIME TO PREVENT  INDIGESTION AND HEARTBURN    fluticasone  (FLONASE ) 50 MCG/ACT nasal spray Place 1 spray into both nostrils daily. (Patient taking differently: Place 1 spray into both nostrils daily as needed.) 04/21/2018: OTC    gabapentin  (NEURONTIN ) 300 MG capsule Take 1 capsule (300 mg total) by mouth at bedtime.    magnesium  gluconate (MAGONATE) 500 MG tablet Take 500 mg by mouth 2 (two) times daily. (Patient taking differently: Take 500 mg by mouth daily.)    Multiple Vitamin (MULTIVITAMIN) tablet Take 1 tablet by mouth daily.    omega-3 acid ethyl esters (LOVAZA) 1 g capsule Take 1 capsule by mouth daily.    OVER THE COUNTER MEDICATION Takes OTC Allegra for allergies.    potassium chloride  SA (KLOR-CON  M) 20 MEQ tablet TAKE 1 TABLET BY MOUTH TWICE  DAILY    rosuvastatin  (CRESTOR ) 20 MG tablet TAKE 1 TABLET BY MOUTH DAILY    Zinc 50 MG CAPS Take by mouth daily.    No facility-administered encounter medications on file as of 07/07/2024.    PAST MEDICAL HISTORY: Past Medical History:  Diagnosis Date   Abnormal glucose 02/05/2015   Allergy    seasonal   Anxiety    Contact lens/glasses fitting    wears contacts or glasses   Diverticulosis    GERD (gastroesophageal reflux disease)    Hyperlipidemia    Hyperuricemia 08/13/2014   Irritable bowel syndrome 10/18/2007   Irritable bowel syndrome (IBS)    Medication management 12/26/2013   OSA (obstructive sleep apnea)    Peripheral neuropathy    in feet   POLYP, COLON 10/18/2007   PONV (postoperative nausea and  vomiting)    nausea, slow to wake up at times   Vitamin D  deficiency     PAST SURGICAL HISTORY: Past Surgical History:  Procedure Laterality Date   carpel tunnel surgery Right    CHOLECYSTECTOMY  04/14/2012   Procedure: LAPAROSCOPIC CHOLECYSTECTOMY;  Surgeon: Donnice Bury, MD;  Location: [facility];  Service: General;  Laterality: N/A;   COLONOSCOPY     EYE SURGERY     at age 45yr   FOOT SURGERY     corn removal both feet - little toes on each foot   FRACTURE SURGERY     goiter removed fro thyroid      OPEN REDUCTION INTERNAL FIXATION (ORIF) DISTAL RADIAL FRACTURE Right 07/04/2015   Procedure: OPEN REDUCTION INTERNAL FIXATION (ORIF) RIGHT DISTAL RADIUS FRACTURE;  Surgeon: Prentice Pagan, MD;  Location: [facility];  Service: Orthopedics;  Laterality: Right;   POLYPECTOMY     ROTATOR CUFF REPAIR Right 2023   WISDOM TOOTH EXTRACTION      ALLERGIES: Allergies[1]  FAMILY HISTORY: Family History  Problem Relation Age of Onset   Hypertension Mother    Arrhythmia Mother    Colon cancer Father 65   Colon polyps Father    Cancer Father    Macular degeneration Brother    COPD Paternal Grandfather    Breast cancer Maternal Aunt    Esophageal cancer Neg Hx    Rectal cancer Neg Hx    Stomach cancer Neg Hx     SOCIAL HISTORY: Social History[2] Social History   Social History Narrative   Are you right handed or left handed? Right   Are you currently employed ?    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 yes   Who lives with you?    What type of home do you live in: 1 story or 2 story? one    Caffiene occas    Objective:  Vital Signs:  There were no vitals taken for this visit.  General:*** General appearance: Awake and alert. No distress. Cooperative with exam.  Skin: No obvious rash or jaundice. HEENT: Atraumatic. Anicteric. Lungs: Non-labored breathing on room air  Heart: Regular Abdomen: Soft, non tender. Extremities:  No edema. No obvious deformity.  Musculoskeletal: No obvious joint swelling.  Neurological: Mental Status: Alert. Speech fluent. No pseudobulbar affect Cranial Nerves: CNII: No RAPD. Visual fields intact. CNIII, IV, VI: PERRL. No nystagmus. EOMI. CN V: Facial sensation intact bilaterally to fine touch. Masseter clench strong. Jaw jerk***. CN VII: Facial muscles symmetric and strong. No ptosis at rest or after sustained upgaze***. CN VIII: Hears finger rub well bilaterally. CN IX: No hypophonia. CN X: Palate elevates symmetrically. CN XI: Full strength shoulder shrug bilaterally. CN XII: Tongue protrusion full and midline. No atrophy or fasciculations. No significant dysarthria*** Motor: Tone is ***. *** fasciculations in *** extremities. *** atrophy. No grip or percussive myotonia.  Individual muscle group testing (MRC grade out of 5):  Movement     Neck flexion ***    Neck extension ***     Right Left   Shoulder abduction *** ***   Shoulder adduction *** ***   Shoulder ext rotation *** ***   Shoulder int rotation *** ***   Elbow flexion *** ***   Elbow extension *** ***   Wrist extension *** ***   Wrist flexion *** ***   Finger abduction - FDI *** ***   Finger abduction - ADM *** ***   Finger extension *** ***   Finger distal flexion - 2/3 *** ***   Finger distal flexion - 4/5 *** ***   Thumb flexion - FPL *** ***   Thumb abduction - APB *** ***    Hip flexion *** ***   Hip extension *** ***   Hip adduction *** ***   Hip abduction *** ***   Knee extension *** ***   Knee flexion *** ***   Dorsiflexion *** ***   Plantarflexion *** ***   Inversion *** ***   Eversion *** ***   Great toe extension *** ***   Great toe flexion *** ***     Reflexes:  Right Left  Bicep *** ***  Tricep *** ***  BrRad *** ***  Knee *** ***  Ankle *** ***   Pathological Reflexes: Babinski: *** response bilaterally*** Hoffman: ***  Troemner: *** Pectoral: *** Palmomental:  *** Facial: *** Midline tap: *** Sensation: Pinprick: *** Vibration: *** Temperature: *** Proprioception: *** Coordination: Intact finger-to- nose-finger and heel-to-shin bilaterally. Romberg negative.*** Gait: Able to rise from chair with arms crossed unassisted. Normal, narrow-based gait. Able to tandem walk. Able to walk on toes and heels.***   Lab and Test Review: New results: 03/30/24: ANA positive (1:40) Vit E wnl Copper  wnl IFE: no M protein B1 elevated to 43  Previously reviewed results: 01/14/24: Ferritin 36.8 B12: > 1500 Folate wnl Vit D wnl TSH wnl Lipid panel: tChol 143, LDL 45, TG 220 CMP significant for Cr 1.29, BUN 33, AST 43, ALT 26 CBC w/ diff unremarkable   Tissue transglutaminase, IgA (01/13/23): negative HbA1c (12/15/22): 5.4   Imaging/Procedures: CT head wo contrast (01/14/24): FINDINGS: Brain: No intracranial hemorrhage, mass effect, or midline shift. No hydrocephalus. The basilar cisterns are patent. No evidence of territorial infarct or acute ischemia. No extra-axial or intracranial fluid collection.   Vascular: No hyperdense vessel or unexpected calcification.   Skull: No fracture or focal lesion.   Sinuses/Orbits: Paranasal sinuses and mastoid air cells are clear. The visualized orbits are unremarkable.   Other: None.   IMPRESSION: Negative noncontrast head CT.  ASSESSMENT: This is Katie Cox, a 69 y.o. female with:  ***  Plan: ***  Return to clinic in ***  Total time spent reviewing records, interview, history/exam, documentation, and coordination of care on day of encounter:  *** min  Katie Potters, MD     [1]  Allergies Allergen Reactions   Alprazolam Other (See Comments)     Unknown reaction per pt   Lexapro  [Escitalopram ]     Diarrhea/nausea   [2]  Social History Tobacco Use   Smoking status: Never   Smokeless tobacco: Never  Vaping Use   Vaping status: Never Used  Substance Use Topics   Alcohol use: No    Drug use: No   "

## 2024-07-07 ENCOUNTER — Ambulatory Visit: Admitting: Neurology

## 2024-07-15 NOTE — Progress Notes (Unsigned)
 "  I saw Katie Cox in neurology clinic on 07/07/24 in follow up for imbalance and headaches.  HPI: Katie Cox is a 69 y.o. year old female with a history of HLD, gout, GERD, IBS, anxiety, vit D deficiency, and OSA who we last saw on 03/30/24.  To briefly review: 03/30/24: Patient is most concerned about balance. Patient has difficulty with balance for the last year. She is not falling, but will lose balance and run into things. She has numbness and tingling in her feet. Her feet burn if she is very active. She was diagnosed with neuropathy about 10 years ago by EMG. She does not remember where this was done. She denies significant neck or back pain.   She has occasional numbness and tingling in her hands, mostly when waking from sleep.   She denies significant dry eyes. She has dry mouth, particularly in the morning. She has recently been diagnosed with sleep apnea and will be starting CPAP soon. She has occasional joint pain.   She has never been on neuropathic pain medication.   She takes B12, zinc, and Omega-3.    Patient has headaches that started about 6 months ago. She has 2-3 headaches per week. The pain is located around her forehead. It is an achy sensation, about 5/10. They typically last all day. She denies photophobia, phonophobia, or vomiting. She may occasionally has nausea. She thinks she stays hydrated. They are random. They may come more when she is at the computer. She has not noticed neck or shoulder tightness. She will take tylenol  when headaches are bad, maybe 2-3 times per week. This is ease the pain but not completely get rid of the headache. She wonders if headaches are related to stress. She does endorse a good bit of stress.   Patient is on Buspar  as needed for anxiety (takes once daily).   Smoker: No OCP/hormone use: No Caffiene use: 1 cup of decaf coffee daily EtOH use: No Restrictive diet: No Family history of neurologic disease including headaches:  No  Most recent Assessment and Plan (03/30/24): Katie Cox is a 69 y.o. female who presents for evaluation of imbalance and headaches. She has a relevant medical history of HLD, gout, GERD, IBS, anxiety, vit D deficiency, and OSA. Her neurological examination is pertinent for diminished sensation in bilateral lower extremities in a length dependent fashion and mild gait ataxia. She also has very tight and tender trapezius and paraspinal muscles despite not endorsing much neck pain. Available diagnostic data is significant for normal CT head, normal B12, and HbA1c of 5.4. Patient's numbness and tingling in the legs with diminished sensation on examination is consistent with a distal symmetric polyneuropathy. She has no known risk factors for PN. This is likely the cause of her imbalance though. I will check labs to look for treatable causes. Her headaches sound most consistent with tension headaches, likely from cervicalgia. She would like to try physical therapy for this. I will also start gabapentin  which could help neuropathic pain and headaches.    PLAN: -Blood work: B1, IFE, copper , vit E, ANA -Start gabapentin  300 mg at bedtime  -Lidocaine  cream PRN -Alpha lipoic acid 600 mg once or twice daily -Physical therapy for cervicalgia -OTC recommendations for headaches given in AVS  Since their last visit: Labs were unremarkable. ANA was positive at low titer (1:40).  Symptoms? Headaches? PT? Gabapentin ?  ROS: Pertinent positive and negative systems reviewed in HPI. ***   MEDICATIONS:  Outpatient  Encounter Medications as of 07/27/2024  Medication Sig Note   Acetaminophen  (TYLENOL  PO) Take by mouth. (Patient taking differently: Take by mouth as needed.)    allopurinol  (ZYLOPRIM ) 300 MG tablet TAKE 1 TABLET BY MOUTH  DAILY FOR GOUT PREVENTION    Ascorbic Acid (VITAMIN C  WITH ROSE HIPS) 1000 MG tablet Take 1,000 mg by mouth daily.    aspirin  81 MG tablet Take 81 mg by mouth every other day.   01/25/2014: Takes every other day   bumetanide  (BUMEX ) 2 MG tablet TAKE 1 TABLET BY MOUTH TWICE  DAILY FOR BLOOD PRESSURE AND  FLUID    busPIRone  (BUSPAR ) 5 MG tablet TAKE 1 TABLET BY MOUTH UP TO 3  TIMES DAILY AS NEEDED FOR  ANXIETY    Cholecalciferol (VITAMIN D3) 125 MCG (5000 UT) CAPS Take by mouth daily.    Cyanocobalamin  (B-12 PO) Take by mouth daily.    famotidine  (PEPCID ) 40 MG tablet TAKE 1 TABLET BY MOUTH AT  BEDTIME TO PREVENT  INDIGESTION AND HEARTBURN    fluticasone  (FLONASE ) 50 MCG/ACT nasal spray Place 1 spray into both nostrils daily. (Patient taking differently: Place 1 spray into both nostrils daily as needed.) 04/21/2018: OTC    gabapentin  (NEURONTIN ) 300 MG capsule Take 1 capsule (300 mg total) by mouth at bedtime.    magnesium  gluconate (MAGONATE) 500 MG tablet Take 500 mg by mouth 2 (two) times daily. (Patient taking differently: Take 500 mg by mouth daily.)    Multiple Vitamin (MULTIVITAMIN) tablet Take 1 tablet by mouth daily.    omega-3 acid ethyl esters (LOVAZA) 1 g capsule Take 1 capsule by mouth daily.    OVER THE COUNTER MEDICATION Takes OTC Allegra for allergies.    potassium chloride  SA (KLOR-CON  M) 20 MEQ tablet TAKE 1 TABLET BY MOUTH TWICE  DAILY    rosuvastatin  (CRESTOR ) 20 MG tablet TAKE 1 TABLET BY MOUTH DAILY    Zinc 50 MG CAPS Take by mouth daily.    No facility-administered encounter medications on file as of 07/27/2024.    PAST MEDICAL HISTORY: Past Medical History:  Diagnosis Date   Abnormal glucose 02/05/2015   Allergy    seasonal   Anxiety    Contact lens/glasses fitting    wears contacts or glasses   Diverticulosis    GERD (gastroesophageal reflux disease)    Hyperlipidemia    Hyperuricemia 08/13/2014   Irritable bowel syndrome 10/18/2007   Irritable bowel syndrome (IBS)    Medication management 12/26/2013   OSA (obstructive sleep apnea)    Peripheral neuropathy    in feet   POLYP, COLON 10/18/2007   PONV (postoperative nausea and  vomiting)    nausea, slow to wake up at times   Vitamin D  deficiency     PAST SURGICAL HISTORY: Past Surgical History:  Procedure Laterality Date   carpel tunnel surgery Right    CHOLECYSTECTOMY  04/14/2012   Procedure: LAPAROSCOPIC CHOLECYSTECTOMY;  Surgeon: Donnice Bury, MD;  Location: [facility];  Service: General;  Laterality: N/A;   COLONOSCOPY     EYE SURGERY     at age 64yr   FOOT SURGERY     corn removal both feet - little toes on each foot   FRACTURE SURGERY     goiter removed fro thyroid      OPEN REDUCTION INTERNAL FIXATION (ORIF) DISTAL RADIAL FRACTURE Right 07/04/2015   Procedure: OPEN REDUCTION INTERNAL FIXATION (ORIF) RIGHT DISTAL RADIUS FRACTURE;  Surgeon: Prentice Pagan, MD;  Location: [facility];  Service: Orthopedics;  Laterality: Right;   POLYPECTOMY     ROTATOR CUFF REPAIR Right 2023   WISDOM TOOTH EXTRACTION      ALLERGIES: Allergies[1]  FAMILY HISTORY: Family History  Problem Relation Age of Onset   Hypertension Mother    Arrhythmia Mother    Colon cancer Father 48   Colon polyps Father    Cancer Father    Macular degeneration Brother    COPD Paternal Grandfather    Breast cancer Maternal Aunt    Esophageal cancer Neg Hx    Rectal cancer Neg Hx    Stomach cancer Neg Hx     SOCIAL HISTORY: Social History[2] Social History   Social History Narrative   Are you right handed or left handed? Right   Are you currently employed ?    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 yes   Who lives with you?    What type of home do you live in: 1 story or 2 story? one    Caffiene occas    Objective:  Vital Signs:  There were no vitals taken for this visit.  General:*** General appearance: Awake and alert. No distress. Cooperative with exam.  Skin: No obvious rash or jaundice. HEENT: Atraumatic. Anicteric. Lungs: Non-labored breathing on room air  Heart: Regular Abdomen: Soft, non tender. Extremities:  No edema. No obvious deformity.  Musculoskeletal: No obvious joint swelling.  Neurological: Mental Status: Alert. Speech fluent. No pseudobulbar affect Cranial Nerves: CNII: No RAPD. Visual fields intact. CNIII, IV, VI: PERRL. No nystagmus. EOMI. CN V: Facial sensation intact bilaterally to fine touch. Masseter clench strong. Jaw jerk***. CN VII: Facial muscles symmetric and strong. No ptosis at rest or after sustained upgaze***. CN VIII: Hears finger rub well bilaterally. CN IX: No hypophonia. CN X: Palate elevates symmetrically. CN XI: Full strength shoulder shrug bilaterally. CN XII: Tongue protrusion full and midline. No atrophy or fasciculations. No significant dysarthria*** Motor: Tone is ***. *** fasciculations in *** extremities. *** atrophy. No grip or percussive myotonia.  Individual muscle group testing (MRC grade out of 5):  Movement     Neck flexion ***    Neck extension ***     Right Left   Shoulder abduction *** ***   Shoulder adduction *** ***   Shoulder ext rotation *** ***   Shoulder int rotation *** ***   Elbow flexion *** ***   Elbow extension *** ***   Wrist extension *** ***   Wrist flexion *** ***   Finger abduction - FDI *** ***   Finger abduction - ADM *** ***   Finger extension *** ***   Finger distal flexion - 2/3 *** ***   Finger distal flexion - 4/5 *** ***   Thumb flexion - FPL *** ***   Thumb abduction - APB *** ***    Hip flexion *** ***   Hip extension *** ***   Hip adduction *** ***   Hip abduction *** ***   Knee extension *** ***   Knee flexion *** ***   Dorsiflexion *** ***   Plantarflexion *** ***   Inversion *** ***   Eversion *** ***   Great toe extension *** ***   Great toe flexion *** ***     Reflexes:  Right Left  Bicep *** ***  Tricep *** ***  BrRad *** ***  Knee *** ***  Ankle *** ***   Pathological Reflexes: Babinski: *** response bilaterally*** Hoffman: ***  Troemner: *** Pectoral: *** Palmomental:  *** Facial: *** Midline tap: *** Sensation: Pinprick: *** Vibration: *** Temperature: *** Proprioception: *** Coordination: Intact finger-to- nose-finger and heel-to-shin bilaterally. Romberg negative.*** Gait: Able to rise from chair with arms crossed unassisted. Normal, narrow-based gait. Able to tandem walk. Able to walk on toes and heels.***   Lab and Test Review: New results: 03/30/24: ANA positive (1:40) Vit E wnl Copper  wnl IFE: no M protein B1 elevated to 43  Previously reviewed results: 01/14/24: Ferritin 36.8 B12: > 1500 Folate wnl Vit D wnl TSH wnl Lipid panel: tChol 143, LDL 45, TG 220 CMP significant for Cr 1.29, BUN 33, AST 43, ALT 26 CBC w/ diff unremarkable   Tissue transglutaminase, IgA (01/13/23): negative HbA1c (12/15/22): 5.4   Imaging/Procedures: CT head wo contrast (01/14/24): FINDINGS: Brain: No intracranial hemorrhage, mass effect, or midline shift. No hydrocephalus. The basilar cisterns are patent. No evidence of territorial infarct or acute ischemia. No extra-axial or intracranial fluid collection.   Vascular: No hyperdense vessel or unexpected calcification.   Skull: No fracture or focal lesion.   Sinuses/Orbits: Paranasal sinuses and mastoid air cells are clear. The visualized orbits are unremarkable.   Other: None.   IMPRESSION: Negative noncontrast head CT.  ASSESSMENT: This is Katie Cox, a 69 y.o. female with:  ***  Plan: ***  Return to clinic in ***  Total time spent reviewing records, interview, history/exam, documentation, and coordination of care on day of encounter:  *** min  Venetia Potters, MD     [1]  Allergies Allergen Reactions   Alprazolam Other (See Comments)     Unknown reaction per pt   Lexapro  [Escitalopram ]     Diarrhea/nausea   [2]  Social History Tobacco Use   Smoking status: Never   Smokeless tobacco: Never  Vaping Use   Vaping status: Never Used  Substance Use Topics   Alcohol use: No    Drug use: No   "

## 2024-07-15 NOTE — Progress Notes (Incomplete)
 "  Established Patient Office Visit Subjective:  Patient ID: Katie Cox, female    DOB: 11-12-1955  Age: 69 y.o. MRN: [id number]  CC: No chief complaint on file.     Katie Cox is here for routine follow up.    Hypertension: - Medications: Bumetanide  2 mg BID. - Compliance: *** - Checking BP at home: *** - Denies any SOB, recurrent headaches, CP, vision changes, LE edema, dizziness, palpitations, or medication side effects. - Diet: *** - Exercise: *** - Stressors: BP Readings from Last 3 Encounters:  05/23/24 124/66  03/30/24 119/60  03/29/24 131/70   Lab Results  Component Value Date   CREATININE 1.29 (H) 01/14/2024    Hyperlipidemia: - medications: Rosuvastatin  20 mg daily, Lovaza 1 g daily. - compliance: *** - medication SEs: *** The 10-year ASCVD risk score (Arnett DK, et al., 2019) is: 9.6%   Values used to calculate the score:     Age: 51 years     Clinically relevant sex: Female     Is Non-Hispanic African American: No     Diabetic: No     Tobacco smoker: No     Systolic Blood Pressure: 124 mmHg     Is BP treated: Yes     HDL Cholesterol: 53.7 mg/dL     Total Cholesterol: 143 mg/dL  Mood follow-up: - Diagnosis: Anxiety - Treatment: Buspar  5 mg as needed. - Medication side effects:  - SI/HI:  - Update:    03/29/2024    3:27 PM 01/14/2024    3:28 PM 08/24/2023    3:48 PM 03/24/2023   11:35 AM 06/20/2022   11:12 AM  Depression screen PHQ 2/9  Decreased Interest 0 0 0 0 0  Down, Depressed, Hopeless 0 0 0 0 0  PHQ - 2 Score 0 0 0 0 0  Altered sleeping 0 0 0    Tired, decreased energy 2 3 2     Change in appetite 0 3 0    Feeling bad or failure about yourself  1 1 0    Trouble concentrating 0 0 0    Moving slowly or fidgety/restless 0 0 0    Suicidal thoughts 0 0 0    PHQ-9 Score 3  7  2      Difficult doing work/chores Not difficult at all Not difficult at all Not difficult at all       Data saved with a previous flowsheet row definition       01/14/2024    3:28 PM 08/24/2023    3:49 PM  GAD 7 : Generalized Anxiety Score  Nervous, Anxious, on Edge 2  0   Control/stop worrying 3  0   Worry too much - different things 3  2   Trouble relaxing 2  0   Restless 1  0   Easily annoyed or irritable 1  0   Afraid - awful might happen 0  0   Total GAD 7 Score 12 2  Anxiety Difficulty Not difficult at all Not difficult at all     Data saved with a previous flowsheet row definition      Past Medical History:  Diagnosis Date   Abnormal glucose 02/05/2015   Allergy    seasonal   Anxiety    Contact lens/glasses fitting    wears contacts or glasses   Diverticulosis    GERD (gastroesophageal reflux disease)    Hyperlipidemia    Hyperuricemia 08/13/2014   Irritable bowel syndrome 10/18/2007  Irritable bowel syndrome (IBS)    Medication management 12/26/2013   OSA (obstructive sleep apnea)    Peripheral neuropathy    in feet   POLYP, COLON 10/18/2007   PONV (postoperative nausea and vomiting)    nausea, slow to wake up at times   Vitamin D  deficiency     Past Surgical History:  Procedure Laterality Date   carpel tunnel surgery Right    CHOLECYSTECTOMY  04/14/2012   Procedure: LAPAROSCOPIC CHOLECYSTECTOMY;  Surgeon: Donnice Bury, MD;  Location: [facility];  Service: General;  Laterality: N/A;   COLONOSCOPY     EYE SURGERY     at age 64yr   FOOT SURGERY     corn removal both feet - little toes on each foot   FRACTURE SURGERY     goiter removed fro thyroid      OPEN REDUCTION INTERNAL FIXATION (ORIF) DISTAL RADIAL FRACTURE Right 07/04/2015   Procedure: OPEN REDUCTION INTERNAL FIXATION (ORIF) RIGHT DISTAL RADIUS FRACTURE;  Surgeon: Prentice Pagan, MD;  Location: [facility];  Service: Orthopedics;  Laterality: Right;   POLYPECTOMY     ROTATOR CUFF REPAIR Right 2023   WISDOM TOOTH EXTRACTION      Family History  Problem Relation Age of Onset   Hypertension Mother    Arrhythmia Mother    Colon cancer  Father 2   Colon polyps Father    Cancer Father    Macular degeneration Brother    COPD Paternal Grandfather    Breast cancer Maternal Aunt    Esophageal cancer Neg Hx    Rectal cancer Neg Hx    Stomach cancer Neg Hx     Social History   Socioeconomic History   Marital status: Divorced    Spouse name: Not on file   Number of children: 1   Years of education: Not on file   Highest education level: 12th grade  Occupational History   Occupation: accounts payable clerk  Tobacco Use   Smoking status: Never   Smokeless tobacco: Never  Vaping Use   Vaping status: Never Used  Substance and Sexual Activity   Alcohol use: No   Drug use: No   Sexual activity: Not Currently    Birth control/protection: Post-menopausal  Other Topics Concern   Not on file  Social History Narrative   Are you right handed or left handed? Right   Are you currently employed ?    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 yes   Who lives with you?    What type of home do you live in: 1 story or 2 story? one    Caffiene occas   Social Drivers of Health   Tobacco Use: Low Risk (05/23/2024)   Patient History    Smoking Tobacco Use: Never    Smokeless Tobacco Use: Never    Passive Exposure: Not on file  Financial Resource Strain: Low Risk (03/29/2024)   Overall Financial Resource Strain (CARDIA)    Difficulty of Paying Living Expenses: Not very hard  Food Insecurity: No Food Insecurity (03/29/2024)   Epic    Worried About Programme Researcher, Broadcasting/film/video in the Last Year: Never true    Ran Out of Food in the Last Year: Never true  Transportation Needs: No Transportation Needs (03/29/2024)   Epic    Lack of Transportation (Medical): No    Lack of Transportation (Non-Medical): No  Physical Activity: Sufficiently Active (03/29/2024)   Exercise Vital Sign  Days of Exercise per Week: 5 days    Minutes of Exercise per Session: 60 min  Recent Concern: Physical Activity -  Insufficiently Active (01/13/2024)   Exercise Vital Sign    Days of Exercise per Week: 2 days    Minutes of Exercise per Session: 30 min  Stress: Stress Concern Present (03/29/2024)   [employer] of Occupational Health - Occupational Stress Questionnaire    Feeling of Stress: To some extent  Social Connections: Moderately Isolated (03/29/2024)   Social Connection and Isolation Panel    Frequency of Communication with Friends and Family: Twice a week    Frequency of Social Gatherings with Friends and Family: Once a week    Attends Religious Services: More than 4 times per year    Active Member of [employer] or Organizations: No    Attends Banker Meetings: Never    Marital Status: Divorced  Catering Manager Violence: Not At Risk (03/29/2024)   Epic    Fear of Current or Ex-Partner: No    Emotionally Abused: No    Physically Abused: No    Sexually Abused: No  Depression (PHQ2-9): Low Risk (03/29/2024)   Depression (PHQ2-9)    PHQ-2 Score: 3  Recent Concern: Depression (PHQ2-9) - Medium Risk (01/14/2024)   Depression (PHQ2-9)    PHQ-2 Score: 7  Alcohol Screen: Low Risk (03/29/2024)   Alcohol Screen    Last Alcohol Screening Score (AUDIT): 0  Housing: Low Risk (03/29/2024)   Epic    Unable to Pay for Housing in the Last Year: No    Number of Times Moved in the Last Year: 0    Homeless in the Last Year: No  Utilities: Not At Risk (03/29/2024)   Epic    Threatened with loss of utilities: No  Health Literacy: Adequate Health Literacy (03/29/2024)   B1300 Health Literacy    Frequency of need for help with medical instructions: Never    ROS All ROS negative except what is listed in the Katie.   Objective:   Today's Vitals: There were no vitals taken for this visit.  Physical Exam  Assessment & Plan:   Problem List Items Addressed This Visit   None     Follow-up: No follow-ups on file.   Waddell FURY Almarie, DNP, FNP-C  I,Emily Lagle,acting as a neurosurgeon for Waddell KATHEE Almarie, NP.,have documented all relevant documentation on the behalf of Waddell KATHEE Almarie, NP.  I, Waddell KATHEE Almarie, NP, have reviewed all documentation for this visit. The documentation on 07/18/2024 for the exam, diagnosis, procedures, and orders are all accurate and complete.  "

## 2024-07-18 ENCOUNTER — Ambulatory Visit: Admitting: Family Medicine

## 2024-07-27 ENCOUNTER — Ambulatory Visit: Admitting: Neurology

## 2024-07-28 ENCOUNTER — Ambulatory Visit: Admitting: Family Medicine

## 2024-07-28 ENCOUNTER — Encounter: Payer: Self-pay | Admitting: Family Medicine

## 2024-07-28 VITALS — BP 129/78 | HR 54 | Ht 68.0 in | Wt 158.4 lb

## 2024-07-28 DIAGNOSIS — F419 Anxiety disorder, unspecified: Secondary | ICD-10-CM

## 2024-07-28 DIAGNOSIS — G4719 Other hypersomnia: Secondary | ICD-10-CM

## 2024-07-28 DIAGNOSIS — R4189 Other symptoms and signs involving cognitive functions and awareness: Secondary | ICD-10-CM

## 2024-07-28 DIAGNOSIS — I10 Essential (primary) hypertension: Secondary | ICD-10-CM

## 2024-07-28 DIAGNOSIS — N1831 Chronic kidney disease, stage 3a: Secondary | ICD-10-CM

## 2024-07-28 DIAGNOSIS — E782 Mixed hyperlipidemia: Secondary | ICD-10-CM

## 2024-07-28 NOTE — Assessment & Plan Note (Signed)
 Following with [facility]  -Repeat renal function tests today.

## 2024-07-28 NOTE — Progress Notes (Signed)
 "  Established Patient Office Visit Subjective:  Patient ID: Katie Cox, female    DOB: 12-30-1955  Age: 69 y.o. MRN: [id number]  CC:  Chief Complaint  Patient presents with   Follow-up    6 month follow up      HPI Katie Cox is here for routine follow up.  Discussed the use of AI scribe software for clinical note transcription with the patient, who gave verbal consent to proceed.  History of Present Illness Katie Cox is a 69 year old female who presents for a regular follow-up visit.  She has been experiencing increased anxiety despite being on Buspar , which she takes once daily or PRN, although it is prescribed up to three times a day. She attributes some of her anxiety to recent life changes, including moving to [place] to be closer to her son and grandchildren. She describes feeling 'really restless a lot lately.'  She has a history of sleep apnea and uses a CPAP machine. She reports issues with the machine, noting that it sometimes records minimal usage despite her wearing it from 9 PM to 5 AM. Adjusting to the CPAP mask has been challenging for her.  She is currently taking Bumex  (bumetanide ) 2 mg once daily due to concerns about kidney function. She has an upcoming appointment with her kidney doctor in March. She also takes rosuvastatin  20 mg and is currently out of omega-3 (Lovaza) supplements. Her blood pressure at home seems fine, with the last reading at 129/78 mmHg.  She takes potassium twice daily, magnesium  once daily, famotidine  (Pepcid ) at night, vitamin D , B12, gabapentin  at bedtime, a multivitamin, and zinc. She has a 90-day supply of her medications.  Her son has expressed concern about her memory, noting she forgets things he tells her. She underwent a memory test with a previous doctor, which involved questionnaires but no formal testing, about one to two years ago. She is currently seeing a neurologist for balance issues.     Hypertension: -  Medications: Bumetanide  2 mg daily - Compliance: good - Checking BP at home: stable - Denies any SOB, recurrent headaches, CP, vision changes, LE edema, dizziness, palpitations, or medication side effects. BP Readings from Last 3 Encounters:  07/28/24 129/78  05/23/24 124/66  03/30/24 119/60    Hyperlipidemia: - medications: Rosuvastatin  20 mg daily, Lovaza 1 g daily.  - compliance: good - medication SEs: none The 10-year ASCVD risk score (Arnett DK, et al., 2019) is: 10.3%   Values used to calculate the score:     Age: 64 years     Clinically relevant sex: Female     Is Non-Hispanic African American: No     Diabetic: No     Tobacco smoker: No     Systolic Blood Pressure: 129 mmHg     Is BP treated: Yes     HDL Cholesterol: 53.7 mg/dL     Total Cholesterol: 143 mg/dL   Chronic Kidney Disease: - Following with [facility] Lab Results  Component Value Date   NA 142 01/14/2024   CL 103 01/14/2024   K 4.3 01/14/2024   CO2 22 01/14/2024   BUN 33 (H) 01/14/2024   CREATININE 1.29 (H) 01/14/2024   GFR 42.45 (L) 01/14/2024   CALCIUM  9.7 01/14/2024   ALBUMIN 4.5 01/14/2024   GLUCOSE 102 (H) 01/14/2024    Past Medical History:  Diagnosis Date   Abnormal glucose 02/05/2015   Allergy    seasonal   Anxiety  Contact lens/glasses fitting    wears contacts or glasses   Diverticulosis    GERD (gastroesophageal reflux disease)    Hyperlipidemia    Hyperuricemia 08/13/2014   Irritable bowel syndrome 10/18/2007   Irritable bowel syndrome (IBS)    Medication management 12/26/2013   OSA (obstructive sleep apnea)    Peripheral neuropathy    in feet   POLYP, COLON 10/18/2007   PONV (postoperative nausea and vomiting)    nausea, slow to wake up at times   Vitamin D  deficiency     Past Surgical History:  Procedure Laterality Date   carpel tunnel surgery Right    CHOLECYSTECTOMY  04/14/2012   Procedure: LAPAROSCOPIC CHOLECYSTECTOMY;  Surgeon: Donnice Bury, MD;   Location: [facility];  Service: General;  Laterality: N/A;   COLONOSCOPY     EYE SURGERY     at age 64yr   FOOT SURGERY     corn removal both feet - little toes on each foot   FRACTURE SURGERY     goiter removed fro thyroid      OPEN REDUCTION INTERNAL FIXATION (ORIF) DISTAL RADIAL FRACTURE Right 07/04/2015   Procedure: OPEN REDUCTION INTERNAL FIXATION (ORIF) RIGHT DISTAL RADIUS FRACTURE;  Surgeon: Prentice Pagan, MD;  Location: [facility];  Service: Orthopedics;  Laterality: Right;   POLYPECTOMY     ROTATOR CUFF REPAIR Right 2023   WISDOM TOOTH EXTRACTION      Family History  Problem Relation Age of Onset   Hypertension Mother    Arrhythmia Mother    Colon cancer Father 70   Colon polyps Father    Cancer Father    Macular degeneration Brother    COPD Paternal Grandfather    Breast cancer Maternal Aunt    Esophageal cancer Neg Hx    Rectal cancer Neg Hx    Stomach cancer Neg Hx     Social History   Socioeconomic History   Marital status: Divorced    Spouse name: Not on file   Number of children: 1   Years of education: Not on file   Highest education level: 12th grade  Occupational History   Occupation: accounts payable clerk  Tobacco Use   Smoking status: Never   Smokeless tobacco: Never  Vaping Use   Vaping status: Never Used  Substance and Sexual Activity   Alcohol use: No   Drug use: No   Sexual activity: Not Currently    Birth control/protection: Post-menopausal  Other Topics Concern   Not on file  Social History Narrative   Are you right handed or left handed? Right   Are you currently employed ?    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 yes   Who lives with you?    What type of home do you live in: 1 story or 2 story? one    Caffiene occas   Social Drivers of Health   Tobacco Use: Low Risk (07/28/2024)   Patient History    Smoking Tobacco Use: Never    Smokeless Tobacco Use: Never    Passive Exposure: Not  on file  Financial Resource Strain: Low Risk (03/29/2024)   Overall Financial Resource Strain (CARDIA)    Difficulty of Paying Living Expenses: Not very hard  Food Insecurity: No Food Insecurity (03/29/2024)   Epic    Worried About Radiation Protection Practitioner of Food in the Last Year: Never true    Ran Out of Food in the Last Year: Never true  Transportation Needs: No Transportation Needs (03/29/2024)   Epic    Lack of Transportation (Medical): No    Lack of Transportation (Non-Medical): No  Physical Activity: Sufficiently Active (03/29/2024)   Exercise Vital Sign    Days of Exercise per Week: 5 days    Minutes of Exercise per Session: 60 min  Recent Concern: Physical Activity - Insufficiently Active (01/13/2024)   Exercise Vital Sign    Days of Exercise per Week: 2 days    Minutes of Exercise per Session: 30 min  Stress: Stress Concern Present (03/29/2024)   [employer] of Occupational Health - Occupational Stress Questionnaire    Feeling of Stress: To some extent  Social Connections: Moderately Isolated (03/29/2024)   Social Connection and Isolation Panel    Frequency of Communication with Friends and Family: Twice a week    Frequency of Social Gatherings with Friends and Family: Once a week    Attends Religious Services: More than 4 times per year    Active Member of [employer] or Organizations: No    Attends Banker Meetings: Never    Marital Status: Divorced  Catering Manager Violence: Not At Risk (03/29/2024)   Epic    Fear of Current or Ex-Partner: No    Emotionally Abused: No    Physically Abused: No    Sexually Abused: No  Depression (PHQ2-9): Medium Risk (07/28/2024)   Depression (PHQ2-9)    PHQ-2 Score: 6  Alcohol Screen: Low Risk (03/29/2024)   Alcohol Screen    Last Alcohol Screening Score (AUDIT): 0  Housing: Low Risk (03/29/2024)   Epic    Unable to Pay for Housing in the Last Year: No    Number of Times Moved in the Last Year: 0    Homeless in the Last Year: No   Utilities: Not At Risk (03/29/2024)   Epic    Threatened with loss of utilities: No  Health Literacy: Adequate Health Literacy (03/29/2024)   B1300 Health Literacy    Frequency of need for help with medical instructions: Never    ROS All ROS negative except what is listed in the HPI.   Objective:   Today's Vitals: BP 129/78 (BP Location: Right Arm, Patient Position: Sitting, Cuff Size: Normal)   Pulse (!) 54   Ht 5' 8 (1.727 m)   Wt 158 lb 6.4 oz (71.8 kg)   SpO2 100%   BMI 24.08 kg/m   Physical Exam Vitals reviewed.  Constitutional:      Appearance: Normal appearance.  Eyes:     Comments: Mild exotropia (L eye)  Cardiovascular:     Rate and Rhythm: Normal rate and regular rhythm.  Pulmonary:     Effort: Pulmonary effort is normal.     Breath sounds: Normal breath sounds.  Musculoskeletal:     Right lower leg: No edema.     Left lower leg: No edema.  Skin:    General: Skin is warm and dry.  Neurological:     Mental Status: She is alert and oriented to person, place, and time.  Psychiatric:        Mood and Affect: Mood normal.        Behavior: Behavior normal.        Thought Content: Thought content normal.        Judgment: Judgment normal.        Assessment & Plan:   Problem List Items Addressed This Visit       Active Problems   Hyperlipidemia, mixed -  Primary (Chronic)   Medication management: rosuvastatin  20 mg daily Lifestyle factors for lowering cholesterol include: Diet therapy - heart-healthy diet rich in fruits, veggies, fiber-rich whole grains, lean meats, chicken, fish (at least twice a week), fat-free or 1% dairy products; foods low in saturated/trans fats, cholesterol, sodium, and sugar. Mediterranean diet has shown to be very heart healthy. Regular exercise - recommend at least 30 minutes a day, 5 times per week Weight management  Repeat CMP and lipid panel today       Relevant Orders   Comprehensive metabolic panel with GFR   Lipid  panel   Anxiety (Chronic)   Chronic condition, managed with Buspar , but not taking consistently. No SI/HI. PHQ9/GAD7 reviewed.  -Recommend taking scheduled twice daily Buspar  and see how she responds in the next few weeks. We can adjust dose after that if needed.       CKD (chronic kidney disease) stage 3, GFR 30-59 ml/min (HCC) (Chronic)   Following with [facility]  -Repeat renal function tests today.      Essential hypertension   Blood pressure is at goal for age and co-morbidities.   Recommendations: continue bumetanide  2 mg daily - BP goal <130/80 - monitor and log blood pressures at home - check around the same time each day in a relaxed setting - Limit salt to <2000 mg/day - Follow DASH eating plan (heart healthy diet) - limit alcohol to 2 standard drinks per day for men and 1 per day for women - avoid tobacco products - get at least 2 hours of regular aerobic exercise weekly Patient aware of signs/symptoms requiring further/urgent evaluation. Labs updated today.       Relevant Orders   Comprehensive metabolic panel with GFR   CBC with Differential/Platelet   TSH   Other Visit Diagnoses       Concern about memory     Concerns about memory issues raised by her son.  - Stable MMSE today. Results above.  - Monitor symptoms and follow-up if needed. She is already established with neurology.         Follow-up: Return in about 6 months (around 01/25/2025) for chronic disease management.   Waddell FURY Almarie, DNP, FNP-C  I,Emily Lagle,acting as a neurosurgeon for Waddell KATHEE Almarie, NP.,have documented all relevant documentation on the behalf of Waddell KATHEE Almarie, NP.  I, Waddell KATHEE Almarie, NP, have reviewed all documentation for this visit. The documentation on 07/28/2024 for the exam, diagnosis, procedures, and orders are all accurate and complete.  "

## 2024-07-28 NOTE — Assessment & Plan Note (Signed)
Medication management: rosuvastatin 20 mg daily Lifestyle factors for lowering cholesterol include: Diet therapy - heart-healthy diet rich in fruits, veggies, fiber-rich whole grains, lean meats, chicken, fish (at least twice a week), fat-free or 1% dairy products; foods low in saturated/trans fats, cholesterol, sodium, and sugar. Mediterranean diet has shown to be very heart healthy. Regular exercise - recommend at least 30 minutes a day, 5 times per week Weight management  Repeat CMP and lipid panel today

## 2024-07-28 NOTE — Assessment & Plan Note (Signed)
 Chronic condition, managed with Buspar , but not taking consistently. No SI/HI. PHQ9/GAD7 reviewed.  -Recommend taking scheduled twice daily Buspar  and see how she responds in the next few weeks. We can adjust dose after that if needed.

## 2024-07-28 NOTE — Assessment & Plan Note (Signed)
 Blood pressure is at goal for age and co-morbidities.   Recommendations: continue bumetanide  2 mg daily - BP goal <130/80 - monitor and log blood pressures at home - check around the same time each day in a relaxed setting - Limit salt to <2000 mg/day - Follow DASH eating plan (heart healthy diet) - limit alcohol to 2 standard drinks per day for men and 1 per day for women - avoid tobacco products - get at least 2 hours of regular aerobic exercise weekly Patient aware of signs/symptoms requiring further/urgent evaluation. Labs updated today.

## 2024-07-29 ENCOUNTER — Ambulatory Visit: Payer: Self-pay | Admitting: Family Medicine

## 2024-07-29 LAB — CBC WITH DIFFERENTIAL/PLATELET
Basophils Absolute: 0.1 10*3/uL (ref 0.0–0.1)
Basophils Relative: 0.9 % (ref 0.0–3.0)
Eosinophils Absolute: 0.1 10*3/uL (ref 0.0–0.7)
Eosinophils Relative: 1.5 % (ref 0.0–5.0)
HCT: 41.2 % (ref 36.0–46.0)
Hemoglobin: 14.2 g/dL (ref 12.0–15.0)
Lymphocytes Relative: 38.7 % (ref 12.0–46.0)
Lymphs Abs: 3.3 10*3/uL (ref 0.7–4.0)
MCHC: 34.5 g/dL (ref 30.0–36.0)
MCV: 96.2 fl (ref 78.0–100.0)
Monocytes Absolute: 0.6 10*3/uL (ref 0.1–1.0)
Monocytes Relative: 7.3 % (ref 3.0–12.0)
Neutro Abs: 4.5 10*3/uL (ref 1.4–7.7)
Neutrophils Relative %: 51.6 % (ref 43.0–77.0)
Platelets: 259 10*3/uL (ref 150.0–400.0)
RBC: 4.29 Mil/uL (ref 3.87–5.11)
RDW: 13 % (ref 11.5–15.5)
WBC: 8.7 10*3/uL (ref 4.0–10.5)

## 2024-07-29 LAB — COMPREHENSIVE METABOLIC PANEL WITH GFR
ALT: 22 U/L (ref 3–35)
AST: 31 U/L (ref 5–37)
Albumin: 4.5 g/dL (ref 3.5–5.2)
Alkaline Phosphatase: 66 U/L (ref 39–117)
BUN: 30 mg/dL — ABNORMAL HIGH (ref 6–23)
CO2: 28 meq/L (ref 19–32)
Calcium: 9.9 mg/dL (ref 8.4–10.5)
Chloride: 101 meq/L (ref 96–112)
Creatinine, Ser: 1.3 mg/dL — ABNORMAL HIGH (ref 0.40–1.20)
GFR: 41.9 mL/min — ABNORMAL LOW
Glucose, Bld: 86 mg/dL (ref 70–99)
Potassium: 3.7 meq/L (ref 3.5–5.1)
Sodium: 142 meq/L (ref 135–145)
Total Bilirubin: 1 mg/dL (ref 0.2–1.2)
Total Protein: 6.9 g/dL (ref 6.0–8.3)

## 2024-07-29 LAB — TSH: TSH: 1.63 u[IU]/mL (ref 0.35–5.50)

## 2024-07-29 LAB — LIPID PANEL
Cholesterol: 129 mg/dL (ref 28–200)
HDL: 48.5 mg/dL
LDL Cholesterol: 45 mg/dL (ref 10–99)
NonHDL: 80.2
Total CHOL/HDL Ratio: 3
Triglycerides: 175 mg/dL — ABNORMAL HIGH (ref 10.0–149.0)
VLDL: 35 mg/dL (ref 0.0–40.0)

## 2025-01-25 ENCOUNTER — Ambulatory Visit: Admitting: Family Medicine

## 2025-02-17 ENCOUNTER — Ambulatory Visit: Admitting: Neurology

## 2025-03-30 ENCOUNTER — Ambulatory Visit
